# Patient Record
Sex: Female | Born: 1965 | Race: Black or African American | Hispanic: No | Smoking: Current every day smoker
Health system: Southern US, Community
[De-identification: ages and names within clinical notes are randomized; demographics above are authoritative.]

## PROBLEM LIST (undated history)

## (undated) DIAGNOSIS — C07 Malignant neoplasm of parotid gland: Secondary | ICD-10-CM

## (undated) DIAGNOSIS — Z9289 Personal history of other medical treatment: Secondary | ICD-10-CM

## (undated) DIAGNOSIS — C089 Malignant neoplasm of major salivary gland, unspecified: Secondary | ICD-10-CM

## (undated) DIAGNOSIS — Z923 Personal history of irradiation: Secondary | ICD-10-CM

## (undated) DIAGNOSIS — F32A Depression, unspecified: Secondary | ICD-10-CM

## (undated) DIAGNOSIS — F329 Major depressive disorder, single episode, unspecified: Secondary | ICD-10-CM

## (undated) DIAGNOSIS — F419 Anxiety disorder, unspecified: Secondary | ICD-10-CM

## (undated) DIAGNOSIS — F17210 Nicotine dependence, cigarettes, uncomplicated: Secondary | ICD-10-CM

## (undated) DIAGNOSIS — H9190 Unspecified hearing loss, unspecified ear: Secondary | ICD-10-CM

## (undated) DIAGNOSIS — K219 Gastro-esophageal reflux disease without esophagitis: Secondary | ICD-10-CM

## (undated) DIAGNOSIS — R42 Dizziness and giddiness: Secondary | ICD-10-CM

## (undated) DIAGNOSIS — G4733 Obstructive sleep apnea (adult) (pediatric): Secondary | ICD-10-CM

## (undated) DIAGNOSIS — D649 Anemia, unspecified: Secondary | ICD-10-CM

## (undated) DIAGNOSIS — D219 Benign neoplasm of connective and other soft tissue, unspecified: Secondary | ICD-10-CM

## (undated) HISTORY — DX: Nicotine dependence, cigarettes, uncomplicated: F17.210

## (undated) HISTORY — DX: Obstructive sleep apnea (adult) (pediatric): G47.33

## (undated) HISTORY — DX: Depression, unspecified: F32.A

## (undated) HISTORY — DX: Anemia, unspecified: D64.9

## (undated) HISTORY — DX: Anxiety disorder, unspecified: F41.9

## (undated) HISTORY — DX: Gastro-esophageal reflux disease without esophagitis: K21.9

## (undated) HISTORY — DX: Malignant neoplasm of parotid gland: C07

## (undated) HISTORY — DX: Dizziness and giddiness: R42

## (undated) HISTORY — PX: PAROTIDECTOMY: SHX2163

## (undated) HISTORY — DX: Unspecified hearing loss, unspecified ear: H91.90

## (undated) HISTORY — DX: Malignant neoplasm of major salivary gland, unspecified: C08.9

## (undated) HISTORY — PX: TUBAL LIGATION: SHX77

## (undated) HISTORY — DX: Benign neoplasm of connective and other soft tissue, unspecified: D21.9

## (undated) HISTORY — PX: COLONOSCOPY: SHX174

## (undated) HISTORY — PX: CYSTOSCOPY: SUR368

## (undated) HISTORY — PX: LAPAROSCOPIC ASSISTED VAGINAL HYSTERECTOMY: SHX5398

---
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
## 1998-09-11 ENCOUNTER — Emergency Department (HOSPITAL_COMMUNITY): Admission: EM | Admit: 1998-09-11 | Discharge: 1998-09-11 | Payer: Self-pay | Admitting: Emergency Medicine

## 1998-12-28 ENCOUNTER — Emergency Department (HOSPITAL_COMMUNITY): Admission: EM | Admit: 1998-12-28 | Discharge: 1998-12-28 | Payer: Self-pay | Admitting: Emergency Medicine

## 1998-12-28 ENCOUNTER — Encounter: Payer: Self-pay | Admitting: Emergency Medicine

## 1999-07-23 ENCOUNTER — Emergency Department (HOSPITAL_COMMUNITY): Admission: EM | Admit: 1999-07-23 | Discharge: 1999-07-23 | Payer: Self-pay | Admitting: *Deleted

## 2000-11-17 ENCOUNTER — Emergency Department (HOSPITAL_COMMUNITY): Admission: EM | Admit: 2000-11-17 | Discharge: 2000-11-17 | Payer: Self-pay | Admitting: Emergency Medicine

## 2000-11-17 ENCOUNTER — Encounter: Payer: Self-pay | Admitting: Emergency Medicine

## 2001-08-08 ENCOUNTER — Encounter: Payer: Self-pay | Admitting: Emergency Medicine

## 2001-08-08 ENCOUNTER — Emergency Department (HOSPITAL_COMMUNITY): Admission: EM | Admit: 2001-08-08 | Discharge: 2001-08-08 | Payer: Self-pay | Admitting: Emergency Medicine

## 2002-04-08 ENCOUNTER — Emergency Department (HOSPITAL_COMMUNITY): Admission: EM | Admit: 2002-04-08 | Discharge: 2002-04-08 | Payer: Self-pay | Admitting: Emergency Medicine

## 2002-04-08 ENCOUNTER — Encounter: Payer: Self-pay | Admitting: Emergency Medicine

## 2003-02-09 ENCOUNTER — Emergency Department (HOSPITAL_COMMUNITY): Admission: EM | Admit: 2003-02-09 | Discharge: 2003-02-09 | Payer: Self-pay | Admitting: Emergency Medicine

## 2003-07-25 ENCOUNTER — Emergency Department (HOSPITAL_COMMUNITY): Admission: EM | Admit: 2003-07-25 | Discharge: 2003-07-25 | Payer: Self-pay | Admitting: Emergency Medicine

## 2003-08-17 ENCOUNTER — Other Ambulatory Visit: Admission: RE | Admit: 2003-08-17 | Discharge: 2003-08-17 | Payer: Self-pay | Admitting: Family Medicine

## 2004-06-29 ENCOUNTER — Emergency Department (HOSPITAL_COMMUNITY): Admission: EM | Admit: 2004-06-29 | Discharge: 2004-06-29 | Payer: Self-pay | Admitting: Family Medicine

## 2004-09-18 ENCOUNTER — Emergency Department (HOSPITAL_COMMUNITY): Admission: EM | Admit: 2004-09-18 | Discharge: 2004-09-18 | Payer: Self-pay | Admitting: Emergency Medicine

## 2005-03-02 ENCOUNTER — Emergency Department (HOSPITAL_COMMUNITY): Admission: EM | Admit: 2005-03-02 | Discharge: 2005-03-02 | Payer: Self-pay | Admitting: Family Medicine

## 2005-07-07 ENCOUNTER — Emergency Department (HOSPITAL_COMMUNITY): Admission: EM | Admit: 2005-07-07 | Discharge: 2005-07-08 | Payer: Self-pay | Admitting: Emergency Medicine

## 2006-01-02 ENCOUNTER — Ambulatory Visit (HOSPITAL_COMMUNITY): Admission: RE | Admit: 2006-01-02 | Discharge: 2006-01-02 | Payer: Self-pay | Admitting: Family Medicine

## 2006-01-16 ENCOUNTER — Encounter: Admission: RE | Admit: 2006-01-16 | Discharge: 2006-01-16 | Payer: Self-pay | Admitting: Family Medicine

## 2006-03-31 ENCOUNTER — Ambulatory Visit: Payer: Self-pay | Admitting: Family Medicine

## 2006-04-02 ENCOUNTER — Ambulatory Visit: Payer: Self-pay | Admitting: Family Medicine

## 2006-04-14 ENCOUNTER — Ambulatory Visit: Payer: Self-pay | Admitting: *Deleted

## 2006-11-19 ENCOUNTER — Encounter (INDEPENDENT_AMBULATORY_CARE_PROVIDER_SITE_OTHER): Payer: Self-pay | Admitting: *Deleted

## 2007-01-06 ENCOUNTER — Emergency Department (HOSPITAL_COMMUNITY): Admission: EM | Admit: 2007-01-06 | Discharge: 2007-01-06 | Payer: Self-pay | Admitting: Emergency Medicine

## 2007-06-03 ENCOUNTER — Emergency Department (HOSPITAL_COMMUNITY): Admission: EM | Admit: 2007-06-03 | Discharge: 2007-06-03 | Payer: Self-pay | Admitting: Emergency Medicine

## 2007-06-29 ENCOUNTER — Ambulatory Visit: Payer: Self-pay | Admitting: Family Medicine

## 2007-06-29 ENCOUNTER — Encounter (INDEPENDENT_AMBULATORY_CARE_PROVIDER_SITE_OTHER): Payer: Self-pay | Admitting: Family Medicine

## 2007-07-07 ENCOUNTER — Encounter: Admission: RE | Admit: 2007-07-07 | Discharge: 2007-07-07 | Payer: Self-pay | Admitting: Internal Medicine

## 2007-07-28 ENCOUNTER — Encounter: Payer: Self-pay | Admitting: Internal Medicine

## 2007-07-28 ENCOUNTER — Ambulatory Visit: Payer: Self-pay | Admitting: Internal Medicine

## 2007-08-03 ENCOUNTER — Ambulatory Visit (HOSPITAL_COMMUNITY): Admission: RE | Admit: 2007-08-03 | Discharge: 2007-08-03 | Payer: Self-pay | Admitting: Internal Medicine

## 2007-10-27 ENCOUNTER — Ambulatory Visit: Payer: Self-pay | Admitting: Internal Medicine

## 2008-01-04 ENCOUNTER — Ambulatory Visit (HOSPITAL_COMMUNITY): Admission: RE | Admit: 2008-01-04 | Discharge: 2008-01-05 | Payer: Self-pay | Admitting: Obstetrics and Gynecology

## 2008-01-04 ENCOUNTER — Encounter (INDEPENDENT_AMBULATORY_CARE_PROVIDER_SITE_OTHER): Payer: Self-pay | Admitting: Obstetrics and Gynecology

## 2008-07-07 ENCOUNTER — Encounter: Admission: RE | Admit: 2008-07-07 | Discharge: 2008-07-07 | Payer: Self-pay | Admitting: Family Medicine

## 2008-07-11 ENCOUNTER — Ambulatory Visit: Payer: Self-pay | Admitting: Internal Medicine

## 2009-03-17 ENCOUNTER — Emergency Department (HOSPITAL_COMMUNITY): Admission: EM | Admit: 2009-03-17 | Discharge: 2009-03-17 | Payer: Self-pay | Admitting: Emergency Medicine

## 2010-07-17 NOTE — Op Note (Signed)
NAME:  Shelley Mason, Shelley Mason NO.:  [id number]   MEDICAL RECORD NO.:  [id number]          PATIENT TYPE:  OIB   LOCATION:  9318                          FACILITY:  [facility]   PHYSICIAN:  Juluis Mire, M.D.   DATE OF BIRTH:  [birth year]   DATE OF PROCEDURE:  01/04/2008  DATE OF DISCHARGE:                               OPERATIVE REPORT   PREOPERATIVE DIAGNOSIS:  Uterine fibroids.   POSTOPERATIVE DIAGNOSIS:  Uterine fibroids.   PROCEDURE:  Laparoscopic-assisted vaginal hysterectomy.  Cystoscopy.   SURGEON:  Juluis Mire, MD   ASSISTANT:  Duke Salvia. Marcelle Overlie, MD   ESTIMATED BLOOD LOSS:  600-700 mL.   PACKS AND DRAINS:  None.   INTRAOPERATIVE BLOOD PLACED:  None.   COMPLICATIONS:  None.   Indications are as dictated in history and physical.   PROCEDURE:  The patient was taken to the OR, placed in supine position.  After satisfactory level of general endotracheal anesthesia was  obtained, the patient was placed in dorsal supine position using the  Allen stirrups.  The abdomen, perineum, and vagina were prepped out with  Betadine.  Bladder was emptied by catheterization.  A Hulka tenaculum  was put in place and secured.  The patient was draped in sterile field.  A subumbilical incision was made with a knife, carried through the  subcutaneous tissue.  Fascia was entered sharply.  Incision fashioned  laterally.  Muscles were separated.  The peritoneum was entered with  blunt pressure.  The open laparoscopic trocar was put in place and  secured.  The abdomen was insufflated with carbon dioxide.  Visualization revealed no evidence of injury to adjacent organs.  There  were some upper abdominal omental adhesions that were left in place.  Appendix was visualized and noted to be normal.  Liver and tip of the  gallbladder were clear.  At this point of time, a 5-mm trocar was put in  place in the suprapubic area under direct visualization.  Visualization  revealed the uterus  has been enlarged with multiple fibroids.  Tubes  were unremarkable.  Cul-de-sac was clear.  Using the EndoSeal, first the  right utero-ovarian pedicle was cauterized and incised.  The right round  ligament was cauterized, incised.  Part of the right round ligament was  cauterized, incised.  We then went to the left side.  The left utero-  ovarian pedicle was cauterized, incised.  The left round ligament was  cauterized, incised.  Part of the round ligament was cauterized,  incised.  We had good freeing of the uterus at this point in time.  The  decision was to go vaginally.   The patient's legs were repositioned.  The laparoscope had been removed  and the abdomen was insufflated with carbon dioxide.  The Hulka  tenaculum was taken out.  Weighted speculum was placed in the vaginal  vault.  Cervix was grasped with Christella Hartigan tenaculum.  The cul-de-sac was  entered sharply.  Both uterosacral ligaments were clamped, cut, and  suture ligated with 0 Vicryl.  Reflection of vaginal mucosa anteriorly  was incised and bladder was  dissected superiorly.  Paracervical tissue  was then clamped, cut, and suture ligated with 0 Vicryl.  We had a hard  time dissecting the bladder up, but we used sharp dissection.  At this  point in time, we felt like we actually entered the uterus.  We had some  brisk bleeding.  Went ahead and clamped the uterine vessels, cut these,  and suture ligated those.  Incision began to morcellate the uterus.  We  started first the cervix and cervical stump was removed.  We then  brought the posterior part of the uterus around and began to incise and  removed with fibroids and large stones.  Eventually, we were able to  deliver the fundus.  We then went around posteriorly and identified the  bladder flap, incised that with a retractor in place.  All pedicles were  clamped and cut, and the fundus was removed from the operative field.  Held pedicle was secured with suture ligature of 0  Vicryl.  Foley was  placed at straight drain, clear urine was obtained.  Areas of bleeding  __________ controlled with figure-of-eights of 0 Vicryl.  Vaginal mucosa  was then reapproximated in vertical fashion with interrupted figure-of-  eights of 2-0 Monocryl.  At this point in time, cystoscopy was  performed.  There was no evidence of injury to the bladder.  The patient  began indigo carmine.  Spillage of dye from both ureteral orifices were  identified.  Foley was placed at straight drain.   The patient's legs were repositioned.  Laparoscope was reintroduced.  We  irrigated the pelvis, had some minimal bleeding.  This  controlled using  the EndoSeal.  Then, we irrigated, we had good hemostasis.  Both ovaries  were hemostatically intact.  Urine output was blue-tinged, but clear.  The abdomen was inflated with carbon dioxide.  All trocars were removed.  Subumbilical fascia was closed with figure-of-eight of 0 Vicryl.  Suprapubic incision was closed with Dermabond.  Sponge, instrument, and  needle count were reported correct by circulating nurse.  The patient  tolerated the procedure well, was turned to recovery room in good  condition.      Juluis Mire, M.D.  Electronically Signed     JSM/MEDQ  D:  01/04/2008  T:  01/04/2008  Job:  [id number]

## 2010-07-17 NOTE — Discharge Summary (Signed)
NAME:  Shelley Mason, STJAMES NO.:  [id number]   MEDICAL RECORD NO.:  [id number]          PATIENT TYPE:  OIB   LOCATION:  9318                          FACILITY:  [facility]   PHYSICIAN:  Juluis Mire, M.D.   DATE OF BIRTH:  08-19-1965   DATE OF ADMISSION:  01/04/2008  DATE OF DISCHARGE:  01/05/2008                               DISCHARGE SUMMARY   ADMITTING DIAGNOSIS:  Uterine fibroids.   DISCHARGE DIAGNOSIS:  Uterine fibroids.   OPERATIVE PROCEDURE:  Laparoscopic-assisted vaginal hysterectomy.  Cystoscopy.  For complete history and physical, please see dictated  note.   HOSPITAL COURSE:  The patient underwent the above-noted surgery.  We did  do cystoscopy to evaluate the bladder and ureters both of which were  normal.  Postop did well.  First postop day, was ambulating without  difficulty.  Foley had been discontinued and she was voiding without  difficulty.  She was tolerating regular diet.  Bowel sounds were active.  She was passing flatus.  All incisions were clear.  She was having no  vaginal bleeding.  Postop hemoglobin was 9.6, white count 15,500.  She  was afebrile, stable vital signs, was discharged home at that time.   In terms of complications, none were encountered during her stay in the  hospital.  The patient was discharged home in stable condition.   DISPOSITION:  The patient discharged home on Tylox as needed for pain.  Limitation include vaginal entrance, heavy lifting or driving.  She is  to watch for signs of infection, nausea, or vomiting.  Any active  vaginal bleeding should be reported.  Any increase in pain should also  be reported.  Discussed signs and symptoms of deep venous thrombosis and  pulmonary embolus.  The patient will be called from the office and  arrange followup in the office in 1 week.      Juluis Mire, M.D.  Electronically Signed     JSM/MEDQ  D:  01/05/2008  T:  01/05/2008  Job:  [id number]

## 2010-07-17 NOTE — H&P (Signed)
NAME:  Shelley Mason, ATHA NO.:  [id number]   MEDICAL RECORD NO.:  [id number]          PATIENT TYPE:  OIB   LOCATION:                                [facility]   PHYSICIAN:  Juluis Mire, M.D.   DATE OF BIRTH:  1965-04-14   DATE OF ADMISSION:  01/04/2008  DATE OF DISCHARGE:                              HISTORY & PHYSICAL   HISTORY OF PRESENT ILLNESS:  The patient is a 45 year old gravida 3,  para 3 married white female who presents for laparoscopic-assisted  vaginal hysterectomy.   In relation to present admission, the patient has been having increasing  menstrual flow.  Her cycles do remain regular.  They are prolonged  lasting up to 9 days.  They are extremely heavy, changing pads and  tampons every 1-2 hours with clots.  She is experiencing some increasing  pain discomfort.  Her hemoglobin has remained stable on iron sulfate  supplementation, although she has been anemic in the past.  Ultrasound  evaluation revealed 2 definitive fibroids one measuring approximately 5  cm, the second one approximately 3.5 cm.  Ovaries appeared to be normal.  The patient does have a history of tobacco use, therefore birth control  pills were contraindicated.  We discussed the possibility of Depo-  Provera or Mirena IUD.  We also discussed radiological embolization.  The patient was in favor of hysterectomy for which she presents at the  present time.  She did have a Pap smear done at [facility] that was  within normal limits and no history of abnormal cervical cytology.   ALLERGIES:  She is allergic to PENICILLIN.   MEDICATIONS:  None.   PAST MEDICAL HISTORY:  She does have a history of anemia, otherwise  usual childhood diseases and no significant sequelae.   SURGICAL HISTORY:  She has had 3 cesarean sections.  Had a tubal done  with the last one.   SOCIAL HISTORY:  She does have a half-pack per day tobacco use and one  drink per day.   FAMILY HISTORY:  There is a  family history of heart disease and asthma.  Also, a family history of diabetes and hypertension.   REVIEW OF SYSTEMS:  Noncontributory.   PHYSICAL EXAMINATION:  VITAL SIGNS:  The patient is afebrile, stable  vital signs.  HEENT:  The patient is normocephalic.  Pupils are equal, round, and  reactive to light and accommodation.  Extraocular movements were intact.  Sclerae and conjunctivae are clear.  Oropharynx is clear.  NECK:  Without thyromegaly.  BREASTS:  Not examined.  LUNGS:  Clear.  CARDIOVASCULAR SYSTEM:  Regular rate without murmurs or gallops.  ABDOMEN:  Benign.  Well-healed low transverse incisions.  PELVIC:  Normal external genitalia.  Vaginal mucosa is clear.  Cervix is  unremarkable.  Uterus approximately 9-10 weeks in size, irregular  consistent with known fibroids.  Adnexa unremarkable.  EXTREMITIES:  Trace edema.  NEUROLOGIC:  Grossly within normal limits.   IMPRESSION:  Symptomatic fibroids.   PLAN:  Again, alternatives have been discussed.  The patient is in favor  of laparoscopic-assisted vaginal  hysterectomy.  The risk and benefits  explained including the risk of infection.  Risk of hemorrhage that  could require transfusion with the risk of AIDS or hepatitis.  The risk  of injury to adjacent organs including bladder, bowel, ureters that  could require further exploratory surgery.  Risk of deep venous  thrombosis and pulmonary emboli.  The patient expressed understanding  and potential risk and complications and again does understand  alternatives.      Juluis Mire, M.D.  Electronically Signed     JSM/MEDQ  D:  01/04/2008  T:  01/04/2008  Job:  [id number]

## 2010-08-29 ENCOUNTER — Inpatient Hospital Stay (INDEPENDENT_AMBULATORY_CARE_PROVIDER_SITE_OTHER)
Admission: RE | Admit: 2010-08-29 | Discharge: 2010-08-29 | Disposition: A | Discharge status: Home / Self Care | Payer: BC Managed Care – PPO | Source: Ambulatory Visit | Attending: Family Medicine | Admitting: Family Medicine

## 2010-08-29 DIAGNOSIS — J029 Acute pharyngitis, unspecified: Secondary | ICD-10-CM

## 2010-11-27 LAB — POCT URINALYSIS DIP (DEVICE)
Bilirubin Urine: NEGATIVE
Ketones, ur: NEGATIVE
Operator id: 247071
Specific Gravity, Urine: 1.02

## 2010-12-04 LAB — CBC
HCT: 38.3
Hemoglobin: 13
MCHC: 34
MCHC: 33.6
MCV: 93.4
MCV: 94.3
Platelets: 238
RBC: 4.1
RBC: 3.03 — ABNORMAL LOW
RDW: 13.9
WBC: 7.5

## 2010-12-11 LAB — POCT RAPID STREP A: Streptococcus, Group A Screen (Direct): POSITIVE — AB

## 2011-05-15 ENCOUNTER — Emergency Department (INDEPENDENT_AMBULATORY_CARE_PROVIDER_SITE_OTHER)
Admission: EM | Admit: 2011-05-15 | Discharge: 2011-05-15 | Disposition: A | Discharge status: Home Health | Payer: Managed Care, Other (non HMO) | Source: Home / Self Care | Attending: Emergency Medicine | Admitting: Emergency Medicine

## 2011-05-15 ENCOUNTER — Encounter (HOSPITAL_COMMUNITY): Payer: Self-pay | Admitting: Emergency Medicine

## 2011-05-15 ENCOUNTER — Emergency Department (INDEPENDENT_AMBULATORY_CARE_PROVIDER_SITE_OTHER): Payer: Managed Care, Other (non HMO)

## 2011-05-15 DIAGNOSIS — R599 Enlarged lymph nodes, unspecified: Secondary | ICD-10-CM

## 2011-05-15 DIAGNOSIS — R591 Generalized enlarged lymph nodes: Secondary | ICD-10-CM

## 2011-05-15 LAB — CBC
HCT: 39.3 % (ref 36.0–46.0)
MCHC: 33.8 g/dL (ref 30.0–36.0)
Platelets: 235 10*3/uL (ref 150–400)
RDW: 13.9 % (ref 11.5–15.5)
WBC: 9.7 10*3/uL (ref 4.0–10.5)

## 2011-05-15 LAB — DIFFERENTIAL
Basophils Relative: 0 % (ref 0–1)
Lymphs Abs: 3.3 10*3/uL (ref 0.7–4.0)
Monocytes Absolute: 0.4 10*3/uL (ref 0.1–1.0)
Neutro Abs: 5.9 10*3/uL (ref 1.7–7.7)
Neutrophils Relative %: 61 % (ref 43–77)

## 2011-05-15 MED ORDER — AZITHROMYCIN 250 MG PO TABS: ORAL_TABLET | ORAL | Status: AC

## 2011-05-15 NOTE — ED Provider Notes (Signed)
Chief Complaint  Patient presents with  . Cyst    History of Present Illness:   Shelley Mason is a 46 year old female who has had a two-week history of a painful nodule in the left anterior cervical triangle. This is tender to touch. She denies any skin infection on the face, neck, or scalp. She has had no ear pain or drainage, no nasal congestion or drainage, and no sore throat or intraoral lesions. She denies any coughing or hemoptysis. She is a cigarette smoker. She's had no fever, chills, sweats, or weight loss. She denies any lymphadenopathy anywhere else. She has no history of a cat scratch or exposure to cats or kittens.  Review of Systems:  Other than noted above, the patient denies any of the following symptoms. Systemic:  No fever, chills, sweats, fatigue, myalgias, headache, or anorexia. Eye:  No redness, pain or drainage. ENT:  No earache, nasal congestion, rhinorrhea, sinus pressure, or sore throat. Lungs:  No cough, sputum production, wheezing, shortness of breath. Or chest pain. GI:  No nausea, vomiting, abdominal pain or diarrhea. Skin:  No rash or itching.  PMFSH:  Past medical history, family history, social history, meds, and allergies were reviewed.  Physical Exam:   Vital signs:  BP 109/76  Pulse 91  Temp(Src) 98.6 F (37 C) (Oral)  Resp 17  SpO2 100% General:  Alert, in no distress. Eye:  No conjunctival injection or drainage. ENT:  TMs and canals were normal, without erythema or inflammation.  Nasal mucosa was clear and uncongested, without drainage.  Mucous membranes were moist.  Pharynx was clear, without exudate or drainage.  There were no oral ulcerations or lesions. No intraoral lesions. Neck:  Supple, there is a 1.5 cm, tender lymph node in the left anterior cervical triangle. There were no other masses in the neck including the occipital area at the hairline. The thyroid was normal. Lungs:  No respiratory distress.  Lungs were clear to auscultation, without  wheezes, rales or rhonchi.  Breath sounds were clear and equal bilaterally. Heart:  Regular rhythm, without gallops, murmers or rubs. Abdomen: Soft, nontender, no organomegaly or mass. Lymph nodes: No other lymph nodes in any of the other lymph node areas. Skin:  Clear, warm, and dry, without rash or lesions.  Labs:   Results for orders placed during the hospital encounter of 05/15/11  CBC      Component Value Range   WBC 9.7  4.0 - 10.5 (K/uL)   RBC 4.21  3.87 - 5.11 (MIL/uL)   Hemoglobin 13.3  12.0 - 15.0 (g/dL)   HCT 16.1  09.6 - 04.5 (%)   MCV 93.3  78.0 - 100.0 (fL)   MCH 31.6  26.0 - 34.0 (pg)   MCHC 33.8  30.0 - 36.0 (g/dL)   RDW 40.9  81.1 - 91.4 (%)   Platelets 235  150 - 400 (K/uL)  DIFFERENTIAL      Component Value Range   Neutrophils Relative 61  43 - 77 (%)   Neutro Abs 5.9  1.7 - 7.7 (K/uL)   Lymphocytes Relative 34  12 - 46 (%)   Lymphs Abs 3.3  0.7 - 4.0 (K/uL)   Monocytes Relative 4  3 - 12 (%)   Monocytes Absolute 0.4  0.1 - 1.0 (K/uL)   Eosinophils Relative 1  0 - 5 (%)   Eosinophils Absolute 0.1  0.0 - 0.7 (K/uL)   Basophils Relative 0  0 - 1 (%)   Basophils Absolute 0.0  0.0 - 0.1 (K/uL)     Radiology:  Dg Chest 2 View  05/15/2011  *RADIOLOGY REPORT*  Clinical Data: Unexplained lymphadenopathy in the neck.  Cigarette smoker.  CHEST - 2 VIEW  Comparison: None.  Findings: The heart, mediastinal, and hilar contours are normal. The lungs are well-expanded and clear. Normal pulmonary vascularity.  No airspace disease, mass, pleural effusion, or evidence of lymphadenopathy.  The bones are unremarkable.  IMPRESSION: No acute cardiopulmonary disease.  Original Report Authenticated By: Britta Mccreedy, M.D.    Assessment:   Diagnoses that have been ruled out:  None  Diagnoses that are still under consideration:  None  Final diagnoses:  Lymphadenopathy - this could be a reactive lymph node due to infection or a lymphoma.       Plan:   1.  The following meds were  prescribed:   New Prescriptions   AZITHROMYCIN (ZITHROMAX Z-PAK) 250 MG TABLET    Take as directed.   2.  The patient was instructed in symptomatic care and handouts were given. 3.  The patient was told to return if becoming worse in any way, if no better in 3 or 4 days, and given some red flag symptoms that would indicate earlier return. 4.  The patient was told that this would potentially be a malignancy and that was followup was extremely important. She was given the name of Dr. Emmit Pomfret to followup with early next week.   Shelley Likes, MD 05/15/11 [phone]

## 2011-05-15 NOTE — Discharge Instructions (Signed)
Cervical Adenitis You have a swollen lymph gland in your neck. This commonly happens with Strep and virus infections, dental problems, insect bites, and injuries about the face, scalp, or neck. The lymph glands swell as the body fights the infection or heals the injury. Swelling and firmness typically lasts for several weeks after the infection or injury is healed. Rarely lymph glands can become swollen because of cancer or TB. Antibiotics are prescribed if there is evidence of an infection. Sometimes an infected lymph gland becomes filled with pus. This condition may require opening up the abscessed gland by draining it surgically. Most of the time infected glands return to normal within two weeks. Do not poke or squeeze the swollen lymph nodes. That may keep them from shrinking back to their normal size. If the lymph gland is still swollen after 2 weeks, further medical evaluation is needed.  SEEK IMMEDIATE MEDICAL CARE IF:  You have difficulty swallowing or breathing, increased swelling, severe pain, or a high fever.  Document Released: 02/18/2005 Document Revised: 02/07/2011 Document Reviewed: 08/10/2006 [facility] Patient Information 2012 [place], [place].Lymphadenopathy Lymphadenopathy means "disease of the lymph glands." But the term is usually used to describe swollen or enlarged lymph glands, also called lymph nodes. These are the bean-shaped organs found in many locations including the neck, underarm, and groin. Lymph glands are part of the immune system, which fights infections in your body. Lymphadenopathy can occur in just one area of the body, such as the neck, or it can be generalized, with lymph node enlargement in several areas. The nodes found in the neck are the most common sites of lymphadenopathy. CAUSES  When your immune system responds to germs (such as viruses or bacteria ), infection-fighting cells and fluid build up. This causes the glands to grow in size. This is usually not  something to worry about. Sometimes, the glands themselves can become infected and inflamed. This is called lymphadenitis. Enlarged lymph nodes can be caused by many diseases:  Bacterial disease, such as strep throat or a skin infection.   Viral disease, such as a common cold.   Other germs, such as lyme disease, tuberculosis, or sexually transmitted diseases.   Cancers, such as lymphoma (cancer of the lymphatic system) or leukemia (cancer of the white blood cells).   Inflammatory diseases such as lupus or rheumatoid arthritis.   Reactions to medications.  Many of the diseases above are rare, but important. This is why you should see your caregiver if you have lymphadenopathy. SYMPTOMS   Swollen, enlarged lumps in the neck, back of the head or other locations.   Tenderness.   Warmth or redness of the skin over the lymph nodes.   Fever.  DIAGNOSIS  Enlarged lymph nodes are often near the source of infection. They can help healthcare providers diagnose your illness. For instance:   Swollen lymph nodes around the jaw might be caused by an infection in the mouth.   Enlarged glands in the neck often signal a throat infection.   Lymph nodes that are swollen in more than one area often indicate an illness caused by a virus.  Your caregiver most likely will know what is causing your lymphadenopathy after listening to your history and examining you. Blood tests, x-rays or other tests may be needed. If the cause of the enlarged lymph node cannot be found, and it does not go away by itself, then a biopsy may be needed. Your caregiver will discuss this with you. TREATMENT  Treatment for your  enlarged lymph nodes will depend on the cause. Many times the nodes will shrink to normal size by themselves, with no treatment. Antibiotics or other medicines may be needed for infection. Only take over-the-counter or prescription medicines for pain, discomfort or fever as directed by your caregiver. HOME  CARE INSTRUCTIONS  Swollen lymph glands usually return to normal when the underlying medical condition goes away. If they persist, contact your health-care provider. He/she might prescribe antibiotics or other treatments, depending on the diagnosis. Take any medications exactly as prescribed. Keep any follow-up appointments made to check on the condition of your enlarged nodes.  SEEK MEDICAL CARE IF:   Swelling lasts for more than two weeks.   You have symptoms such as weight loss, night sweats, fatigue or fever that does not go away.   The lymph nodes are hard, seem fixed to the skin or are growing rapidly.   Skin over the lymph nodes is red and inflamed. This could mean there is an infection.  SEEK IMMEDIATE MEDICAL CARE IF:   Fluid starts leaking from the area of the enlarged lymph node.   You develop a fever of 102 F (38.9 C) or greater.   Severe pain develops (not necessarily at the site of a large lymph node).   You develop chest pain or shortness of breath.   You develop worsening abdominal pain.  MAKE SURE YOU:   Understand these instructions.   Will watch your condition.   Will get help right away if you are not doing well or get worse.  Document Released: 11/28/2007 Document Revised: 02/07/2011 Document Reviewed: 11/28/2007 [facility] Patient Information 2012 [place], [place].Swollen Lymph Nodes The lymphatic system filters fluid from around cells. It is like a system of blood vessels. These channels carry lymph instead of blood. The lymphatic system is an important part of the immune (disease fighting) system. When people talk about "swollen glands in the neck," they are usually talking about swollen lymph nodes. The lymph nodes are like the little traps for infection. You and your caregiver may be able to feel lymph nodes, especially swollen nodes, in these common areas: the groin (inguinal area), armpits (axilla), and above the clavicle (supraclavicular). You may also feel  them in the neck (cervical) and the back of the head just above the hairline (occipital). Swollen glands occur when there is any condition in which the body responds with an allergic type of reaction. For instance, the glands in the neck can become swollen from insect bites or any type of minor infection on the head. These are very noticeable in children with only minor problems. Lymph nodes may also become swollen when there is a tumor or problem with the lymphatic system, such as Hodgkin's disease. TREATMENT   Most swollen glands do not require treatment. They can be observed (watched) for a short period of time, if your caregiver feels it is necessary. Most of the time, observation is not necessary.   Antibiotics (medicines that kill germs) may be prescribed by your caregiver. Your caregiver may prescribe these if he or she feels the swollen glands are due to a bacterial (germ) infection. Antibiotics are not used if the swollen glands are caused by a virus.  HOME CARE INSTRUCTIONS   Take medications as directed by your caregiver. Only take over-the-counter or prescription medicines for pain, discomfort, or fever as directed by your caregiver.  SEEK MEDICAL CARE IF:   If you begin to run a temperature greater than 102 F (38.9 C),  or as your caregiver suggests.  MAKE SURE YOU:   Understand these instructions.   Will watch your condition.   Will get help right away if you are not doing well or get worse.  Document Released: 02/08/2002 Document Revised: 02/07/2011 Document Reviewed: 02/18/2005 [facility] Patient Information 2012 [place], [place].

## 2011-05-15 NOTE — ED Notes (Signed)
Pt here with tender knot behind left ear  With radiating pain to left ear and numbness to jaw that worsens when lying down or turning head.sx started x 2weeks ago but has gotten bigger according to pt.denies blurred vision or dizziness.pt tried warm compress and aleve for pain

## 2011-05-23 ENCOUNTER — Other Ambulatory Visit: Payer: Self-pay | Admitting: Otolaryngology

## 2011-05-23 ENCOUNTER — Other Ambulatory Visit (HOSPITAL_COMMUNITY)
Admission: RE | Admit: 2011-05-23 | Discharge: 2011-05-23 | Disposition: A | Discharge status: Home / Self Care | Payer: Managed Care, Other (non HMO) | Source: Ambulatory Visit | Attending: Otolaryngology | Admitting: Otolaryngology

## 2011-05-23 DIAGNOSIS — R599 Enlarged lymph nodes, unspecified: Secondary | ICD-10-CM | POA: Insufficient documentation

## 2011-05-24 ENCOUNTER — Other Ambulatory Visit: Payer: Self-pay | Admitting: Otolaryngology

## 2011-05-24 DIAGNOSIS — R599 Enlarged lymph nodes, unspecified: Secondary | ICD-10-CM

## 2011-05-28 ENCOUNTER — Other Ambulatory Visit: Payer: Managed Care, Other (non HMO)

## 2011-12-17 ENCOUNTER — Ambulatory Visit: Payer: Managed Care, Other (non HMO)

## 2011-12-18 ENCOUNTER — Ambulatory Visit (INDEPENDENT_AMBULATORY_CARE_PROVIDER_SITE_OTHER): Payer: Managed Care, Other (non HMO) | Admitting: Family Medicine

## 2011-12-18 VITALS — BP 130/84 | HR 108 | Temp 98.5°F | Resp 16 | Ht 64.5 in | Wt 205.0 lb

## 2011-12-18 DIAGNOSIS — Z0000 Encounter for general adult medical examination without abnormal findings: Secondary | ICD-10-CM

## 2011-12-18 NOTE — Progress Notes (Signed)
@[email or web address] @  Patient ID: Shelley Mason MRN: [id number], DOB: April 14, 1965, 46 y.o. Date of Encounter: 12/18/2011, 3:40 PM  Primary Physician: No primary provider on file.  Chief Complaint: Physical (CPE)  HPI: 46 y.o. y/o female with history of noted below here for CPE.  Doing well. Father died recently of prostate ca Refuses TdaP and flu shots Gets pelvic and breast exams at gyn Review of Systems: Consitutional: No fever, chills, fatigue, night sweats, lymphadenopathy, or weight changes. Eyes: No visual changes, eye redness, or discharge. ENT/Mouth: Ears: No otalgia, tinnitus, hearing loss, discharge. Nose: No congestion, rhinorrhea, sinus pain, or epistaxis. Throat: No sore throat, post nasal drip, or teeth pain. Cardiovascular: No CP, palpitations, diaphoresis, DOE, edema, orthopnea, PND. Respiratory: No cough, hemoptysis, SOB, or wheezing. Gastrointestinal: No anorexia, dysphagia, reflux, pain, nausea, vomiting, hematemesis, diarrhea, constipation, BRBPR, or melena. Breast: No discharge, pain, swelling, or mass. Genitourinary: No dysuria, frequency, urgency, hematuria, incontinence, nocturia, amenorrhea, vaginal discharge, pruritis, burning, abnormal bleeding, or pain. Musculoskeletal: No decreased ROM, myalgias, stiffness, joint swelling, or weakness. Skin: No rash, erythema, lesion changes, pain, warmth, jaundice, or pruritis. Neurological: No headache, dizziness, syncope, seizures, tremors, memory loss, coordination problems, or paresthesias. Psychological: No anxiety, depression, hallucinations, SI/HI. Endocrine: No fatigue, polydipsia, polyphagia, polyuria, or known diabetes. All other systems were reviewed and are otherwise negative.  No past medical history on file.   No past surgical history on file.  Home Meds:  Prior to Admission medications   Not on File    Allergies:  Allergies  Allergen Reactions  . Codeine   . Penicillins     History   Social History    . Marital Status: Married    Spouse Name: N/A    Number of Children: N/A  . Years of Education: N/A   Occupational History  . Not on file.   Social History Main Topics  . Smoking status: Current Every Day Smoker  . Smokeless tobacco: Never Used  . Alcohol Use: Yes  . Drug Use: No  . Sexually Active: Yes    Birth Control/ Protection: None   Other Topics Concern  . Not on file   Social History Narrative  . No narrative on file    Family History  Problem Relation Age of Onset  . Hyperlipidemia Mother   . Hypertension Mother   . Heart failure Mother   . Prostate cancer Father   . Hyperlipidemia Sister     Physical Exam: Blood pressure 130/84, pulse 108, temperature 98.5 F (36.9 C), temperature source Oral, resp. rate 16, height 5' 4.5" (1.638 m), weight 205 lb (92.987 kg), SpO2 99.00%., Body mass index is 34.64 kg/(m^2). General: Well developed, well nourished, in no acute distress. HEENT: Normocephalic, atraumatic. Conjunctiva pink, sclera non-icteric. Pupils 2 mm constricting to 1 mm, round, regular, and equally reactive to light and accomodation. EOMI. Internal auditory canal clear. TMs with good cone of light and without pathology. Nasal mucosa pink. Nares are without discharge. No sinus tenderness. Oral mucosa pink. Dentition good. Pharynx without exudate.   Neck: Supple. Trachea midline. No thyromegaly. Full ROM. No lymphadenopathy. Lungs: Clear to auscultation bilaterally without wheezes, rales, or rhonchi. Breathing is of normal effort and unlabored. Cardiovascular: RRR with S1 S2. No murmurs, rubs, or gallops appreciated. Distal pulses 2+ symmetrically. No carotid or abdominal bruits Abdomen: Soft, non-tender, non-distended with normoactive bowel sounds. No hepatosplenomegaly or masses. No rebound/guarding. No CVA tenderness. Without hernias.  Musculoskeletal: Full range of motion and 5/5 strength throughout. Without swelling, atrophy,  tenderness, crepitus, or warmth.  Extremities without clubbing, cyanosis, or edema. Calves supple. Skin: Warm and moist without erythema, ecchymosis, wounds, or rash. Neuro: A+Ox3. CN II-XII grossly intact. Moves all extremities spontaneously. Full sensation throughout. Normal gait. DTR 2+ throughout upper and lower extremities. Finger to nose intact. Psych:  Responds to questions appropriately with a normal affect.    Assessment/Plan:  46 y.o. y/o female here for CPE for work clearance -  Signed, Elvina Sidle, MD 12/18/2011 3:40 PM

## 2012-02-05 ENCOUNTER — Emergency Department (INDEPENDENT_AMBULATORY_CARE_PROVIDER_SITE_OTHER)
Admission: EM | Admit: 2012-02-05 | Discharge: 2012-02-05 | Disposition: A | Discharge status: Home / Self Care | Payer: Managed Care, Other (non HMO) | Source: Home / Self Care | Attending: Emergency Medicine | Admitting: Emergency Medicine

## 2012-02-05 ENCOUNTER — Encounter (HOSPITAL_COMMUNITY): Payer: Self-pay | Admitting: Emergency Medicine

## 2012-02-05 DIAGNOSIS — J069 Acute upper respiratory infection, unspecified: Secondary | ICD-10-CM

## 2012-02-05 DIAGNOSIS — J039 Acute tonsillitis, unspecified: Secondary | ICD-10-CM

## 2012-02-05 LAB — POCT RAPID STREP A: Streptococcus, Group A Screen (Direct): NEGATIVE

## 2012-02-05 MED ORDER — CETIRIZINE HCL 10 MG PO TABS: 10.0000 mg | ORAL_TABLET | Freq: Every day | ORAL | Status: DC

## 2012-02-05 MED ORDER — SALINE NASAL SPRAY 0.65 % NA SOLN: 1.0000 | NASAL | Status: DC | PRN

## 2012-02-05 MED ORDER — CLARITHROMYCIN 500 MG PO TABS: 500.0000 mg | ORAL_TABLET | Freq: Two times a day (BID) | ORAL | Status: DC

## 2012-02-05 NOTE — ED Provider Notes (Signed)
History     CSN: [id number]  Arrival date & time 02/05/12  1121   First MD Initiated Contact with Patient 02/05/12 1228      Chief Complaint  Patient presents with  . Sore Throat  . Nasal Congestion    (Consider location/radiation/quality/duration/timing/severity/associated sxs/prior treatment) Patient is a 46 y.o. female presenting with pharyngitis. The history is provided by the patient.  Sore Throat This is a new problem. The current episode started more than 2 days ago. The problem has been gradually worsening. The symptoms are aggravated by swallowing. The symptoms are relieved by acetaminophen. She has tried acetaminophen (nyquil) for the symptoms. The treatment provided mild relief.  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  History reviewed. No pertinent past medical history.  History reviewed. No pertinent past surgical history.  Family History  Problem Relation Age of Onset  . Hyperlipidemia Mother   . Hypertension Mother   . Heart failure Mother   . Prostate cancer Father   . Hyperlipidemia Sister     History  Substance Use Topics  . Smoking status: Current Every Day Smoker  . Smokeless tobacco: Never Used  . Alcohol Use: Yes    OB History    Grav Para Term Preterm Abortions TAB SAB Ect Mult Living                  Review of Systems  Constitutional: Positive for fever and chills.  HENT: Positive for congestion, sore throat, rhinorrhea, mouth sores and postnasal drip.   Respiratory: Positive for cough.   Neurological: Positive for dizziness.  All other systems reviewed and are negative.    Allergies  Codeine and Penicillins  Home Medications   Current Outpatient Rx  Name  Route  Sig  Dispense  Refill  . CETIRIZINE HCL 10 MG PO TABS   Oral   Take 1 tablet (10 mg total) by mouth daily.   30 tablet   0   . CLARITHROMYCIN 500 MG PO TABS   Oral   Take 1 tablet (500 mg total) by mouth 2 (two) times daily.   20  tablet   0   . SALINE NASAL SPRAY 0.65 % NA SOLN   Nasal   Place 1 spray into the nose as needed for congestion.   30 mL   12     BP 136/91  Pulse 94  Temp 99.5 F (37.5 C) (Oral)  Resp 19  SpO2 100%  Physical Exam  Nursing note and vitals reviewed. Constitutional: She is oriented to person, place, and time. Vital signs are normal. She appears well-developed and well-nourished. She is active and cooperative.  HENT:  Head: Normocephalic.  Right Ear: External ear normal.  Left Ear: External ear normal.  Mouth/Throat: Uvula is midline and mucous membranes are normal. Oropharyngeal exudate, posterior oropharyngeal edema and posterior oropharyngeal erythema present.  Eyes: Conjunctivae normal and EOM are normal. Pupils are equal, round, and reactive to light. No scleral icterus.  Neck: Trachea normal and normal range of motion. Neck supple.       Tender lymph nodes  Cardiovascular: Normal rate, regular rhythm, normal heart sounds and intact distal pulses.   No murmur heard. Pulmonary/Chest: Effort normal and breath sounds normal.  Abdominal: Soft. Bowel sounds are normal. There is no tenderness.  Musculoskeletal: Normal range of motion.  Lymphadenopathy:    She has no cervical adenopathy.  Neurological: She is alert and oriented to person, place, and time. No cranial  nerve deficit or sensory deficit.  Skin: Skin is warm and dry. No rash noted.  Psychiatric: She has a normal mood and affect. Her speech is normal and behavior is normal. Judgment and thought content normal. Cognition and memory are normal.    ED Course  Procedures (including critical care time)   Labs Reviewed  POCT RAPID STREP A ([facility] ONLY)   No results found.   1. URI (upper respiratory infection)   2. Tonsillitis       MDM  Increase fluid intake, rest.  Begin Amoxicillin.  Begin saline nasal spray and cough suppressant at bedtime. Antihistamines of your choice (Claritin or Zyrtec).  Tylenol  or Motrin for fever/discomfort.  Followup with PCP if not improving 7 to 10 days.         Johnsie Kindred, NP 02/05/12 [phone]

## 2012-02-05 NOTE — ED Notes (Signed)
Reports sore throat and nasal and chest congestion for three days.  Patient has tried OTC medication.

## 2012-02-06 NOTE — ED Provider Notes (Signed)
Medical screening examination/treatment/procedure(s) were performed by non-physician practitioner and as supervising physician I was immediately available for consultation/collaboration.  Leslee Home, M.D.   Reuben Likes, MD 02/06/12 [phone]

## 2012-02-24 ENCOUNTER — Encounter: Payer: Self-pay | Admitting: *Deleted

## 2012-03-05 ENCOUNTER — Ambulatory Visit: Payer: Managed Care, Other (non HMO) | Admitting: Cardiology

## 2012-05-06 ENCOUNTER — Encounter (HOSPITAL_COMMUNITY): Payer: Self-pay | Admitting: *Deleted

## 2012-05-06 ENCOUNTER — Emergency Department (INDEPENDENT_AMBULATORY_CARE_PROVIDER_SITE_OTHER)
Admission: EM | Admit: 2012-05-06 | Discharge: 2012-05-06 | Disposition: A | Discharge status: Home / Self Care | Payer: Managed Care, Other (non HMO) | Source: Home / Self Care

## 2012-05-06 DIAGNOSIS — A088 Other specified intestinal infections: Secondary | ICD-10-CM

## 2012-05-06 DIAGNOSIS — A084 Viral intestinal infection, unspecified: Secondary | ICD-10-CM

## 2012-05-06 MED ORDER — ONDANSETRON 4 MG PO TBDP: 4.0000 mg | ORAL_TABLET | Freq: Three times a day (TID) | ORAL | Status: DC | PRN

## 2012-05-06 MED ORDER — DIPHENOXYLATE-ATROPINE 2.5-0.025 MG PO TABS: 1.0000 | ORAL_TABLET | Freq: Four times a day (QID) | ORAL | Status: DC | PRN

## 2012-05-06 NOTE — ED Notes (Signed)
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

## 2012-05-06 NOTE — ED Provider Notes (Signed)
History     CSN: [id number]  Arrival date & time 05/06/12  1222   First MD Initiated Contact with Patient 05/06/12 1316      Chief Complaint  Patient presents with  . Emesis    (Consider location/radiation/quality/duration/timing/severity/associated sxs/prior treatment) Patient is a 47 y.o. female presenting with vomiting.  Emesis Associated symptoms: abdominal pain, chills and diarrhea    This is a 47 year old female who presents with a complaint of nausea vomiting diarrhea and diffuse abdominal pain starting 2 days ago. 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 She's had some low-grade fevers and chills. No blood noticed in the vomitus or in stools Past Medical History  Diagnosis Date  . Anemia     history of  . Fibroids     history of uterine fibroids    Past Surgical History  Procedure Laterality Date  . Cystoscopy    . Laparoscopic assisted vaginal hysterectomy    . Tubal ligation    . Cesarean section      x 3    Family History  Problem Relation Age of Onset  . Hyperlipidemia Mother   . Hypertension Mother   . Heart failure Mother   . Prostate cancer Father   . Hyperlipidemia Sister   . Heart disease      family history  . Diabetes      family history  . Hypertension      family history    History  Substance Use Topics  . Smoking status: Current Every Day Smoker  . Smokeless tobacco: Never Used  . Alcohol Use: Yes    OB History   Grav Para Term Preterm Abortions TAB SAB Ect Mult Living                  Review of Systems  Constitutional: Positive for fever, chills, diaphoresis, activity change, appetite change and fatigue.  Eyes: Negative.   Respiratory: Negative.   Cardiovascular: Negative.   Gastrointestinal: Positive for vomiting, abdominal pain and diarrhea.  Endocrine: Negative.   Genitourinary:       Decreased urine output  Musculoskeletal: Negative.   Skin: Negative.   Neurological:  Negative.   Hematological: Negative.   Psychiatric/Behavioral: Negative.     Allergies  Codeine and Penicillins  Home Medications   Current Outpatient Rx  Name  Route  Sig  Dispense  Refill  . cetirizine (ZYRTEC ALLERGY) 10 MG tablet   Oral   Take 1 tablet (10 mg total) by mouth daily.   30 tablet   0   . clarithromycin (BIAXIN) 500 MG tablet   Oral   Take 1 tablet (500 mg total) by mouth 2 (two) times daily.   20 tablet   0   . diphenoxylate-atropine (LOMOTIL) 2.5-0.025 MG per tablet   Oral   Take 1 tablet by mouth 4 (four) times daily as needed for diarrhea or loose stools.   30 tablet   0   . ondansetron (ZOFRAN ODT) 4 MG disintegrating tablet   Oral   Take 1 tablet (4 mg total) by mouth every 8 (eight) hours as needed for nausea.   20 tablet   0   . sodium chloride (OCEAN NASAL SPRAY) 0.65 % nasal spray   Nasal   Place 1 spray into the nose as needed for congestion.   30 mL   12     BP 126/88  Pulse 99  Temp(Src) 98.9  F (37.2 C) (Oral)  SpO2 99%  Physical Exam  ED Course  Procedures (including critical care time)  Labs Reviewed - No data to display No results found.   1. Viral gastroenteritis       MDM  The patient has been prescribed when necessary Zofran and Lomotil. Once the vomiting is under control she is advised to drink plenty of clear liquids and get plenty of rest. She is further advised to advance her diet slowly back to a regular diet over the next 3-4 days.        Calvert Cantor, MD 05/06/12 1445

## 2013-05-17 ENCOUNTER — Emergency Department (HOSPITAL_COMMUNITY)
Admission: EM | Admit: 2013-05-17 | Discharge: 2013-05-17 | Disposition: A | Discharge status: Home / Self Care | Payer: Managed Care, Other (non HMO) | Attending: Emergency Medicine | Admitting: Emergency Medicine

## 2013-05-17 ENCOUNTER — Encounter (HOSPITAL_COMMUNITY): Payer: Self-pay | Admitting: Emergency Medicine

## 2013-05-17 ENCOUNTER — Emergency Department (HOSPITAL_COMMUNITY): Payer: Managed Care, Other (non HMO)

## 2013-05-17 DIAGNOSIS — Z79899 Other long term (current) drug therapy: Secondary | ICD-10-CM | POA: Insufficient documentation

## 2013-05-17 DIAGNOSIS — Z8742 Personal history of other diseases of the female genital tract: Secondary | ICD-10-CM | POA: Insufficient documentation

## 2013-05-17 DIAGNOSIS — R197 Diarrhea, unspecified: Secondary | ICD-10-CM | POA: Insufficient documentation

## 2013-05-17 DIAGNOSIS — F17200 Nicotine dependence, unspecified, uncomplicated: Secondary | ICD-10-CM | POA: Insufficient documentation

## 2013-05-17 DIAGNOSIS — J069 Acute upper respiratory infection, unspecified: Secondary | ICD-10-CM

## 2013-05-17 DIAGNOSIS — R112 Nausea with vomiting, unspecified: Secondary | ICD-10-CM

## 2013-05-17 DIAGNOSIS — Z862 Personal history of diseases of the blood and blood-forming organs and certain disorders involving the immune mechanism: Secondary | ICD-10-CM | POA: Insufficient documentation

## 2013-05-17 DIAGNOSIS — R509 Fever, unspecified: Secondary | ICD-10-CM

## 2013-05-17 DIAGNOSIS — Z880 Allergy status to penicillin: Secondary | ICD-10-CM | POA: Insufficient documentation

## 2013-05-17 LAB — CBC WITH DIFFERENTIAL/PLATELET
Basophils Absolute: 0 10*3/uL (ref 0.0–0.1)
Basophils Relative: 0 % (ref 0–1)
Eosinophils Absolute: 0 10*3/uL (ref 0.0–0.7)
Eosinophils Relative: 0 % (ref 0–5)
HCT: 40.2 % (ref 36.0–46.0)
Hemoglobin: 14 g/dL (ref 12.0–15.0)
Lymphocytes Relative: 20 % (ref 12–46)
Lymphs Abs: 1.4 10*3/uL (ref 0.7–4.0)
MCH: 32.6 pg (ref 26.0–34.0)
MCHC: 34.8 g/dL (ref 30.0–36.0)
MCV: 93.7 fL (ref 78.0–100.0)
Monocytes Absolute: 0.7 10*3/uL (ref 0.1–1.0)
Monocytes Relative: 11 % (ref 3–12)
Neutro Abs: 4.8 10*3/uL (ref 1.7–7.7)
Neutrophils Relative %: 69 % (ref 43–77)
Platelets: 201 10*3/uL (ref 150–400)
RBC: 4.29 MIL/uL (ref 3.87–5.11)
RDW: 13.4 % (ref 11.5–15.5)
WBC: 6.9 10*3/uL (ref 4.0–10.5)

## 2013-05-17 LAB — BASIC METABOLIC PANEL
BUN: 7 mg/dL (ref 6–23)
CO2: 21 mEq/L (ref 19–32)
Calcium: 8.8 mg/dL (ref 8.4–10.5)
Chloride: 100 mEq/L (ref 96–112)
Creatinine, Ser: 0.72 mg/dL (ref 0.50–1.10)
GFR calc Af Amer: >90 mL/min (ref >90)
GFR calc non Af Amer: >90 mL/min (ref >90)
Glucose, Bld: 94 mg/dL (ref 70–99)
Potassium: 3.8 mEq/L (ref 3.7–5.3)
Sodium: 138 mEq/L (ref 137–147)

## 2013-05-17 LAB — RAPID STREP SCREEN (MED CTR MEBANE ONLY): Streptococcus, Group A Screen (Direct): NEGATIVE

## 2013-05-17 MED ORDER — ONDANSETRON HCL 4 MG PO TABS: 4.0000 mg | ORAL_TABLET | Freq: Four times a day (QID) | ORAL | Status: DC

## 2013-05-17 MED ORDER — SODIUM CHLORIDE 0.9 % IV BOLUS (SEPSIS)
1000.0000 mL | INTRAVENOUS | Status: AC
  Administered 2013-05-17: 1000 mL via INTRAVENOUS

## 2013-05-17 MED ORDER — TRAMADOL HCL 50 MG PO TABS: 50.0000 mg | ORAL_TABLET | Freq: Four times a day (QID) | ORAL | Status: DC | PRN

## 2013-05-17 MED ORDER — ONDANSETRON HCL 4 MG/2ML IJ SOLN
4.0000 mg | Freq: Once | INTRAMUSCULAR | Status: AC
  Administered 2013-05-17: 4 mg via INTRAVENOUS
  Filled 2013-05-17: qty 2

## 2013-05-17 NOTE — ED Notes (Signed)
Generalized body aches, chills, n/v/d, non-productive cough, clear drainage since Thursday, reports fever at 102.4 this morning at 0200, patient took advil before arriving.  Also reports chest wall/rib pain on the right lower side.

## 2013-05-17 NOTE — ED Provider Notes (Signed)
CSN: [id number]     Arrival date & time 05/17/13  7972 History   First MD Initiated Contact with Patient 05/17/13 [phone]     Chief Complaint  Patient presents with  . Fever  . URI     (Consider location/radiation/quality/duration/timing/severity/associated sxs/prior Treatment) HPI Pt is a 48yo female c/o fever, associated with cough, sore throat, right lower chest pain, vomiting, and diarrhea that started on Thursday, 3/12.  Pt states Tmax 102.4 this morning, took Advil PTA.  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 Pt states she has taken imodium which helped with diarrhea, denies blood in stool. Reports 2 episodes of loose stool today. Reports 3 episodes of emesis since 5am this morning w/o blood or bile but states she had about 9 episodes of emesis yesterday and is concerned about becoming dehydrated.   Pt also c/o right lower chest pain that is sharp in nature "like i broke a rib," aching but only when she coughs. Denies trauma to area. Pt is an active 1/2ppd smoker.  Denies recent travel.  Denies significant PMH including asthma or abdominal issues.  Denies urinary or vaginal symptoms.   Past Medical History  Diagnosis Date  . Anemia     history of  . Fibroids     history of uterine fibroids   Past Surgical History  Procedure Laterality Date  . Cystoscopy    . Laparoscopic assisted vaginal hysterectomy    . Tubal ligation    . Cesarean section      x 3   Family History  Problem Relation Age of Onset  . Hyperlipidemia Mother   . Hypertension Mother   . Heart failure Mother   . Prostate cancer Father   . Hyperlipidemia Sister   . Heart disease      family history  . Diabetes      family history  . Hypertension      family history   History  Substance Use Topics  . Smoking status: Current Every Day Smoker  . Smokeless tobacco: Never Used  . Alcohol Use: Yes     Comment: occasionally    OB History   Grav Para Term Preterm Abortions TAB SAB Ect Mult Living                 Review of Systems  Constitutional: Positive for fever and appetite change. Negative for chills and unexpected weight change.  HENT: Positive for congestion and sore throat. Negative for trouble swallowing and voice change.   Respiratory: Positive for cough. Negative for shortness of breath.   Cardiovascular: Positive for chest pain ( right lower "only when i cough").  Gastrointestinal: Positive for nausea, vomiting and diarrhea. Negative for abdominal pain, constipation and blood in stool.  Genitourinary: Negative for dysuria, flank pain, vaginal discharge and pelvic pain.  Musculoskeletal: Negative for back pain.  All other systems reviewed and are negative.      Allergies  Penicillins and Codeine  Home Medications   Current Outpatient Rx  Name  Route  Sig  Dispense  Refill  . ibuprofen (ADVIL,MOTRIN) 200 MG tablet   Oral   Take 400 mg by mouth every 6 (six) hours as needed for fever.         . ondansetron (ZOFRAN) 4 MG tablet   Oral   Take 1 tablet (4 mg total) by mouth every 6 (six) hours.   12 tablet  0   . traMADol (ULTRAM) 50 MG tablet   Oral   Take 1 tablet (50 mg total) by mouth every 6 (six) hours as needed.   15 tablet   0    BP 121/64  Pulse 90  Temp(Src) 98.7 F (37.1 C) (Oral)  Resp 20  Ht 5\' 5"  (1.651 m)  Wt 195 lb (88.451 kg)  BMI 32.45 kg/m2  SpO2 100% Physical Exam  Nursing note and vitals reviewed. Constitutional: She appears well-developed and well-nourished.  Pt sitting in exam bed, surgical mask over face.  HENT:  Head: Normocephalic and atraumatic.  Right Ear: Hearing, tympanic membrane, external ear and ear canal normal.  Left Ear: Hearing, tympanic membrane, external ear and ear canal normal.  Nose: Nose normal.  Mouth/Throat: Uvula is midline and mucous membranes are normal. Oropharyngeal exudate, posterior oropharyngeal edema and posterior  oropharyngeal erythema present. No tonsillar abscesses.  Eyes: Conjunctivae are normal. No scleral icterus.  Neck: Normal range of motion. Neck supple. No JVD present. No tracheal deviation present. No thyromegaly present.  No nuchal rigidity or meningeal signs.  Cardiovascular: Normal rate, regular rhythm and normal heart sounds.   Pulmonary/Chest: Effort normal and breath sounds normal. No stridor. No respiratory distress. She has no wheezes. She has no rales. She exhibits no tenderness.  No respiratory distress, able to speak in full sentences w/o difficulty. Lungs: CTAB  Abdominal: Soft. Bowel sounds are normal. She exhibits no distension and no mass. There is no tenderness. There is no rebound and no guarding.  Soft, non-distended, non-tender. No CVAT  Musculoskeletal: Normal range of motion.  Lymphadenopathy:    She has no cervical adenopathy.  Neurological: She is alert.  Skin: Skin is warm.    ED Course  Procedures (including critical care time) Labs Review Labs Reviewed  RAPID STREP SCREEN  CULTURE, GROUP A STREP  CBC WITH DIFFERENTIAL  BASIC METABOLIC PANEL   Imaging Review Dg Chest 2 View  05/17/2013   CLINICAL DATA:  Shortness of breath, cough  EXAM: CHEST  2 VIEW  COMPARISON:  05/15/2011  FINDINGS: Cardiomediastinal silhouette is stable. No acute infiltrate or pleural effusion. No pulmonary edema. Bony thorax is unremarkable.  IMPRESSION: No active cardiopulmonary disease.   Electronically Signed   By: Lahoma Crocker M.D.   On: 05/17/2013 09:36     EKG Interpretation None      MDM   Final diagnoses:  Fever  URI, acute  Nausea vomiting and diarrhea    Pt is a 48yo female c/o fever, cough, n/v/d, sore throat since Thursday, 3/12.   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  Tmax 102.4 this morning, improved with Advil as Vitals in ED-afebrile: 98.7.  Mild tachycardia-113. O2-96% RA.  Pt appears well, NAD.  C/o right lower chest pain when she coughs  but low concern for PE or ACS.  Pain likely musculoskeletal, possible pneumonia.  Will get CXR, rapid strep, CBC, and BMP.  Started pt on IV NS and zofran.    CXR: no active cardiopulmonary disease CBC and BMP: unremarkable. Rapid strep: negative  Pt able to keep down several ounces of Ginger-ale.  Pt asking for something to eat.  Not concerned for emergent process taking place at this time. Will tx symptomatically.  Rx: tramadol for chest pain and zofran. Advised to keep taking acetaminophen and ibuprofen as needed for fever and pain.  Advised to f/u with PCP, resource guide provided. Return precautions provided. Pt verbalized understanding and agreement  with tx plan.    Noland Fordyce, PA-C 05/17/13 1303

## 2013-05-17 NOTE — ED Notes (Signed)
Patient to xray.

## 2013-05-17 NOTE — Discharge Instructions (Signed)
Emergency Department Resource Guide 1) Find a Doctor and Pay Out of Pocket Although you won't have to find out who is covered by your insurance plan, it is a good idea to ask around and get recommendations. You will then need to call the office and see if the doctor you have chosen will accept you as a new patient and what types of options they offer for patients who are self-pay. Some doctors offer discounts or will set up payment plans for their patients who do not have insurance, but you will need to ask so you aren't surprised when you get to your appointment.  2) Contact Your Local Health Department Not all health departments have doctors that can see patients for sick visits, but many do, so it is worth a call to see if yours does. If you don't know where your local health department is, you can check in your phone book. The CDC also has a tool to help you locate your state's health department, and many state websites also have listings of all of their local health departments.  3) Find a [facility] If your illness is not likely to be very severe or complicated, you may want to try a walk in clinic. These are popping up all over the country in pharmacies, drugstores, and shopping centers. They're usually staffed by nurse practitioners or physician assistants that have been trained to treat common illnesses and complaints. They're usually fairly quick and inexpensive. However, if you have serious medical issues or chronic medical problems, these are probably not your best option.  No Primary Care Doctor: - Call Health Connect at  [phone] - they can help you locate a primary care doctor that  accepts your insurance, provides certain services, etc. - Physician Referral Service- [phone]  Chronic Pain Problems: Organization         Address  Phone   Notes  [facility]  [phone] Patients need to be referred by their primary care doctor.   Medication  Assistance: Organization         Address  Phone   Notes  [facility] Medication [facility] [address] [place], [place] [address] [phone] --Must be a resident of [facility] -- Must have NO insurance coverage whatsoever (no Medicaid/ Medicare, etc.) -- The pt. MUST have a primary care doctor that directs their care regularly and follows them in the community   [facility]  [phone]   [employer]  [phone]    Agencies that provide inexpensive medical care: Organization         Address                                                       Phone                                                                            Notes  [place]  [phone]   [facility]    [phone])  [facility] [place], [place] [address] [phone]   [place]. [address], [place] [phone]   [facility]    [phone]   [facility]    [phone]   [place] and [place] [address], [place] Phone:  [phone], Fax:  [phone] Hours of Operation:  9 am - 6 pm, M-F.  Also accepts Medicaid/Medicare and self-pay.  [facility] for [address] [place], [address], [place] Phone: [phone], Fax: [phone]. Hours of Operation:  8:30 am - 5:30 pm, M-F.  Also accepts Medicaid and self-pay.  [facility] [address], [place] Phone: [phone]   [place], [place], [place] [phone], Ext. [phone] Mondays & Thursdays: 7-9 AM.  First 15 patients are seen on a first come, first serve basis.    [place] Providers:  Organization         Address                                                                       Phone                               Notes  [place] [facility] [address], [place] [phone] Also accepts self-pay patients.  [facility] [address], [place]  [phone]   [place], [address], [place] [phone]   [facility] [address] [place] [phone]   Lucianne Lei [address], [place]   [phone] Only accepts [place] Access [place] patients after they have their name applied to their card.   Self-Pay (no insurance) in [facility]:   Organization         Address                                                     Phone               Notes  Sickle Cell Patients, [facility] [place] [phone]   [facility] [place] [phone]   [facility] [place]  [place], [place], [place] [phone]   [facility]/Dr. Osei-Bonsu  [address] [place] or [address], [place] [phone] Phone number for both [place] and [place] locations is the same.  [facility] and [facility] [address] [place] [phone]   [facility] [address] [place] or [address] [phone]   [facility] [place], [place] [phone], phone; [phone], fax Sees patients 1st and 3rd Saturday of  every month.  Must not qualify for public or private insurance (i.e. Medicaid, Medicare, [place] Health Choice, Veterans' Benefits)  Household income should be no more than 200% of the poverty level The clinic cannot treat you if you are pregnant or think you are pregnant  Sexually transmitted diseases are not treated at the clinic.    Dental Care: Organization         Address                                  Phone                       Notes  [facility] Department of [facility] [phone] Accepts children up to age 9 who are enrolled in [place] or [place]; pregnant women with a Medicaid card; and children who have applied for Medicaid or [place] Health Choice, but were declined, whose parents can pay a reduced fee at time of service.  [facility] Department of [facility]  [address], [place] [phone] Accepts children up to age 24 who are enrolled in [place] or [place]; pregnant women with a Medicaid card; and children who have applied for Medicaid or [place] Health Choice, but were declined, whose parents can pay a reduced fee at time of service.  [place]  [place] [phone] Patients are seen by appointment only. Walk-ins are not accepted. [place] will see patients 45 years of age and older. Monday - Tuesday (8am-5pm) Most Wednesdays (8:30-5pm) $30 per visit, cash only  [facility] Adult Dental Access PROGRAM  [address], [facility] [phone] Patients are seen by appointment only. Walk-ins are not accepted. [place] will see patients 27 years of age and older. One Wednesday Evening (Monthly: Volunteer Based).  $30 per visit, cash only  [place]  [phone] for adults; Children under age 15, call Graduate Pediatric Dentistry at [phone]. Children aged 75-14, please call [phone] to request a pediatric application.  Dental services are provided in all areas of dental care including fillings, crowns and bridges, complete and partial dentures, implants, gum treatment, root canals, and extractions. Preventive care is also provided. Treatment is provided to both adults and children. Patients are selected via a lottery and there is often a waiting list.   [facility] [address], [place]  [phone] [email or web address]   [facility] [address] [place], [place] [phone], Ext. [phone] Second and Fourth Thursday of each month, opens at 6:30 AM; Clinic ends at 9 AM.  Patients are seen on a first-come first-served basis, and a limited number are seen during each clinic.   [facility]  [address] [place]er [place], [place] [phone]   Eligibility Requirements You must have lived in [place], [place], or [place] counties for at least the last three months.   You cannot be eligible for state or federal sponsored [employer], including [employer], [place], or [employer].   You generally cannot be eligible for healthcare insurance through your employer.    How to apply: Eligibility screenings are held every Tuesday and Wednesday afternoon from 1:00 pm until 4:00 pm. You do not need an appointment for the interview!  [facility] [address], [place], [place]   [place]  [place] Department  [place] Department  [phone]    Take 400-600 mg Ibuprofen (Motrin) every 6-8 hours for fever and pain  Alternate with Tylenol  Take [phone] mg Tylenol every 4-6 hours as needed for fever and pain  Follow-up with your primary care provider next week for recheck of symptoms if not improving.  Be sure to drink plenty of fluids and rest, at least 8hrs of sleep a night, preferably more while you are sick. Return to the ED if you cannot keep down fluids/signs of dehydration, fever not reducing with Tylenol, difficulty breathing/wheezing, stiff neck, worsening condition, or other concerns (see below)

## 2013-05-18 NOTE — ED Provider Notes (Signed)
Medical screening examination/treatment/procedure(s) were conducted as a shared visit with non-physician practitioner(s) and myself.  I personally evaluated the patient during the encounter.   EKG Interpretation None      Pt c/o cough, sore throat, episode of nv. Fevers. Chest cta. abd soft nt. Cxr.   Mirna Mires, MD 05/18/13 1130

## 2013-05-19 LAB — CULTURE, GROUP A STREP

## 2014-01-18 ENCOUNTER — Encounter (HOSPITAL_COMMUNITY): Payer: Self-pay | Admitting: Emergency Medicine

## 2014-01-18 ENCOUNTER — Emergency Department (HOSPITAL_COMMUNITY)
Admission: EM | Admit: 2014-01-18 | Discharge: 2014-01-18 | Disposition: A | Discharge status: Home / Self Care | Payer: 59 | Attending: Emergency Medicine | Admitting: Emergency Medicine

## 2014-01-18 DIAGNOSIS — J029 Acute pharyngitis, unspecified: Secondary | ICD-10-CM | POA: Insufficient documentation

## 2014-01-18 DIAGNOSIS — Z862 Personal history of diseases of the blood and blood-forming organs and certain disorders involving the immune mechanism: Secondary | ICD-10-CM | POA: Diagnosis not present

## 2014-01-18 DIAGNOSIS — Z720 Tobacco use: Secondary | ICD-10-CM | POA: Insufficient documentation

## 2014-01-18 DIAGNOSIS — Z8742 Personal history of other diseases of the female genital tract: Secondary | ICD-10-CM | POA: Diagnosis not present

## 2014-01-18 DIAGNOSIS — R112 Nausea with vomiting, unspecified: Secondary | ICD-10-CM | POA: Diagnosis not present

## 2014-01-18 LAB — RAPID STREP SCREEN (MED CTR MEBANE ONLY): Streptococcus, Group A Screen (Direct): NEGATIVE

## 2014-01-18 MED ORDER — AZITHROMYCIN 250 MG PO TABS: 500.0000 mg | ORAL_TABLET | Freq: Every day | ORAL | Status: DC

## 2014-01-18 NOTE — ED Provider Notes (Signed)
CSN: [id number]     Arrival date & time 01/18/14  1738 History   First MD Initiated Contact with Patient 01/18/14 1940     Chief Complaint  Patient presents with  . Sore Throat   (Consider location/radiation/quality/duration/timing/severity/associated sxs/prior Treatment) HPI Shelley Mason is a 48 yo female presenting with sore throat x 2 days.  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  She endorses some abd pain and an episode of vomiting 3 days ago and sore throat and tenderness around her throat since yesterday.  She has had some chills but denies any fever, cough, chest pain, shortness of breath or nasal congestion.    Past Medical History  Diagnosis Date  . Anemia     history of  . Fibroids     history of uterine fibroids   Past Surgical History  Procedure Laterality Date  . Cystoscopy    . Laparoscopic assisted vaginal hysterectomy    . Tubal ligation    . Cesarean section      x 3   Family History  Problem Relation Age of Onset  . Hyperlipidemia Mother   . Hypertension Mother   . Heart failure Mother   . Prostate cancer Father   . Hyperlipidemia Sister   . Heart disease      family history  . Diabetes      family history  . Hypertension      family history   History  Substance Use Topics  . Smoking status: Current Every Day Smoker  . Smokeless tobacco: Never Used  . Alcohol Use: Yes     Comment: occasionally   OB History    No data available     Review of Systems  Constitutional: Positive for chills. Negative for fever.  HENT: Positive for sore throat.   Respiratory: Negative for cough and shortness of breath.   Cardiovascular: Negative for chest pain and leg swelling.  Gastrointestinal: Positive for nausea and vomiting. Negative for diarrhea.  Musculoskeletal: Negative for myalgias.  Skin: Negative for rash.  Neurological: Negative for weakness, numbness and headaches.      Allergies  Penicillins and  Codeine  Home Medications   Prior to Admission medications   Medication Sig Start Date End Date Taking? Authorizing Provider  ibuprofen (ADVIL,MOTRIN) 200 MG tablet Take 400 mg by mouth every 6 (six) hours as needed for fever.   Yes Historical Provider, MD  ondansetron (ZOFRAN) 4 MG tablet Take 1 tablet (4 mg total) by mouth every 6 (six) hours. Patient not taking: Reported on 01/18/2014 05/17/13   Noland Fordyce, PA-C  traMADol (ULTRAM) 50 MG tablet Take 1 tablet (50 mg total) by mouth every 6 (six) hours as needed. Patient not taking: Reported on 01/18/2014 05/17/13   Noland Fordyce, PA-C   BP 152/82 mmHg  Pulse 92  Temp(Src) 98.3 F (36.8 C) (Oral)  Resp 18  SpO2 100% Physical Exam  Constitutional: She appears well-developed and well-nourished. No distress.  HENT:  Head: Normocephalic and atraumatic.  Mouth/Throat: No trismus in the jaw. Normal dentition. No uvula swelling. Oropharyngeal exudate and posterior oropharyngeal erythema present. No posterior oropharyngeal edema.  Eyes: Conjunctivae are normal.  Neck: Neck supple. No thyromegaly present.  Cardiovascular: Normal rate, regular rhythm and intact distal pulses.   Pulmonary/Chest: Effort normal and breath sounds normal. No respiratory distress.  Abdominal: Soft. There is no tenderness.  Musculoskeletal: She exhibits no tenderness.  Lymphadenopathy:       Head (  right side): Tonsillar adenopathy present.       Head (left side): No tonsillar adenopathy present.    She has cervical adenopathy.       Right cervical: Superficial cervical adenopathy present.  Neurological: She is alert.  Skin: Skin is warm and dry. No rash noted. She is not diaphoretic.  Nursing note and vitals reviewed.   ED Course  Procedures (including critical care time) Labs Review Labs Reviewed  RAPID STREP SCREEN  CULTURE, GROUP A STREP   Imaging Review No results found.   EKG Interpretation None      MDM   Final diagnoses:  Pharyngitis    48 yo female afebrile on exam but report of chills with tonsillar exudate, cervical lymphadenopathy, & dysphagia; Her rapid strep screen is negative, however clinically diagnosed with strep. Pt able to drink without difficulty.  Discharge instructions include prescription for azithromycin due to pt's pcn allergy.  Her presentation is not concerning for PTA or infxn spread to soft tissue. No trismus or uvula deviation. Specific return precautions discussed. Resources provided to establish care with PCP for follow-up.  She is aware of plan and in agreement.    Filed Vitals:   01/18/14 1749  BP: 152/82  Pulse: 92  Temp: 98.3 F (36.8 C)  TempSrc: Oral  Resp: 18  SpO2: 100%   Meds given in ED:  Medications - No data to display  Discharge Medication List as of 01/18/2014  8:32 PM    START taking these medications   Details  azithromycin (ZITHROMAX) 250 MG tablet Take 2 tablets (500 mg total) by mouth daily. Take first 2 tablets together daily for 5 days, Starting 01/18/2014, Until Discontinued, Print           Britt Bottom, NP 01/20/14 [place], MD 01/24/14 1039

## 2014-01-18 NOTE — ED Notes (Signed)
Pt. 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  Sore throat since Saturday and I have a knot on the left side of my jaw.

## 2014-01-18 NOTE — Discharge Instructions (Signed)
Please follow the directions provided.  Be sure to establish care with a primary care provider to ensure you are getting better.  Do not go to work until you have been on antibiotics for 24 hours.  Don't hesitate to return for any new, worsening or concerning symptoms.    SEEK IMMEDIATE MEDICAL CARE IF:  You develop any new symptoms such as vomiting, severe headache, stiff or painful neck, chest pain, shortness of breath, or trouble swallowing.  You develop severe throat pain, drooling, or changes in your voice.  You develop swelling of the neck, or the skin on the neck becomes red and tender.  You develop signs of dehydration, such as fatigue, dry mouth, and decreased urination.  You become increasingly sleepy, or you cannot wake up completely.    Emergency Department Resource Guide 1) Find a Doctor and Pay Out of Pocket Although you won't have to find out who is covered by your insurance plan, it is a good idea to ask around and get recommendations. You will then need to call the office and see if the doctor you have chosen will accept you as a new patient and what types of options they offer for patients who are self-pay. Some doctors offer discounts or will set up payment plans for their patients who do not have insurance, but you will need to ask so you aren't surprised when you get to your appointment.  2) Contact Your Local Health Department Not all health departments have doctors that can see patients for sick visits, but many do, so it is worth a call to see if yours does. If you don't know where your local health department is, you can check in your phone book. The CDC also has a tool to help you locate your state's health department, and many state websites also have listings of all of their local health departments.  3) Find a [facility] If your illness is not likely to be very severe or complicated, you may want to try a walk in clinic. These are popping up all over the country in  pharmacies, drugstores, and shopping centers. They're usually staffed by nurse practitioners or physician assistants that have been trained to treat common illnesses and complaints. They're usually fairly quick and inexpensive. However, if you have serious medical issues or chronic medical problems, these are probably not your best option.  No Primary Care Doctor: - Call Health Connect at  [phone] - they can help you locate a primary care doctor that  accepts your insurance, provides certain services, etc. - Physician Referral Service- [phone]  Chronic Pain Problems: Organization         Address  Phone   Notes  [facility]  [phone] Patients need to be referred by their primary care doctor.   Medication Assistance: Organization         Address  Phone   Notes  [facility] Medication [facility] [address] [place], [place] [address] [phone] --Must be a resident of [facility] -- Must have NO insurance coverage whatsoever (no Medicaid/ Medicare, etc.) -- The pt. MUST have a primary care doctor that directs their care regularly and follows them in the community   MedAssist  [phone]   [employer]  [phone]    Agencies that provide inexpensive medical care: Organization         Address  Phone   Notes  [place]  [phone])  [facility]    [phone]   [facility] [place], [place] [address] [phone]   [place] [address], [place] [phone]   Planned Parenthood    [phone]   [facility]    [phone]   [place] and [place] [address], [place] Phone:  [phone], Fax:  [phone] Hours of Operation:  9 am - 6 pm, M-F.  Also accepts Medicaid/Medicare and self-pay.  [facility] for [place], [address],  [place] Phone: [phone], Fax: [phone]. Hours of Operation:  8:30 am - 5:30 pm, M-F.  Also accepts Medicaid and self-pay.  [facility] [place] [address], [place] Phone: [phone]   [place], [place], [place] [phone], Ext. [phone] Mondays & Thursdays: 7-9 AM.  First 15 patients are seen on a first come, first serve basis.    [place] Providers:  Organization         Address  Phone   Notes  [facility] [address], [place] [phone] Also accepts self-pay patients.  [facility] [address], [place]  [phone]   [place], [address], [place] [phone]   [facility] [address], [place] [phone]   Lucianne Lei [address], [place]   [phone] Only accepts [place] Access [place] patients after they have their name applied to their card.   Self-Pay (no insurance) in [facility]:  Organization         Address  Phone   Notes  Sickle Cell Patients, [facility] [place] [phone]   [facility] [place] [phone]   [facility]  [place], [place], [place] [phone]   [facility]/Dr. Osei-Bonsu  [address], [place] or [address], [place] [phone] Phone number for both [place] and [place] locations is the same.  [facility] [address], [place] [phone]   [facility] [address] [place] or [address] [phone]   [facility] [address] [place] [phone], phone; [phone], fax Sees patients 1st and 3rd Saturday of every month.  Must not  qualify for public or private insurance (i.e. Medicaid, Medicare, [place] Health Choice, Veterans' Benefits)  Household income should be no more than 200% of the poverty level The clinic cannot treat you if you are pregnant or think you are pregnant  Sexually transmitted diseases are not treated at the clinic.    Dental Care: Organization         Address  Phone  Notes  [facility] Department of [facility] [place] [phone] Accepts children up to age 40 who are enrolled in [place] or [place]; pregnant women with a Medicaid card; and children who have applied for Medicaid or  Health Choice, but were declined, whose parents can pay a reduced fee at time of service.  [place]  [address], [facility] [phone] Accepts children up to age 43 who are enrolled in Medicaid or [place]; pregnant women with a Medicaid card; and children who have applied for Medicaid or [place] Health Choice, but were declined, whose parents can pay a reduced fee at time of service.  [place] Adult [facility] Access PROGRAM  [place] [phone] Patients are seen by appointment only. Walk-ins are not accepted. [place] will see patients 29 years of age and older. Monday - Tuesday (8am-5pm) Most Wednesdays (8:30-5pm) $30 per visit, cash only  [facility] Adult Dental Access PROGRAM  [address], [facility] [phone] Patients are seen by appointment only. Walk-ins are not accepted. [place] will see patients 45 years of age and older. One Wednesday Evening (Monthly: Volunteer Based).  $30 per visit, cash only  [place]  [phone] for adults; Children under age 67, call Graduate Pediatric Dentistry at [phone]. Children aged 74-14, please call [phone] to request a pediatric application.  Dental services are provided  in all areas of dental care including fillings, crowns and bridges, complete and partial dentures, implants, gum treatment, root canals, and extractions. Preventive care is also provided. Treatment is provided to both adults and children. Patients are selected via a lottery and there is often a waiting list.   [facility] [address] [place]  [phone] [email or web address]   [facility] [address] [place], [place] [phone], Ext. [phone] Second and Fourth Thursday of each month, opens at 6:30 AM; Clinic ends at 9 AM.  Patients are seen on a first-come first-served basis, and a limited number are seen during each clinic.   [facility]  [address] [place], [place] [phone]   Eligibility Requirements You must have lived in [place], [place], or [place] counties for at least the last three months.   You cannot be eligible for state or federal sponsored [employer], including [employer], [place], or [employer].   You generally cannot be eligible for healthcare insurance through your employer.    How to apply: Eligibility screenings are held every Tuesday and Wednesday afternoon from 1:00 pm until 4:00 pm. You do not need an appointment for the interview!  [facility] [address], [place], [place]   [place]  [place] Department  [place]  [phone]    Behavioral Health Resources in the Community: Intensive Outpatient Programs Organization         Address  Phone  Notes  [place]. [address], [place], [place] [phone]   [facility] [address] [place], [place]   ADS: Alcohol & Drug [facility] [address] [place], [place]   [place] [address],  [place], [place] or [phone]   Substance Abuse Resources Organization         Address  Phone  Notes  Alcohol and Drug Services  [phone]   [place]  [phone]   [facility] [place]   Chinita Pester  [phone]   Residential & Outpatient Substance Abuse Program  [phone]   Psychological Services Organization         Address  Phone  Notes  [place]  [place]  Services  [place] [address], [place] or [phone]    Mobile Crisis Teams Organization         Address  Phone  Notes  [facility], Mobile Crisis Care Unit  [phone]   [facility]  [address] [place], [place]   [place] [address], [place] [phone]    Self-Help/Support Groups Organization         Address  Phone             Notes  [place]. of [place] - variety of support groups  [place] Call for more information  Narcotics Anonymous (NA), [facility] [address], [employer] [place]  2 meetings at this location   Special educational needs teacher         Address  Phone  Notes  [facility] Residential Treatment [place],    [place]  [phone]   [facility]  [address], [place] [phone], [place], [place]   [place], [place] [phone] Admissions: 8am-3pm M-F  Incentives Substance [place] [address]    [place], [place] [phone]   [facility] [address] [place], [place], [place]   [facility] [address],  [place], [place]   [facility] Programs - Intensive Outpatient [address] Kristeen [address], [place], [place]   [facility] ([place].,  [place], [place] [phone] or  [phone]   [facility]idential Treatment Services (RTS) [address] [place], [place] Accepts Medicaid  [facility] [place] [address]  [place] [phone] Substance Abuse/Addiction Treatment   [facility] Organization         Address  Phone  Notes  [facility]  [phone]   Domenic Schwab, PhD [address] [place], [place]   [phone] or [phone]   [place], [place] [phone]   [facility] [address] [place], [place] [phone] Insurance/Medicaid/sponsorship through [facility] and Families [address], [address] [place]                                    [place], [place] [phone] [place] [address], [place] [phone]    Dr. Adele Schilder  [phone]   [facility] [place] [facility] Dept. 1) [address], [place] 2) [place] 3)  [address], [place] [phone]  [phone]   [place] [phone] or [phone] (After Hours)

## 2014-01-20 LAB — CULTURE, GROUP A STREP

## 2015-04-11 ENCOUNTER — Encounter (HOSPITAL_COMMUNITY): Payer: Self-pay | Admitting: Emergency Medicine

## 2015-04-11 ENCOUNTER — Emergency Department (HOSPITAL_COMMUNITY)
Admission: EM | Admit: 2015-04-11 | Discharge: 2015-04-11 | Disposition: A | Discharge status: Home / Self Care | Payer: 59 | Attending: Emergency Medicine | Admitting: Emergency Medicine

## 2015-04-11 DIAGNOSIS — Z86018 Personal history of other benign neoplasm: Secondary | ICD-10-CM | POA: Insufficient documentation

## 2015-04-11 DIAGNOSIS — J069 Acute upper respiratory infection, unspecified: Secondary | ICD-10-CM | POA: Diagnosis not present

## 2015-04-11 DIAGNOSIS — J029 Acute pharyngitis, unspecified: Secondary | ICD-10-CM | POA: Diagnosis present

## 2015-04-11 DIAGNOSIS — Z792 Long term (current) use of antibiotics: Secondary | ICD-10-CM | POA: Insufficient documentation

## 2015-04-11 DIAGNOSIS — R590 Localized enlarged lymph nodes: Secondary | ICD-10-CM

## 2015-04-11 DIAGNOSIS — Z880 Allergy status to penicillin: Secondary | ICD-10-CM | POA: Diagnosis not present

## 2015-04-11 DIAGNOSIS — Z862 Personal history of diseases of the blood and blood-forming organs and certain disorders involving the immune mechanism: Secondary | ICD-10-CM | POA: Diagnosis not present

## 2015-04-11 DIAGNOSIS — F17200 Nicotine dependence, unspecified, uncomplicated: Secondary | ICD-10-CM | POA: Insufficient documentation

## 2015-04-11 DIAGNOSIS — R05 Cough: Secondary | ICD-10-CM

## 2015-04-11 DIAGNOSIS — R059 Cough, unspecified: Secondary | ICD-10-CM

## 2015-04-11 LAB — RAPID STREP SCREEN (MED CTR MEBANE ONLY): Streptococcus, Group A Screen (Direct): NEGATIVE

## 2015-04-11 NOTE — Discharge Instructions (Signed)
Continue to stay well-hydrated. Gargle warm salt water and spit it out. Use hot tea or throat lozenges to help with sore throat. Continue to alternate between Tylenol and Ibuprofen for pain or fever. Use Mucinex for cough suppression/expectoration of mucus. Use netipot and flonase to help with nasal congestion. May consider over-the-counter Benadryl or other antihistamine to decrease secretions and for watery itchy eyes. Followup with [place] and wellness center in 5-7 days for recheck of ongoing symptoms and to establish care. You will need to follow up with them about your swollen lymph node. Return to emergency department for emergent changing or worsening of symptoms.   Cough, Adult A cough helps to clear your throat and lungs. A cough may last only 2-3 weeks (acute), or it may last longer than 8 weeks (chronic). Many different things can cause a cough. A cough may be a sign of an illness or another medical condition. HOME CARE  Pay attention to any changes in your cough.  Take medicines only as told by your doctor.  If you were prescribed an antibiotic medicine, take it as told by your doctor. Do not stop taking it even if you start to feel better.  Talk with your doctor before you try using a cough medicine.  Drink enough fluid to keep your pee (urine) clear or pale yellow.  If the air is dry, use a cold steam vaporizer or humidifier in your home.  Stay away from things that make you cough at work or at home.  If your cough is worse at night, try using extra pillows to raise your head up higher while you sleep.  Do not smoke, and try not to be around smoke. If you need help quitting, ask your doctor.  Do not have caffeine.  Do not drink alcohol.  Rest as needed. GET HELP IF:  You have new problems (symptoms).  You cough up yellow fluid (pus).  Your cough does not get better after 2-3 weeks, or your cough gets worse.  Medicine does not help your cough and you are not  sleeping well.  You have pain that gets worse or pain that is not helped with medicine.  You have a fever.  You are losing weight and you do not know why.  You have night sweats. GET HELP RIGHT AWAY IF:  You cough up blood.  You have trouble breathing.  Your heartbeat is very fast.   This information is not intended to replace advice given to you by your health care provider. Make sure you discuss any questions you have with your health care provider.   Document Released: 11/01/2010 Document Revised: 11/09/2014 Document Reviewed: 04/27/2014 Elsevier Interactive Patient Education 2016 Elsevier Inc.  Pharyngitis Pharyngitis is a sore throat (pharynx). There is redness, pain, and swelling of your throat. HOME CARE   Drink enough fluids to keep your pee (urine) clear or pale yellow.  Only take medicine as told by your doctor.  You may get sick again if you do not take medicine as told. Finish your medicines, even if you start to feel better.  Do not take aspirin.  Rest.  Rinse your mouth (gargle) with salt water ( tsp of salt per 1 qt of water) every 1-2 hours. This will help the pain.  If you are not at risk for choking, you can suck on hard candy or sore throat lozenges. GET HELP IF:  You have large, tender lumps on your neck.  You have a rash.  You cough up green, yellow-brown, or bloody spit. GET HELP RIGHT AWAY IF:   You have a stiff neck.  You drool or cannot swallow liquids.  You throw up (vomit) or are not able to keep medicine or liquids down.  You have very bad pain that does not go away with medicine.  You have problems breathing (not from a stuffy nose). MAKE SURE YOU:   Understand these instructions.  Will watch your condition.  Will get help right away if you are not doing well or get worse.   This information is not intended to replace advice given to you by your health care provider. Make sure you discuss any questions you have with your  health care provider.   Document Released: 08/07/2007 Document Revised: 12/09/2012 Document Reviewed: 10/26/2012 Elsevier Interactive Patient Education 2016 Elsevier Inc.  Viral Infections A viral infection can be caused by different types of viruses.Most viral infections are not serious and resolve on their own. However, some infections may cause severe symptoms and may lead to further complications. SYMPTOMS Viruses can frequently cause:  Minor sore throat.  Aches and pains.  Headaches.  Runny nose.  Different types of rashes.  Watery eyes.  Tiredness.  Cough.  Loss of appetite.  Gastrointestinal infections, resulting in nausea, vomiting, and diarrhea. These symptoms do not respond to antibiotics because the infection is not caused by bacteria. However, you might catch a bacterial infection following the viral infection. This is sometimes called a "superinfection." Symptoms of such a bacterial infection may include:  Worsening sore throat with pus and difficulty swallowing.  Swollen neck glands.  Chills and a high or persistent fever.  Severe headache.  Tenderness over the sinuses.  Persistent overall ill feeling (malaise), muscle aches, and tiredness (fatigue).  Persistent cough.  Yellow, green, or brown mucus production with coughing. HOME CARE INSTRUCTIONS   Only take over-the-counter or prescription medicines for pain, discomfort, diarrhea, or fever as directed by your caregiver.  Drink enough water and fluids to keep your urine clear or pale yellow. Sports drinks can provide valuable electrolytes, sugars, and hydration.  Get plenty of rest and maintain proper nutrition. Soups and broths with crackers or rice are fine. SEEK IMMEDIATE MEDICAL CARE IF:   You have severe headaches, shortness of breath, chest pain, neck pain, or an unusual rash.  You have uncontrolled vomiting, diarrhea, or you are unable to keep down fluids.  You or your child has an  oral temperature above 102 F (38.9 C), not controlled by medicine.  Your baby is older than 3 months with a rectal temperature of 102 F (38.9 C) or higher.  Your baby is 79 months old or younger with a rectal temperature of 100.4 F (38 C) or higher. MAKE SURE YOU:   Understand these instructions.  Will watch your condition.  Will get help right away if you are not doing well or get worse.   This information is not intended to replace advice given to you by your health care provider. Make sure you discuss any questions you have with your health care provider.   Document Released: 11/28/2004 Document Revised: 05/13/2011 Document Reviewed: 07/27/2014 Elsevier Interactive Patient Education [employer].

## 2015-04-11 NOTE — ED Notes (Signed)
Started 3 days ago with sore throat. Swollen, tender left cervical lymph node.

## 2015-04-11 NOTE — ED Provider Notes (Signed)
History  By signing my name below, I, Shelley Mason, attest that this documentation has been prepared under the direction and in the presence of [address] [employer]. Electronically Signed: Marlowe Mason, ED Scribe. 04/11/2015. 10:13 AM.  Chief Complaint  Patient presents with  . Sore Throat   Patient is a 50 y.o. female presenting with pharyngitis. The history is provided by the patient and medical records. No language interpreter was used.  Sore Throat This is a new problem. The current episode started in the past 7 days. The problem occurs intermittently. The problem has been unchanged. Associated symptoms include coughing, a fever and a sore throat. Pertinent negatives include no abdominal pain, arthralgias, chest pain, myalgias, nausea, numbness, rash, urinary symptoms, vomiting or weakness. Exacerbated by: having her mouth get dry. Treatments tried: hot tea and lemon. The treatment provided moderate relief.    HPI Comments:  Shelley Mason is a 50 y.o. female who presents to the Emergency Department complaining of sore throat that began about three days ago. She describes the pain as 8/10 intermittent, burning posterior throat pain which is nonradiating, worse when her mouth gets dry, and improved with tylenol and hot tea/lemon. She reports associated fever Tmax 100.2 degrees, productive cough of green mucus.  She also reports a swollen lymph node in her neck as well which has been present for 1 month but gradually getting bigger. 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 She denies otalgia, ear drainage, eye itching or irritations, drooling, trismus, CP, SOB, wheezing, abdominal pain, diarrhea, nausea, vomiting, constipation, rhinorrhea, urinary symptoms, numbness, tingling, or weakness.   Past Medical History  Diagnosis Date  . Anemia     history of  . Fibroids     history of uterine fibroids   Past Surgical History  Procedure Laterality  Date  . Cystoscopy    . Laparoscopic assisted vaginal hysterectomy    . Tubal ligation    . Cesarean section      x 3   Family History  Problem Relation Age of Onset  . Hyperlipidemia Mother   . Hypertension Mother   . Heart failure Mother   . Prostate cancer Father   . Hyperlipidemia Sister   . Heart disease      family history  . Diabetes      family history  . Hypertension      family history   Social History  Substance Use Topics  . Smoking status: Current Every Day Smoker  . Smokeless tobacco: Never Used  . Alcohol Use: Yes     Comment: occasionally   OB History    No data available     Review of Systems  Constitutional: Positive for fever.  HENT: Positive for sore throat and voice change. Negative for drooling, ear discharge, ear pain, rhinorrhea and trouble swallowing.   Eyes: Negative for redness and itching.  Respiratory: Positive for cough. Negative for shortness of breath and wheezing.   Cardiovascular: Negative for chest pain.  Gastrointestinal: Negative for nausea, vomiting, abdominal pain, diarrhea and constipation.  Genitourinary: Negative for dysuria and hematuria.  Musculoskeletal: Negative for myalgias and arthralgias.  Skin: Negative for rash.  Allergic/Immunologic: Negative for immunocompromised state.  Neurological: Negative for weakness and numbness.  Hematological: Positive for adenopathy.    A complete 10 system review of systems was obtained and all systems are negative except as noted in the HPI and PMH.   Allergies  Penicillins and Codeine  Home Medications   Prior to Admission medications   Medication Sig Start Date End Date Taking? Authorizing Provider  azithromycin (ZITHROMAX) 250 MG tablet Take 2 tablets (500 mg total) by mouth daily. Take first 2 tablets together daily for 5 days 01/18/14   Britt Bottom, NP  ibuprofen (ADVIL,MOTRIN) 200 MG tablet Take 400 mg by mouth every 6 (six) hours as needed for fever.    Historical  Provider, MD  ondansetron (ZOFRAN) 4 MG tablet Take 1 tablet (4 mg total) by mouth every 6 (six) hours. Patient not taking: Reported on 01/18/2014 05/17/13   Noland Fordyce, PA-C  traMADol (ULTRAM) 50 MG tablet Take 1 tablet (50 mg total) by mouth every 6 (six) hours as needed. Patient not taking: Reported on 01/18/2014 05/17/13   Noland Fordyce, PA-C   Triage Vitals: BP 133/92 mmHg  Pulse 96  Temp(Src) 98.6 F (37 C) (Oral)  Resp 18  Ht 5' 5.5" (1.664 m)  Wt 182 lb (82.555 kg)  BMI 29.82 kg/m2  SpO2 97% Physical Exam  Constitutional: She is oriented to person, place, and time. Vital signs are normal. She appears well-developed and well-nourished.  Non-toxic appearance. No distress.  Afebrile, nontoxic, NAD  HENT:  Head: Normocephalic and atraumatic.  Right Ear: Hearing, tympanic membrane, external ear and ear canal normal.  Left Ear: Hearing, tympanic membrane, external ear and ear canal normal.  Nose: Nose normal.  Mouth/Throat: Uvula is midline and mucous membranes are normal. No trismus in the jaw. No uvula swelling. Posterior oropharyngeal edema and posterior oropharyngeal erythema present. No oropharyngeal exudate or tonsillar abscesses.  Ears are clear bilaterally. Nose clear. Oropharynx clear and moist, without uvular swelling or deviation, no trismus or drooling, 1+ bilateral tonsillar swelling and erythema, no exudates. No PTA.  Eyes: Conjunctivae and EOM are normal. Right eye exhibits no discharge. Left eye exhibits no discharge.  Neck: Normal range of motion. Neck supple.  Cardiovascular: Normal rate, regular rhythm, normal heart sounds and intact distal pulses.  Exam reveals no gallop and no friction rub.   No murmur heard. Pulmonary/Chest: Effort normal and breath sounds normal. No respiratory distress. She has no decreased breath sounds. She has no wheezes. She has no rhonchi. She has no rales.  CTAB in all lung fields, no w/r/r, no hypoxia or increased WOB, speaking in full  sentences, SpO2 97% on RA  Abdominal: Soft. Normal appearance and bowel sounds are normal. She exhibits no distension. There is no tenderness. There is no rigidity, no rebound, no guarding, no CVA tenderness, no tenderness at McBurney's point and negative Murphy's sign.  Musculoskeletal: Normal range of motion.  Lymphadenopathy:    She has cervical adenopathy.       Left cervical: Superficial cervical adenopathy present.  One mildly tenderness left-sided superficial cervical lymph node which is mobile, measuring approximately 1 cm in diameter. Shotty lymphadenopathy.  Neurological: She is alert and oriented to person, place, and time. She has normal strength. No sensory deficit.  Skin: Skin is warm, dry and intact. No rash noted.  Psychiatric: She has a normal mood and affect.  Nursing note and vitals reviewed.   ED Course  Procedures (including critical care time) DIAGNOSTIC STUDIES: Oxygen Saturation is 97% on RA, normal by my interpretation.   COORDINATION OF CARE: 9:31 AM- Will order rapid strep test. Pt verbalizes understanding and agrees to plan.  Medications - No data to display  Labs Review Labs Reviewed  RAPID STREP SCREEN (NOT AT [facility])  CULTURE, GROUP A STREP (  [facility])    MDM   Final diagnoses:  Sore throat  Pharyngitis  URI (upper respiratory infection)  Lymphadenopathy of left cervical region  Cough    50 y.o. female here with sore throat and cough. Pt is afebrile with a clear lung exam, doubt need for xray. Mild tonsillar injection and edema, no PTA, no evidence of ludwigs, no exudates. Low-moderate CENTOR score, will send off RST. Pt also has one solitary node on L side of neck, mobile and tender, but rather large. States it's been there for 56month. Discussed that she'll likely need to be followed up with this as an outpt. Pt declines wanting anything right now for pain. Will reassess after RST.  10:15 AM RST neg. Given low-mod CENTOR score, doubt need for empiric  treatment.  Likely viral URI. Pt is agreeable to symptomatic treatment with close follow up with PCP as needed but spoke at length about emergent changing or worsening of symptoms that should prompt return to ER. Pt voices understanding and is agreeable to plan. Stable at time of discharge.   I personally performed the services described in this documentation, which was scribed in my presence. The recorded information has been reviewed and is accurate.  BP 133/92 mmHg  Pulse 96  Temp(Src) 98.6 F (37 C) (Oral)  Resp 18  Ht 5' 5.5" (1.664 m)  Wt 82.555 kg  BMI 29.82 kg/m2  SpO2 97%  No orders of the defined types were placed in this encounter.       Dahmir Epperly Camprubi-Soms, PA-C 04/11/15 [place], MD 04/11/15 [id number]

## 2015-04-13 LAB — CULTURE, GROUP A STREP (THRC)

## 2015-06-21 ENCOUNTER — Ambulatory Visit (INDEPENDENT_AMBULATORY_CARE_PROVIDER_SITE_OTHER): Payer: 59 | Admitting: Family Medicine

## 2015-06-21 ENCOUNTER — Encounter: Payer: Self-pay | Admitting: Family Medicine

## 2015-06-21 ENCOUNTER — Ambulatory Visit: Payer: 59 | Admitting: Family Medicine

## 2015-06-21 VITALS — BP 124/84 | HR 91 | Temp 98.3°F | Wt 199.1 lb

## 2015-06-21 DIAGNOSIS — R221 Localized swelling, mass and lump, neck: Secondary | ICD-10-CM | POA: Diagnosis not present

## 2015-06-21 DIAGNOSIS — Z720 Tobacco use: Secondary | ICD-10-CM | POA: Diagnosis not present

## 2015-06-21 DIAGNOSIS — R591 Generalized enlarged lymph nodes: Secondary | ICD-10-CM | POA: Diagnosis not present

## 2015-06-21 DIAGNOSIS — Z23 Encounter for immunization: Secondary | ICD-10-CM | POA: Diagnosis not present

## 2015-06-21 DIAGNOSIS — R599 Enlarged lymph nodes, unspecified: Secondary | ICD-10-CM

## 2015-06-21 DIAGNOSIS — F17210 Nicotine dependence, cigarettes, uncomplicated: Secondary | ICD-10-CM

## 2015-06-21 MED ORDER — NICOTINE 21 MG/24HR TD PT24: 21.0000 mg | MEDICATED_PATCH | Freq: Every day | TRANSDERMAL | Status: DC

## 2015-06-21 NOTE — Patient Instructions (Signed)
Thank you for coming in today, it was so nice to see you!  Today we talked about the knot on your neck and smoking.  For the knot, I am going to refer you to an ear, nose and throat doctor. You will receive a phone call to schedule the appointment.   For your smoking, I am going to send over a prescription for nicotine patches.   I'd like to see you back in a couple months or sooner if you do not like the Nicotine patches and we can talk about other options. Please make an appointment at the front desk before you leave.   If you have any questions or concerns, please do not hesitate to call the office at [phone].  Sincerely,  Smitty Cords, MD   Nicotine skin patches What is this medicine? NICOTINE ([place]) helps people stop smoking. The patches replace the nicotine found in cigarettes and help to decrease withdrawal effects. They are most effective when used in combination with a stop-smoking program. This medicine may be used for other purposes; ask your health care provider or pharmacist if you have questions. What should I tell my health care provider before I take this medicine? They need to know if you have any of these conditions: -diabetes -heart disease, angina, irregular heartbeat or previous heart attack -high blood pressure -lung disease, including asthma -overactive thyroid -pheochromocytoma -seizures or a history of seizures -skin problems, like eczema -stomach problems or ulcers -an unusual or allergic reaction to nicotine, adhesives, other medicines, foods, dyes, or preservatives -pregnant or trying to get pregnant -breast-feeding How should I use this medicine? This medicine is for use on the skin. Follow the directions that come with the patches. Find an area of skin on your upper arm, chest, or back that is clean, dry, greaseless, undamaged and hairless. Wash hands with plain soap and water. Do not use anything that contains aloe, lanolin or  glycerin as these may prevent the patch from sticking. Dry thoroughly. Remove the patch from the sealed pouch. Do not try to cut or trim the patch. Using your palm, press the patch firmly in place for 10 seconds to make sure that there is good contact with your skin. After applying the patch, wash your hands. Change the patch every day, keeping to a regular schedule. When you apply a new patch, use a new area of skin. Wait at least 1 week before using the same area again. Talk to your pediatrician regarding the use of this medicine in children. Special care may be needed. Overdosage: If you think you have taken too much of this medicine contact a poison control center or emergency room at once. NOTE: This medicine is only for you. Do not share this medicine with others. What if I miss a dose? If you forget to replace a patch, use it as soon as you can. Only use one patch at a time and do not leave on the skin for longer than directed. If a patch falls off, you can replace it, but keep to your schedule and remove the patch at the right time. What may interact with this medicine? -medicines for asthma -medicines for blood pressure -medicines for mental depression This list may not describe all possible interactions. Give your health care provider a list of all the medicines, herbs, non-prescription drugs, or dietary supplements you use. Also tell them if you smoke, drink alcohol, or use illegal drugs. Some items may interact with your medicine.  What should I watch for while using this medicine? You should begin using the nicotine patch the day you stop smoking. It is okay if you do not succeed at your attempt to quit and have a cigarette. You can still continue your quit attempt and keep using the product as directed. Just throw away your cigarettes and get back to your quit plan. You can keep the patch in place during swimming, bathing, and showering. If your patch falls off during these activities,  replace it. When you first apply the patch, your skin may itch or burn. This should go away soon. When you remove a patch, the skin may look red, but this should only last for a few days. Call your doctor or health care professional if skin redness does not go away after 4 days, if your skin swells, or if you get a rash. If you are a diabetic and you quit smoking, the effects of insulin may be increased and you may need to reduce your insulin dose. Check with your doctor or health care professional about how you should adjust your insulin dose. If you are going to have a magnetic resonance imaging (MRI) procedure, tell your MRI technician if you have this patch on your body. It must be removed before a MRI. What side effects may I notice from receiving this medicine? Side effects that you should report to your doctor or health care professional as soon as possible: -allergic reactions like skin rash, itching or hives, swelling of the face, lips, or tongue -breathing problems -changes in hearing -changes in vision -chest pain -cold sweats -confusion -fast, irregular heartbeat -feeling faint or lightheaded, falls -headache -increased saliva -skin redness that lasts more than 4 days -stomach pain -signs and symptoms of nicotine overdose like nausea; vomiting; dizziness; weakness; and rapid heartbeat Side effects that usually do not require medical attention (report to your doctor or health care professional if they continue or are bothersome): -diarrhea -dry mouth -hiccups -irritability -nervousness or restlessness -trouble sleeping or vivid dreams This list may not describe all possible side effects. Call your doctor for medical advice about side effects. You may report side effects to FDA at 1-800-FDA-1088. Where should I keep my medicine? Keep out of the reach of children. Store at room temperature between 20 and 25 degrees C (68 and 77 degrees F). Protect from heat and light. Store in  International aid/development worker until ready to use. Throw away unused medicine after the expiration date. When you remove a patch, fold with sticky sides together; put in an empty opened pouch and throw away. NOTE: This sheet is a summary. It may not cover all possible information. If you have questions about this medicine, talk to your doctor, pharmacist, or health care provider.    2016, Elsevier/[employer] Standard. (2014-01-17 15:46:21)

## 2015-06-21 NOTE — Progress Notes (Signed)
Subjective:    Patient ID: Shelley Mason , female   DOB: March 31, 1965 , 50 y.o..   MRN: [id number]  HPI  Shelley Mason is here to establish care.  Acute concerns include:  1) "Knot" on left neck:  First noticed a mass on her left neck 6 months ago. She went to put earrings on and noticed a knot on her left neck.  Size has not increased over last 6 months and it is not painful. Went to ED in February and was told she needed to follow up with PCP. In February had a sore throat and URI diagnosed in ED.  Had a knot on her neck 5 years ago in the same place. Apparently went to a doctor who drained it and said it was non-cancerous. Denies neck pain, trouble swallowing, fevers, or night sweats.   2) Smoking:  1 PPD for last 15 years.  Has never tried anything in the past to help her quit. Is interested in starting a nicotine patch and setting a quit date.  Review of Systems: Per HPI. All other systems reviewed and are negative. Review of Systems  Constitutional: Negative for fever, chills, weight loss and malaise/fatigue.  HENT: Negative for sore throat.   Eyes: Negative for blurred vision.       Wears glasses  Respiratory: Negative for cough and shortness of breath.   Cardiovascular: Negative for chest pain, palpitations and leg swelling.  Gastrointestinal: Positive for heartburn. Negative for nausea, vomiting, abdominal pain, diarrhea, constipation and blood in stool.  Genitourinary: Negative for dysuria and hematuria.  Musculoskeletal: Positive for joint pain (right hip).  Skin: Negative for rash.  Neurological: Negative for dizziness, tremors and headaches.  Psychiatric/Behavioral: Positive for depression (she feels she is depressed because she wants a care).   Past Medical History  Diagnosis Date  . Anemia     history of  . Fibroids     history of uterine fibroids    Medications: reviewed and updated Current Outpatient Prescriptions  Medication Sig Dispense Refill   . ibuprofen (ADVIL,MOTRIN) 200 MG tablet Take 400 mg by mouth every 6 (six) hours as needed for fever. Reported on 06/21/2015    . nicotine (NICODERM CQ - DOSED IN MG/24 HOURS) 21 mg/24hr patch Place 1 patch (21 mg total) onto the skin daily. 28 patch 3   No current facility-administered medications for this visit.    Social Hx:  reports that she has been smoking Cigarettes.  She started smoking about 15 years ago. She has been smoking about 1.00 pack per day. She has never used smokeless tobacco.    Objective:   BP 124/84 mmHg  Pulse 91  Temp(Src) 98.3 F (36.8 C) (Oral)  Wt 199 lb 1.6 oz (90.311 kg)  Physical Exam  Constitutional: She is oriented to person, place, and time. She appears well-developed and well-nourished. No distress.  HENT:  Head: Normocephalic and atraumatic.  Right Ear: External ear normal.  Left Ear: External ear normal.  Nose: Nose normal.  Mouth/Throat: Oropharynx is clear and moist. No oropharyngeal exudate.  Eyes: Conjunctivae and EOM are normal. Pupils are equal, round, and reactive to light. No scleral icterus.  Neck: Normal range of motion. No thyromegaly present.    ~2cm palpable firm nodule on left anterior cervical triangle, non tender, non erythematous. No other lymphadenopathy or palpable masses   Cardiovascular: Normal rate, regular rhythm, normal heart sounds and intact distal pulses.   Pulmonary/Chest: Effort normal and breath sounds normal.  No stridor. No respiratory distress. She has no wheezes.  Abdominal: Soft. Bowel sounds are normal. She exhibits no distension.  Musculoskeletal: Normal range of motion. She exhibits no edema or tenderness.  Neurological: She is alert and oriented to person, place, and time. She has normal reflexes.  Skin: Skin is warm and dry. No rash noted.  Psychiatric: She has a normal mood and affect. Her behavior is normal. Judgment and thought content normal.    Assessment & Plan:  Cigarette smoker Hx of 1 PPD  for the last 15 years. Is interested in quitting. No respiratory symptoms noted. - Discussed medication options for smoking cessation, patient agreeable to Nicotine patches - Prescription for Nicotine patches 21 mg daily sent to pharmacy - Will follow up in 8 weeks to see if patient is cigarette free and decrease Nicotine patch to 14 mg daily if necessary  Mass present on one side of neck Precepted patient to Dr. Nori Riis. Unclear etiology. ~2 cm mass present on left neck for ~ 6 months and has not increased in size. This has happened to patient before years ago and apparently it was drained and non-malignant. No other palpable masses. Differentials include lymph node enlargement from viral vs bacterial cause (unlikely that pharyngitis would be cause of enlargement as it happened 2 months ago, also not febrile or ill today), congential mass such as brachial cyst (unlikely due to , or possible malignancy (Strong smoking history. Reassuringly patient has not been losing weight, having night sweats, or febrile). - ENT referral for further evaluation - Future order placed for CBC with differential - Will continue to follow.

## 2015-06-21 NOTE — Assessment & Plan Note (Addendum)
Precepted patient to Dr. Nori Riis. Unclear etiology. ~2 cm mass present on left neck for ~ 6 months and has not increased in size. This has happened to patient before years ago and apparently it was drained and non-malignant. No other palpable masses. Differentials include lymph node enlargement from viral vs bacterial cause (unlikely that pharyngitis would be cause of enlargement as it happened 2 months ago, also not febrile or ill today), congential mass such as brachial cyst (unlikely due to , or possible malignancy (Strong smoking history. Reassuringly patient has not been losing weight, having night sweats, or febrile). - ENT referral for further evaluation - Future order placed for CBC with differential - Will continue to follow.

## 2015-06-21 NOTE — Assessment & Plan Note (Signed)
Hx of 1 PPD for the last 15 years. Is interested in quitting. No respiratory symptoms noted. - Discussed medication options for smoking cessation, patient agreeable to Nicotine patches - Prescription for Nicotine patches 21 mg daily sent to pharmacy - Will follow up in 8 weeks to see if patient is cigarette free and decrease Nicotine patch to 14 mg daily if necessary

## 2015-08-30 ENCOUNTER — Telehealth: Payer: Self-pay | Admitting: Family Medicine

## 2015-08-30 NOTE — Telephone Encounter (Signed)
Called patient to discuss how cigarette cessation is going. No answer. Left voicemail. Will try again at another time.

## 2017-04-18 ENCOUNTER — Encounter: Payer: Self-pay | Admitting: Family Medicine

## 2017-04-18 ENCOUNTER — Ambulatory Visit (INDEPENDENT_AMBULATORY_CARE_PROVIDER_SITE_OTHER): Payer: Managed Care, Other (non HMO) | Admitting: Family Medicine

## 2017-04-18 ENCOUNTER — Other Ambulatory Visit: Payer: Self-pay

## 2017-04-18 VITALS — BP 128/78 | HR 94 | Temp 98.7°F | Ht 65.0 in | Wt 203.6 lb

## 2017-04-18 DIAGNOSIS — Z862 Personal history of diseases of the blood and blood-forming organs and certain disorders involving the immune mechanism: Secondary | ICD-10-CM | POA: Diagnosis not present

## 2017-04-18 DIAGNOSIS — Z0001 Encounter for general adult medical examination with abnormal findings: Secondary | ICD-10-CM | POA: Diagnosis not present

## 2017-04-18 DIAGNOSIS — F17210 Nicotine dependence, cigarettes, uncomplicated: Secondary | ICD-10-CM | POA: Diagnosis not present

## 2017-04-18 DIAGNOSIS — Z1231 Encounter for screening mammogram for malignant neoplasm of breast: Secondary | ICD-10-CM | POA: Diagnosis not present

## 2017-04-18 DIAGNOSIS — R300 Dysuria: Secondary | ICD-10-CM | POA: Diagnosis not present

## 2017-04-18 DIAGNOSIS — F32A Depression, unspecified: Secondary | ICD-10-CM

## 2017-04-18 DIAGNOSIS — R221 Localized swelling, mass and lump, neck: Secondary | ICD-10-CM | POA: Diagnosis not present

## 2017-04-18 DIAGNOSIS — Z114 Encounter for screening for human immunodeficiency virus [HIV]: Secondary | ICD-10-CM | POA: Diagnosis not present

## 2017-04-18 DIAGNOSIS — Z1239 Encounter for other screening for malignant neoplasm of breast: Secondary | ICD-10-CM

## 2017-04-18 DIAGNOSIS — Z0000 Encounter for general adult medical examination without abnormal findings: Secondary | ICD-10-CM

## 2017-04-18 DIAGNOSIS — F329 Major depressive disorder, single episode, unspecified: Secondary | ICD-10-CM

## 2017-04-18 LAB — POCT URINALYSIS DIP (MANUAL ENTRY)
BILIRUBIN UA: NEGATIVE mg/dL
Bilirubin, UA: NEGATIVE
Glucose, UA: NEGATIVE mg/dL
LEUKOCYTES UA: NEGATIVE
NITRITE UA: POSITIVE — AB
Protein Ur, POC: NEGATIVE mg/dL
Spec Grav, UA: 1.025 (ref 1.010–1.025)
Urobilinogen, UA: 0.2 E.U./dL
pH, UA: 6 (ref 5.0–8.0)

## 2017-04-18 LAB — POCT UA - MICROSCOPIC ONLY

## 2017-04-18 MED ORDER — NICOTINE 21 MG/24HR TD PT24: 21.0000 mg | MEDICATED_PATCH | Freq: Every day | TRANSDERMAL | 3 refills | Status: DC

## 2017-04-18 MED ORDER — SULFAMETHOXAZOLE-TRIMETHOPRIM 800-160 MG PO TABS: 1.0000 | ORAL_TABLET | Freq: Two times a day (BID) | ORAL | 0 refills | Status: AC

## 2017-04-18 MED ORDER — CITALOPRAM HYDROBROMIDE 20 MG PO TABS: 20.0000 mg | ORAL_TABLET | Freq: Every day | ORAL | 0 refills | Status: DC

## 2017-04-18 NOTE — Patient Instructions (Signed)
Thank you for coming in today, it was so nice to see you! Today we talked about:    Depression: We have started you on a medication called Celexa.  Please take 1 pill every day.  Also please call the number on the yellow paper to schedule appointment with a behavioral health consultant to talk about your depression and anxiety.  We are ordering a urine test to make sure you do have a urine infection due to increased painful urination  I have placed a referral for you to get a mammogram, someone to talk to you about this in the next week to schedule  We order lots of blood test today  I will look forward to seeing the ear nose and throat doctors note about your neck  Good job quitting smoking, I have sent nicotine patches to your pharmacy  Please follow up in 2 weeks for depression. You can schedule this appointment at the front desk before you leave or call the clinic.  Bring in all your medications or supplements to each appointment for review.   If we ordered any tests today, you will be notified via telephone of any abnormalities. If everything is normal you will get a letter in the mail.   If you have any questions or concerns, please do not hesitate to call the office at [phone]. You can also message me directly via MyChart.   Sincerely,  Smitty Cords, MD

## 2017-04-18 NOTE — Progress Notes (Signed)
Subjective:  Shelley Mason is a 52 y.o. year old female who presents to office today for an annual physical examination.  Concerns today include:  1.  Depression: Patient endorses depression since her mother passed away in 10/25/2016.  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  She notes that she cries almost a daily.  She has trouble sleeping due to the her mind racing and thinking about her mother.  She denies having any psychiatric diagnosis is in the past.  She denies staying up for days at a time or spending excessive money.   2.  Dysuria: Patient endorses dysuria for the last 2 days.  She denies any flank pain or pelvic pain.  Denies any recent sexual activity.  Denies any fevers  Review of Systems  Constitutional: Negative for fever and weight loss.  HENT: Negative for ear pain, hearing loss and sinus pain.   Eyes: Negative for blurred vision.  Respiratory: Negative for cough, shortness of breath and wheezing.   Cardiovascular: Negative for chest pain and leg swelling.  Gastrointestinal: Negative for abdominal pain, blood in stool, constipation, diarrhea, heartburn, melena, nausea and vomiting.  Genitourinary: Positive for dysuria and frequency.  Musculoskeletal: Negative for back pain and joint pain.  Skin: Negative for rash.  Neurological: Negative for dizziness, tingling, focal weakness and headaches.  Psychiatric/Behavioral: Positive for depression and anxiety  General Healthcare: Medication Compliance: not on any long term medications Dx Hypertension: no Dx Hyperlipidemia: no Diabetes: no Dx Obesity: no Weight Loss: no Physical Activity: minimal Urinary Incontinence: no  Menstrual hx: post menopausal Last dental exam: years. Going again in 2 weeks  Social:  reports that she has been smoking cigarettes.  She started smoking about 16 years ago. She has been  smoking about 1.00 pack per day. she has never used smokeless tobacco. Driving: drivers herself, wears a seatbelt Alcohol Use: socially Tobacco 1 PPD  Other Drugs: marijuana occasionally  Support and Life at Home: yes Advanced Directives: no Work: Pharmacologist Due  Topic Date Due  . PAP SMEAR  02/18/1989  . INFLUENZA VACCINE  10/02/2016    Past Medical History Past Medical History:  Diagnosis Date  . Anemia    history of  . Fibroids    history of uterine fibroids   Patient Active Problem List   Diagnosis Date Noted  . Mass present on one side of neck 06/21/2015  . Cigarette smoker 06/21/2015    Medications- reviewed and updated  Objective: BP 128/78   Pulse 94   Temp 98.7 F (37.1 C) (Oral)   Ht 5\' 5"  (1.651 m)   Wt 203 lb 9.6 oz (92.4 kg)   SpO2 99%   BMI 33.88 kg/m  Gen: In no acute distress, alert, cooperative with exam, well groomed HEENT: NCAT, EOMI, PERRL, poor dentition.  2 inch mass on left side of neck, hard CV: Regular rate and rhythm, normal S1/S2, no murmur Resp: Clear to auscultation bilaterally, no wheezes, non-labored Abd: Soft, Non Tender, Non Distended, bowel sounds present, no guarding or organomegaly Ext: No edema, warm and well perfused Neuro: Alert and oriented, No gross deficits, normal gait Psych: Normal mood and affect  Depression screen PHQ 2/9 04/21/2017  Decreased Interest 2  Down, Depressed, Hopeless 2  PHQ - 2 Score 4  Altered sleeping 3  Tired, decreased energy 0  Change in appetite 0  Feeling bad or failure about yourself  1  Trouble concentrating 0  Moving slowly or fidgety/restless 3  Suicidal thoughts 0  PHQ-9 Score 11    Assessment/Plan:  1. Cigarette smoker - nicotine (NICODERM CQ - DOSED IN MG/24 HOURS) 21 mg/24hr patch; Place 1 patch (21 mg total) onto the skin daily.  Dispense: 28 patch; Refill: 3 -Discussed tobacco cessation and the importance of quitting as soon as possible.  She states  that she will quit tomorrow  2. Breast cancer screening - MM Digital Screening; Future  3. History of anemia: This was in the setting of bleeding prior to her hysterectomy.  Although she has had her uterus out less likely that she has this problem still - CBC - Basic Metabolic Panel - TSH  4. Depression, unspecified depression type: PHQ 9 of 11.  No suicidal ideation.  Symptoms revolving around the passing of her mother about 6 months ago she states she never had time to grieve.  This is past the window of a grief reaction.  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 -Discussed proper sleep hygiene; going to bed at the same time every night and waking up at the same time every morning if possible -Patient agreeable to starting Celexa today, started 20 mg - She will schedule appointment with behavioral health consultant -Follow-up in 2 weeks  5. Screening for HIV (human immunodeficiency virus) - HIV antibody  6. Dysuria: Has signs of UTI -Bactrim as below (allergic to penicillins)  7. Annual physical exam: Overall patient has many new issues to address, most importantly a change in her mood. sEe other assessment and plans.  Patient due for flu shot but she declines at this time.  No need for Pap smear as she is status post hysterectomy. -HIV screening today -Follow-up in 1 year  8.  Mass on left side of neck: Present for over a 2 years now.  Has enlarged in size.  Was told in the past by ENT that it was a salivary stone.  Patient notes that she has follow-up with ENT in 3 days.  Concerned as she is a tobacco abuser. -We will follow-up with ENT as recommendations  Meds ordered this encounter  Medications  . nicotine (NICODERM CQ - DOSED IN MG/24 HOURS) 21 mg/24hr patch    Sig: Place 1 patch (21 mg total) onto the skin daily.    Dispense:  28 patch    Refill:  3  . citalopram (CELEXA) 20 MG tablet    Sig: Take 1 tablet  (20 mg total) by mouth daily.    Dispense:  30 tablet    Refill:  0  . sulfamethoxazole-trimethoprim (BACTRIM DS,SEPTRA DS) 800-160 MG tablet    Sig: Take 1 tablet by mouth 2 (two) times daily for 3 days.    Dispense:  6 tablet    Refill:  0     Smitty Cords, MD [place], PGY-3

## 2017-04-19 LAB — CBC
HEMOGLOBIN: 13 g/dL (ref 11.1–15.9)
Hematocrit: 38.7 % (ref 34.0–46.6)
MCH: 31.6 pg (ref 26.6–33.0)
MCHC: 33.6 g/dL (ref 31.5–35.7)
MCV: 94 fL (ref 79–97)
PLATELETS: 335 10*3/uL (ref 150–379)
RBC: 4.11 x10E6/uL (ref 3.77–5.28)
RDW: 13.9 % (ref 12.3–15.4)
WBC: 10.7 10*3/uL (ref 3.4–10.8)

## 2017-04-19 LAB — TSH: TSH: 1.21 u[IU]/mL (ref 0.450–4.500)

## 2017-04-19 LAB — BASIC METABOLIC PANEL
BUN/Creatinine Ratio: 23 (ref 9–23)
BUN: 19 mg/dL (ref 6–24)
CHLORIDE: 104 mmol/L (ref 96–106)
CO2: 19 mmol/L — AB (ref 20–29)
Calcium: 9 mg/dL (ref 8.7–10.2)
Creatinine, Ser: 0.81 mg/dL (ref 0.57–1.00)
GFR calc Af Amer: 99 mL/min/{1.73_m2} (ref >59)
GFR calc non Af Amer: 86 mL/min/{1.73_m2} (ref >59)
Glucose: 86 mg/dL (ref 65–99)
POTASSIUM: 4.1 mmol/L (ref 3.5–5.2)
SODIUM: 139 mmol/L (ref 134–144)

## 2017-04-19 LAB — HIV ANTIBODY (ROUTINE TESTING W REFLEX): HIV SCREEN 4TH GENERATION: NONREACTIVE

## 2017-04-21 DIAGNOSIS — F329 Major depressive disorder, single episode, unspecified: Secondary | ICD-10-CM | POA: Insufficient documentation

## 2017-04-21 DIAGNOSIS — F32A Depression, unspecified: Secondary | ICD-10-CM | POA: Insufficient documentation

## 2017-04-21 NOTE — Assessment & Plan Note (Signed)
PHQ 9 of 11.  No suicidal ideation.  Symptoms revolving around the passing of her mother about 6 months ago she states she never had time to grieve.  This is past the window of a grief reaction.  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 -Discussed proper sleep hygiene; going to bed at the same time every night and waking up at the same time every morning if possible -Patient agreeable to starting Celexa today, started 20 mg - She will schedule appointment with behavioral health consultant -Follow-up in 2 weeks

## 2017-04-29 ENCOUNTER — Other Ambulatory Visit: Payer: Self-pay | Admitting: Otolaryngology

## 2017-04-29 DIAGNOSIS — K118 Other diseases of salivary glands: Secondary | ICD-10-CM

## 2017-05-07 ENCOUNTER — Other Ambulatory Visit: Payer: Self-pay

## 2017-05-07 ENCOUNTER — Ambulatory Visit: Payer: Managed Care, Other (non HMO) | Admitting: Family Medicine

## 2017-05-07 ENCOUNTER — Encounter: Payer: Self-pay | Admitting: Family Medicine

## 2017-05-07 DIAGNOSIS — F17210 Nicotine dependence, cigarettes, uncomplicated: Secondary | ICD-10-CM | POA: Diagnosis not present

## 2017-05-07 DIAGNOSIS — F32A Depression, unspecified: Secondary | ICD-10-CM

## 2017-05-07 DIAGNOSIS — F329 Major depressive disorder, single episode, unspecified: Secondary | ICD-10-CM | POA: Diagnosis not present

## 2017-05-07 MED ORDER — NICOTINE 21 MG/24HR TD PT24: 21.0000 mg | MEDICATED_PATCH | Freq: Every day | TRANSDERMAL | 3 refills | Status: DC

## 2017-05-07 MED ORDER — CITALOPRAM HYDROBROMIDE 20 MG PO TABS: 20.0000 mg | ORAL_TABLET | Freq: Every day | ORAL | 6 refills | Status: DC

## 2017-05-07 NOTE — Patient Instructions (Signed)
Thank you for coming in today, it was so nice to see you! Today we talked about:    Mood: I am so happy you are feeling better! Please take 1 pill of celexa every day  Your lab results are normal  Congrats on wanting to quit smoking. I have refilled your patches, please let me know if these are still on backorder  Please follow up in 6 months.   If you have any questions or concerns, please do not hesitate to call the office at [phone]. You can also message me directly via MyChart.   Sincerely,  Smitty Cords, MD

## 2017-05-07 NOTE — Progress Notes (Signed)
   Subjective:    Patient ID: Shelley Mason , female   DOB: 1965/09/16 , 52 y.o..   MRN: [id number]  HPI  Shelley Mason is here for  Chief Complaint  Patient presents with  . Depression  . lab results    1. Depression follow up: Patient notes that she has been feeling much better after starting Celexa.  She notes that she feels like the Celexa has helped her to regain control of her mood.  She notes that she is only taking it about 3-4 times a week though.  Her sleep is much better and she does not have any difficulties there.  2.  Tobacco abuse; patient notes that she wants to start quitting as soon as possible.  She tried to pick up her nicotine patches from the pharmacy however they told her that it was on back order.  Review of Systems: Per HPI  Past Medical History: Patient Active Problem List   Diagnosis Date Noted  . Depression 04/21/2017  . Mass present on one side of neck 06/21/2015  . Cigarette smoker 06/21/2015    Medications: reviewed and updated Current Outpatient Medications  Medication Sig Dispense Refill  . citalopram (CELEXA) 20 MG tablet Take 1 tablet (20 mg total) by mouth daily. 30 tablet 6  . ibuprofen (ADVIL,MOTRIN) 200 MG tablet Take 400 mg by mouth every 6 (six) hours as needed for fever. Reported on 06/21/2015    . nicotine (NICODERM CQ - DOSED IN MG/24 HOURS) 21 mg/24hr patch Place 1 patch (21 mg total) onto the skin daily. 28 patch 3   No current facility-administered medications for this visit.     Social Hx:  reports that she has been smoking cigarettes.  She started smoking about 16 years ago. She has been smoking about 1.00 pack per day. she has never used smokeless tobacco.   Objective:   BP 138/80   Pulse 88   Temp 98.7 F (37.1 C) (Oral)   Ht 5\' 5"  (1.651 m)   Wt 204 lb (92.5 kg)   SpO2 99%   BMI 33.95 kg/m  Physical Exam  Gen: NAD, alert, cooperative with exam, well-appearing Psych: good insight, normal mood and  affect  Assessment & Plan:  Depression Patient doing much better after starting Celexa.  PHQ 9 went from 11 last month to 2 today.  She notes that she has not able to gain more control over her mood.  She is also sleeping much better and exercising proper sleep hygiene. -Continue Celexa 20 mg daily -Encouraged her to schedule appointment the behavioral health consultant - Discussed not taking Celexa just a few times a week but instead taking daily - Follow-up in 6 months  Cigarette smoker Continues to smoke 1 pack/day.  Is ready to quit. -Prescription sent again for nicotine patches 21 mg daily - Discussed that she can taper down to the 14 mg patch in 8 weeks  Meds ordered this encounter  Medications  . nicotine (NICODERM CQ - DOSED IN MG/24 HOURS) 21 mg/24hr patch    Sig: Place 1 patch (21 mg total) onto the skin daily.    Dispense:  28 patch    Refill:  3  . citalopram (CELEXA) 20 MG tablet    Sig: Take 1 tablet (20 mg total) by mouth daily.    Dispense:  30 tablet    Refill:  6    Smitty Cords, MD [facility], PGY-3

## 2017-05-07 NOTE — Assessment & Plan Note (Signed)
Patient doing much better after starting Celexa.  PHQ 9 went from 11 last month to 2 today.  She notes that she has not able to gain more control over her mood.  She is also sleeping much better and exercising proper sleep hygiene. -Continue Celexa 20 mg daily -Encouraged her to schedule appointment the behavioral health consultant - Discussed not taking Celexa just a few times a week but instead taking daily - Follow-up in 6 months

## 2017-05-07 NOTE — Assessment & Plan Note (Signed)
Continues to smoke 1 pack/day.  Is ready to quit. -Prescription sent again for nicotine patches 21 mg daily - Discussed that she can taper down to the 14 mg patch in 8 weeks

## 2017-05-19 ENCOUNTER — Ambulatory Visit: Payer: Managed Care, Other (non HMO)

## 2017-05-19 ENCOUNTER — Ambulatory Visit
Admission: RE | Admit: 2017-05-19 | Discharge: 2017-05-19 | Disposition: A | Discharge status: Home / Self Care | Payer: Managed Care, Other (non HMO) | Source: Ambulatory Visit | Attending: Family Medicine | Admitting: Family Medicine

## 2017-05-19 DIAGNOSIS — Z1239 Encounter for other screening for malignant neoplasm of breast: Secondary | ICD-10-CM

## 2017-05-21 ENCOUNTER — Other Ambulatory Visit: Payer: Self-pay | Admitting: Family Medicine

## 2017-05-21 DIAGNOSIS — R928 Other abnormal and inconclusive findings on diagnostic imaging of breast: Secondary | ICD-10-CM

## 2017-05-23 ENCOUNTER — Ambulatory Visit
Admission: RE | Admit: 2017-05-23 | Discharge: 2017-05-23 | Disposition: A | Discharge status: Home / Self Care | Payer: Managed Care, Other (non HMO) | Source: Ambulatory Visit | Attending: Otolaryngology | Admitting: Otolaryngology

## 2017-05-23 ENCOUNTER — Other Ambulatory Visit: Payer: Self-pay | Admitting: Otolaryngology

## 2017-05-23 DIAGNOSIS — K118 Other diseases of salivary glands: Secondary | ICD-10-CM

## 2017-05-26 ENCOUNTER — Ambulatory Visit
Admission: RE | Admit: 2017-05-26 | Discharge: 2017-05-26 | Disposition: A | Discharge status: Home / Self Care | Payer: Managed Care, Other (non HMO) | Source: Ambulatory Visit | Attending: Family Medicine | Admitting: Family Medicine

## 2017-05-26 ENCOUNTER — Other Ambulatory Visit: Payer: Self-pay | Admitting: Family Medicine

## 2017-05-26 DIAGNOSIS — R921 Mammographic calcification found on diagnostic imaging of breast: Secondary | ICD-10-CM

## 2017-05-26 DIAGNOSIS — R928 Other abnormal and inconclusive findings on diagnostic imaging of breast: Secondary | ICD-10-CM

## 2017-06-17 ENCOUNTER — Ambulatory Visit (INDEPENDENT_AMBULATORY_CARE_PROVIDER_SITE_OTHER): Payer: Managed Care, Other (non HMO) | Admitting: Student

## 2017-06-17 ENCOUNTER — Encounter: Payer: Self-pay | Admitting: Student

## 2017-06-17 VITALS — BP 120/78 | HR 73 | Temp 98.9°F | Ht 65.0 in | Wt 200.0 lb

## 2017-06-17 DIAGNOSIS — F17200 Nicotine dependence, unspecified, uncomplicated: Secondary | ICD-10-CM | POA: Diagnosis not present

## 2017-06-17 DIAGNOSIS — J029 Acute pharyngitis, unspecified: Secondary | ICD-10-CM | POA: Diagnosis not present

## 2017-06-17 DIAGNOSIS — K118 Other diseases of salivary glands: Secondary | ICD-10-CM | POA: Insufficient documentation

## 2017-06-17 LAB — POCT RAPID STREP A (OFFICE): Rapid Strep A Screen: NEGATIVE

## 2017-06-17 MED ORDER — AZITHROMYCIN 250 MG PO TABS: ORAL_TABLET | ORAL | 0 refills | Status: AC

## 2017-06-17 MED ORDER — NICOTINE 21 MG/24HR TD PT24: 21.0000 mg | MEDICATED_PATCH | Freq: Every day | TRANSDERMAL | 0 refills | Status: DC

## 2017-06-17 NOTE — Progress Notes (Signed)
Subjective:    Shelley Mason is a 52 y.o. old female here for sore throat  HPI Sore throat: for two days. Difficulty swallowing due to pain. 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 Runny nose from allergy. Denies cough. Denies shortness of breath or chest pain.   Smokes about 0.52 pack a day for 20 years.   PMH/Problem List: has Mass present on one side of neck; Cigarette smoker; Depression; Sore throat; and Tobacco use disorder on their problem list.   has a past medical history of Anemia and Fibroids.  FH:  Family History  Problem Relation Age of Onset  . Hyperlipidemia Mother   . Hypertension Mother   . Heart failure Mother   . Prostate cancer Father   . Hyperlipidemia Sister   . Heart disease Unknown        family history  . Diabetes Unknown        family history  . Hypertension Unknown        family history    SH Social History   Tobacco Use  . Smoking status: Current Every Day Smoker    Packs/day: 1.00    Types: Cigarettes    Start date: 06/20/2000  . Smokeless tobacco: Never Used  Substance Use Topics  . Alcohol use: Yes    Alcohol/week: 0.0 oz    Comment: occasionally  . Drug use: No    Review of Systems Review of systems negative except for pertinent positives and negatives in history of present illness above.     Objective:     Vitals:   06/17/17 0907  BP: 120/78  Pulse: 73  Temp: 98.9 F (37.2 C)  TempSrc: Oral  SpO2: 96%  Weight: 200 lb (90.7 kg)  Height: 5\' 5"  (1.651 m)   Body mass index is 33.28 kg/m.  Physical Exam  GEN: appears well & comfortable. No apparent distress. Head: normocephalic and atraumatic  Eyes: conjunctiva without injection. Sclera anicteric. Nares: no rhinorrhea. No swollen turbinates. No erythema of nasal mucosa Oropharynx: MMM. No erythema. Positive for tonsillar exudation bilaterally.  Uvula midline.  Full range of motion in neck without pain HEM: Significant for anterior cervical lymphadenopathy.  No posterior  cervical lymphadenopathy.  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 Sore throat: patient with fever, tonsillar exudation and anterior cervical lymphadenopathy concerning for strep pharyngitis although her rapid strep is negative.  She has 4/4 Centor's criteria.  History of anaphylaxis with penicillin.  Will treat with Z-Pak. - azithromycin (ZITHROMAX) 250 MG tablet; Take 2 tabs by mouth on day 1, then 1 tab by mouth daily  Dispense: 6 each; Refill: 0  2. Tobacco use disorder: Likes to quit smoking.  She said she was not able to get her nicotine prescription when sent to her pharmacy electronically.  Gave her a paper prescription today.  Gave her a quit line number and recommended follow-up with PCP. - nicotine (NICODERM CQ - DOSED IN MG/24 HOURS) 21 mg/24hr patch; Place 1 patch (21 mg total) onto the skin daily.  Dispense: 28 patch; Refill: 0  3. Obstruction of  parotid duct: chronic issue.  No overlying skin erythema or signs of inflammation.  She says she has an upcoming appointment with ENT.  Return if symptoms worsen or fail to improve.  Mercy Riding, MD 06/17/17 Pager: [phone]

## 2017-06-17 NOTE — Patient Instructions (Signed)
It was great seeing you today! We have addressed the following issues today  Sore throat: This is likely due to strep pharyngitis.  We sent a prescription for azithromycin to your pharmacy.  Please pick up this prescription and start taking.  Tobacco use: We gave you a prescription for nicotine patch.  You can also call 1800-QUIT-NOW for help with stopping smoking.  Please follow-up with your primary care doctor on this.   If we did any lab work today, and the results require attention, either me or my nurse will get in touch with you. If everything is normal, you will get a letter in mail and a message via . If you don't hear from [place] in two weeks, please give [place] a call. Otherwise, we look forward to seeing you again at your next visit. If you have any questions or concerns before then, please call the clinic at [phone].  Please bring all your medications to every doctors visit  Sign up for My Chart to have easy access to your labs results, and communication with your Primary care physician.    Please check-out at the front desk before leaving the clinic.    Take Care,   Dr. Cyndia Skeeters

## 2017-08-04 ENCOUNTER — Telehealth: Payer: Self-pay | Admitting: Family Medicine

## 2017-08-04 NOTE — Telephone Encounter (Signed)
Called pt to find out what she is needing. She will fax a form for [place] to fill out and get back to her. Pt will let us know if we should fax, mail or call her when the form is ready. Ottis Stain, CMA

## 2017-08-04 NOTE — Telephone Encounter (Signed)
Pt needs copy of her last physical on 04/18/17 for her work. Please let pt know when this has been left up front for her.

## 2017-08-08 ENCOUNTER — Other Ambulatory Visit: Payer: Self-pay | Admitting: Otolaryngology

## 2017-09-11 ENCOUNTER — Encounter: Payer: Self-pay | Admitting: Radiation Oncology

## 2017-09-11 ENCOUNTER — Telehealth: Payer: Self-pay | Admitting: *Deleted

## 2017-09-11 NOTE — Telephone Encounter (Signed)
Oncology Nurse Navigator Documentation  Placed introductory call to new referral patient Shelley Mason.  Introduced myself as the H&N oncology nurse navigator that works with Dr. Isidore Moos to whom she has been referred by Dr. Constance Holster.  She confirmed understanding of referral.  I informed her of 7/16 appts:  8:30 NE, 9:00 Consult, 2:00 CT SIM.  I briefly explained my role as her navigator.  I provided overview of RT including purpose of pre-radiotherapy dental evaluation prior to starting RT, indicated she wd be contacted by Dental Medicine to arrange an appt within a day or so of appt with Dr. Isidore Moos.   I confirmed understanding of [place] location, explained arrival and RadOnc registration process for appt.  I provided my contact information, encouraged her to call with questions/concerns before next week.  I explained I will be on vacation beginning next week, provided RN Anderson Malta Malmfelt's phone # in the event she needed to speak with someone prior to Tuesday appt.  She verbalized understanding of information provided, expressed appreciation for my call.  Shelley Orem, RN, BSN Head & Neck Oncology Nurse [facility] [phone]

## 2017-09-15 NOTE — Progress Notes (Signed)
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 SEE COMMENT. - NO EVIDENCE OF CARCINOMA IN 17 TOTAL LYMPH NODES (0/17).  Patient presented with symptoms of: She first presented to Dr. Janace Hoard on 07/17/15 with an enlarging Left facial mass.   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   Nutrition Status Yes No Comments  Weight changes? []  [x]    Swallowing concerns? []  [x]    PEG? []  [x]     Referrals Yes No Comments  Social Work? []  [x]    Dentistry? [x]  []  Being scheduled,   Swallowing therapy? []  [x]    Nutrition? []  [x]    Med/Onc? []  [x]     Safety Issues Yes No Comments  Prior radiation? []  [x]    Pacemaker/ICD? []  [x]    Possible current pregnancy? []  [x]    Is the patient on methotrexate? []  [x]     Tobacco/Marijuana/Snuff/ETOH use: She is a current smoker, one pack daily. She asks about nicotine patches today. She drinks alcohol occasionally.   Past/Anticipated interventions by otolaryngology, if any:  Dr. Constance Holster, Parotidectomy 08/08/17  Past/Anticipated interventions by medical oncology, if any:    Current Complaints / other details:  She would like to discuss something to help her relax during her radiation treatments.  05/23/17 IMPRESSION: 3.2 x 3.0 x 2.6 cm solid mass within the inferior superficial lobe of the left parotid gland. Internally heterogeneous, but reasonably well circumscribed. Most likely diagnosis is benign mixed tumor, but the ultrasound is nonspecific and tissue diagnosis would be suggested  BP (!) 150/98   Pulse 80   Temp 98.1 F (36.7 C)   Resp 18   Ht 5\' 4"  (1.626 m)   Wt 200 lb 6.4 oz (90.9 kg)   SpO2 100% Comment: room air  BMI 34.40 kg/m   Wt Readings from Last 3 Encounters:  09/16/17 200 lb 6.4 oz (90.9 kg)   09/16/17 200 lb 6.4 oz (90.9 kg)  06/17/17 200 lb (90.7 kg)

## 2017-09-16 ENCOUNTER — Ambulatory Visit
Admission: RE | Admit: 2017-09-16 | Discharge: 2017-09-16 | Disposition: A | Discharge status: Home / Self Care | Payer: Managed Care, Other (non HMO) | Source: Ambulatory Visit | Attending: Radiation Oncology | Admitting: Radiation Oncology

## 2017-09-16 ENCOUNTER — Other Ambulatory Visit: Payer: Self-pay

## 2017-09-16 ENCOUNTER — Encounter: Payer: Self-pay | Admitting: Radiation Oncology

## 2017-09-16 VITALS — BP 152/90 | HR 80 | Temp 98.1°F | Resp 18 | Ht 64.0 in | Wt 200.4 lb

## 2017-09-16 VITALS — BP 150/98 | HR 80 | Temp 98.1°F | Resp 18 | Ht 64.0 in | Wt 200.4 lb

## 2017-09-16 DIAGNOSIS — Z510 Encounter for antineoplastic radiation therapy: Secondary | ICD-10-CM | POA: Diagnosis not present

## 2017-09-16 DIAGNOSIS — Z90710 Acquired absence of both cervix and uterus: Secondary | ICD-10-CM | POA: Insufficient documentation

## 2017-09-16 DIAGNOSIS — D490 Neoplasm of unspecified behavior of digestive system: Secondary | ICD-10-CM

## 2017-09-16 DIAGNOSIS — Z8249 Family history of ischemic heart disease and other diseases of the circulatory system: Secondary | ICD-10-CM | POA: Insufficient documentation

## 2017-09-16 DIAGNOSIS — C07 Malignant neoplasm of parotid gland: Secondary | ICD-10-CM

## 2017-09-16 DIAGNOSIS — Z79899 Other long term (current) drug therapy: Secondary | ICD-10-CM | POA: Insufficient documentation

## 2017-09-16 DIAGNOSIS — Z880 Allergy status to penicillin: Secondary | ICD-10-CM | POA: Diagnosis not present

## 2017-09-16 DIAGNOSIS — F17210 Nicotine dependence, cigarettes, uncomplicated: Secondary | ICD-10-CM | POA: Diagnosis not present

## 2017-09-16 DIAGNOSIS — R221 Localized swelling, mass and lump, neck: Secondary | ICD-10-CM

## 2017-09-16 DIAGNOSIS — Z885 Allergy status to narcotic agent status: Secondary | ICD-10-CM | POA: Insufficient documentation

## 2017-09-16 MED ORDER — LORAZEPAM 1 MG PO TABS
1.0000 mg | ORAL_TABLET | Freq: Once | ORAL | Status: DC
  Filled 2017-09-16: qty 1

## 2017-09-16 MED ORDER — LORAZEPAM 1 MG PO TABS
1.0000 mg | ORAL_TABLET | Freq: Once | ORAL | Status: AC
  Administered 2017-09-16: 1 mg via ORAL
  Filled 2017-09-16: qty 1

## 2017-09-16 NOTE — Progress Notes (Signed)
Radiation Oncology         [phone] ________________________________  Initial outpatient Consultation  Name: Shelley Mason MRN: [id number]  Date: 09/16/2017  DOB: 04-04-1965  CC:[facility] Team, MD  Shelley Gala, MD   REFERRING PHYSICIAN: Izora Gala, MD  DIAGNOSIS:    ICD-10-CM   1. 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 Cancer Staging Malignant neoplasm of parotid gland [facility]) Staging form: Major Salivary Glands, AJCC 8th Edition - Clinical: No stage assigned - Unsigned - Pathologic: Stage II (pT2, pN0, cM0) - Signed by Shelley Gibson, MD on 09/17/2017  CHIEF COMPLAINT: Here to discuss management of head and neck cancer  HISTORY OF PRESENT ILLNESS::Shelley Mason is a 52 y.o. female who presented with mass to her left neck that has been present for over 2 years. She was originally seen by Dr. Janace Hoard in 07/2015, but denied parotidectomy at that time.   More recently, in 04/2017 the patient was seen by Shelley Gala, MD. On 05/23/2017 she underwent an U/S of head/neck soft tissues showing 3.2 x 3.0 x 2.6 cm solid mass within the inferior superficial lobe of the left parotid gland. Internally heterogeneous, but reasonably well circumscribed. Most likely diagnosis is benign mixed tumor, but the u/s is nonspecific and tissue diagnosis would be suggested.   On 08/08/2017 she underwent a left parotidectomy. 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 No evidence of carcinoma in 17 total lymph nodes. pT2, pN0.  Shelley Mason is doing well today. She is accompanied  by her sister. 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 She endorses dry mouth and numbness over the surgical site. She has been using biotin, drinking water and drinking olive oil, as suggested by [employer]. She currently smokes 1/ppd since 2002, and would like to quit smoking. She occasionally drinks alcohol.  The patient had a hysterectomy.   PREVIOUS RADIATION THERAPY: No  PAST MEDICAL HISTORY:  has a past medical history of Anemia and Fibroids.    PAST SURGICAL HISTORY: Past Surgical History:  Procedure Laterality Date  . CESAREAN SECTION     x 3  . CYSTOSCOPY    . LAPAROSCOPIC ASSISTED VAGINAL HYSTERECTOMY    . TUBAL LIGATION      FAMILY HISTORY: family history includes Diabetes in her unknown relative; Heart disease in her unknown relative; Heart failure in her mother; Hyperlipidemia in her mother and sister; Hypertension in her mother and unknown relative; Prostate cancer in her father.  SOCIAL HISTORY:  reports that she has been smoking cigarettes.  She started smoking about 17 years ago. She has been smoking about 1.00 pack per day. She has never used smokeless tobacco. She reports that she drinks alcohol. She reports that she does not use drugs.  ALLERGIES: Penicillins and Codeine  MEDICATIONS:  Current Outpatient Medications  Medication Sig Dispense Refill  . buPROPion (WELLBUTRIN SR) 150 MG 12 hr tablet Start one week before quit date. Take 1 tab daily x 3 days, then 1 tab BID thereafter. 60 tablet 2  . citalopram (CELEXA) 20 MG tablet Take 1 tablet (20 mg total) by mouth daily. (Patient  not taking: Reported on 09/16/2017) 30 tablet 6  . ibuprofen (ADVIL,MOTRIN) 200 MG tablet Take 400 mg by mouth every 6 (six) hours as needed for fever. Reported on 06/21/2015    . nicotine (NICODERM CQ - DOSED IN MG/24 HOURS) 14 mg/24hr patch Apply 21 mg patch daily x 6 wk, then 14mg  patch daily x 2 wk, then 7 mg patch daily x 2 wk 14 patch 0  . nicotine (NICODERM CQ -  DOSED IN MG/24 HOURS) 21 mg/24hr patch Apply 21 mg patch daily x 6 wk, then 14mg  patch daily x 2 wk, then 7 mg patch daily x 2 wk 14 patch 2  . nicotine (NICODERM CQ - DOSED IN MG/24 HR) 7 mg/24hr patch Apply 21 mg patch daily x 6 wk, then 14mg  patch daily x 2 wk, then 7 mg patch daily x 2 wk 14 patch 0   No current facility-administered medications for this encounter.     REVIEW OF SYSTEMS:  A 10+ POINT REVIEW OF SYSTEMS WAS OBTAINED including neurology, dermatology, psychiatry, cardiac, respiratory, lymph, extremities, GI, GU, Musculoskeletal, constitutional, breasts, reproductive, HEENT.  All pertinent positives are noted in the HPI.  All others are negative.   PHYSICAL EXAM:  height is 5\' 4"  (1.626 m) and weight is 200 lb 6.4 oz (90.9 kg). Her temperature is 98.1 F (36.7 C). Her blood pressure is 150/98 (abnormal) and her pulse is 80. Her respiration is 18 and oxygen saturation is 100%.   General: Alert and oriented, in no acute distress HEENT: Head is normocephalic. Extraocular movements are intact. Missing a few teeth, but majority are intact. She has metal fillings. Fullness in her left tonsil.  Neck: Surgical scar healed well in the left periauricular region just posterior to the left ear lobe. No palpable masses in her neck. Numbness at surgical scar in the periauricular area.  Heart: Regular in rate and rhythm with no murmurs, rubs, or gallops. Chest: Clear to auscultation bilaterally, with no rhonchi, wheezes, or rales. Abdomen: Soft, nontender, nondistended, with no rigidity or guarding. Extremities: No cyanosis or edema. Lymphatics: see Neck Exam Skin: No concerning lesions. Scar healed well - left parotid bed Musculoskeletal: symmetric strength and muscle tone throughout. Neurologic: Cranial nerves II through XII are grossly intact. No obvious focalities. Speech is fluent. Coordination is intact. Psychiatric: Judgment and insight are intact. Affect is appropriate.   ECOG =  0  0 - Asymptomatic (Fully active, able to carry on all predisease activities without restriction)  1 - Symptomatic but completely ambulatory (Restricted in physically strenuous activity but ambulatory and able to carry out work of a light or sedentary nature. For example, light housework, office work)  2 - Symptomatic, <50% in bed during the day (Ambulatory and capable of all self care but unable to carry out any work activities. Up and about more than 50% of waking hours)  3 - Symptomatic, >50% in bed, but not bedbound (Capable of only limited self-care, confined to bed or chair 50% or more of waking hours)  4 - Bedbound (Completely disabled. Cannot carry on any self-care. Totally confined to bed or chair)  5 - Death   Eustace Pen MM, Creech RH, Tormey DC, et al. [phone]). "Toxicity and response criteria of the [facility]". [place] Oncol. 5 (6): 649-55   LABORATORY DATA:  Lab Results  Component Value Date   WBC 10.7 04/18/2017   HGB 13.0 04/18/2017   HCT 38.7 04/18/2017   MCV 94 04/18/2017  PLT 335 04/18/2017   CMP     Component Value Date/Time   NA 139 04/18/2017 1640   K 4.1 04/18/2017 1640   CL 104 04/18/2017 1640   CO2 19 (L) 04/18/2017 1640   GLUCOSE 86 04/18/2017 1640   GLUCOSE 94 05/17/2013 0924   BUN 19 04/18/2017 1640   CREATININE 0.81 04/18/2017 1640   CALCIUM 9.0 04/18/2017 1640   GFRNONAA 86 04/18/2017 1640   GFRAA 99 04/18/2017 1640         RADIOGRAPHY:  As above - I reviewed [place] of parotid tumor     IMPRESSION/PLAN:  This is a delightful patient with head and neck cancer. I recommend 6.5 weeks of radiotherapy for this patient due to risk of local recurrence..  We discussed the potential risks, benefits, and side effects of radiotherapy. We talked in detail about acute and late effects. We discussed that some of the most bothersome acute effects may be mucositis, dysgeusia, salivary changes, skin irritation, hair loss, dehydration,  weight loss and fatigue. We talked about late effects which include but are not necessarily limited to dysphagia, hypothyroidism, nerve injury, spinal cord injury, xerostomia, trismus, and neck edema. No guarantees of treatment were given. A consent form was signed and placed in the patient's medical record. The patient is enthusiastic about proceeding with treatment. I look forward to participating in the patient's care.    Simulation (treatment planning) will take place later today, 09/16/17 at 2 pm.  I asked the patient today about tobacco use. The patient uses tobacco.  I advised the patient to quit. Services were offered by me today including outpatient counseling and pharmacotherapy. I assessed for the willingness to attempt to quit and provided encouragement and demonstrated willingness to make referrals and/or prescriptions to help the patient attempt to quit. The patient has follow-up with the oncologic team to touch base on their tobacco use and /or cessation efforts.  Over 3 minutes were spent on this issue. She uses the pharmacy at [facility] on [address], [place], [place] [address]. I will send Rx for nicotine patches, Wellbutrin and a sedative to her pharmacy. The patient's quit date for smoking is 09/25/2017.   She has severe claustrophobia. - Ativan Rx'd  The patient had a hysterectomy. Denies pregnancy.  We also discussed that the treatment of head and neck cancer is a multidisciplinary process to maximize treatment outcomes and quality of life. For this reasons the following referrals have been or will be made:  Dentistry for dental evaluation, possible extractions in the radiation fields, and /or advice on reducing risk of cavities, osteoradionecrosis, or other oral issues.  Liliane Channel will be set her up for clinic to see a Nutritionist for nutrition support during and after treatment. Social work for social support. Physical therapy due to risk of lymphedema in neck and  deconditioning.  Audiology referral for baseline hearing prior to RT.  The patient's sister is planning a beach trip leaving in the evening of Thursday 10/09/2017. The patient is unsure if she will go as well. However, I explained it would be okay if she missed one day as long as she told me in advance.   __________________________________________   Shelley Gibson, MD   This document serves as a record of services personally performed by Shelley Gibson, MD. It was created on her behalf by Margit Banda, a trained medical scribe. The creation of this record is based on the scribe's personal observations and the provider's statements to them. This document  has been checked and approved by the attending provider.

## 2017-09-17 ENCOUNTER — Encounter: Payer: Self-pay | Admitting: Radiation Oncology

## 2017-09-17 ENCOUNTER — Other Ambulatory Visit: Payer: Self-pay | Admitting: Radiation Oncology

## 2017-09-17 DIAGNOSIS — C07 Malignant neoplasm of parotid gland: Secondary | ICD-10-CM

## 2017-09-17 DIAGNOSIS — Z85818 Personal history of malignant neoplasm of other sites of lip, oral cavity, and pharynx: Secondary | ICD-10-CM | POA: Insufficient documentation

## 2017-09-17 MED ORDER — NICOTINE 21 MG/24HR TD PT24: MEDICATED_PATCH | TRANSDERMAL | 2 refills | Status: DC

## 2017-09-17 MED ORDER — NICOTINE 14 MG/24HR TD PT24: MEDICATED_PATCH | TRANSDERMAL | 0 refills | Status: DC

## 2017-09-17 MED ORDER — NICOTINE 7 MG/24HR TD PT24: MEDICATED_PATCH | TRANSDERMAL | 0 refills | Status: DC

## 2017-09-17 MED ORDER — BUPROPION HCL ER (SR) 150 MG PO TB12: ORAL_TABLET | ORAL | 2 refills | Status: DC

## 2017-09-17 MED ORDER — LORAZEPAM 0.5 MG PO TABS: ORAL_TABLET | ORAL | 0 refills | Status: DC

## 2017-09-17 NOTE — Progress Notes (Signed)
Head and Neck Cancer Simulation, IMRT treatment planning  Outpatient  Diagnosis:    ICD-10-CM   1. Mass present on one side of neck R22.1 LORazepam (ATIVAN) tablet 1 mg  2. Neoplasm of parotid gland D49.0   3. Malignant neoplasm of parotid gland ([place]) C07     The patient was taken to the CT simulator and laid in the supine position on the table. An Aquaplast head and shoulder mask was custom fitted to the patient's anatomy. High-resolution CT axial imaging was obtained of the head and neck with contrast. I verified that the quality of the imaging is good for treatment planning. 1 Medically Necessary Treatment Device was fabricated and supervised by me: Aquaplast mask.   Treatment planning note I plan to treat the patient with IMRT. I plan to treat the patient's tumor bed in the left neck with added margin on the facial nerve. I plan to treat to a total dose of 64 Gray in 32  fractions. Dose calculation was ordered from dosimetry.  IMRT planning Note  IMRT is medically necessary and an important modality to deliver adequate dose to the patient's at risk tissues while sparing the patient's normal structures, including the: esophagus, parotid tissue, mandible, brain stem, spinal cord, oral cavity.   This justifies the use of IMRT in the patient's treatment.   -----------------------------------  Eppie Gibson, MD

## 2017-09-18 ENCOUNTER — Telehealth: Payer: Self-pay | Admitting: *Deleted

## 2017-09-18 ENCOUNTER — Ambulatory Visit (HOSPITAL_COMMUNITY): Payer: Self-pay | Admitting: Dentistry

## 2017-09-18 ENCOUNTER — Encounter: Payer: Self-pay | Admitting: General Practice

## 2017-09-18 ENCOUNTER — Encounter (HOSPITAL_COMMUNITY): Payer: Self-pay | Admitting: Dentistry

## 2017-09-18 VITALS — BP 147/83 | HR 87 | Temp 98.4°F

## 2017-09-18 DIAGNOSIS — K08409 Partial loss of teeth, unspecified cause, unspecified class: Secondary | ICD-10-CM

## 2017-09-18 DIAGNOSIS — K036 Deposits [accretions] on teeth: Secondary | ICD-10-CM

## 2017-09-18 DIAGNOSIS — K029 Dental caries, unspecified: Secondary | ICD-10-CM | POA: Diagnosis not present

## 2017-09-18 DIAGNOSIS — K0530 Chronic periodontitis, unspecified: Secondary | ICD-10-CM

## 2017-09-18 DIAGNOSIS — K045 Chronic apical periodontitis: Secondary | ICD-10-CM | POA: Diagnosis not present

## 2017-09-18 DIAGNOSIS — C07 Malignant neoplasm of parotid gland: Secondary | ICD-10-CM

## 2017-09-18 DIAGNOSIS — K06020 Generalized gingival recession, unspecified: Secondary | ICD-10-CM

## 2017-09-18 DIAGNOSIS — M264 Malocclusion, unspecified: Secondary | ICD-10-CM

## 2017-09-18 DIAGNOSIS — K0889 Other specified disorders of teeth and supporting structures: Secondary | ICD-10-CM

## 2017-09-18 DIAGNOSIS — M2632 Excessive spacing of fully erupted teeth: Secondary | ICD-10-CM

## 2017-09-18 DIAGNOSIS — Z01818 Encounter for other preprocedural examination: Secondary | ICD-10-CM

## 2017-09-18 DIAGNOSIS — K06010 Localized gingival recession, unspecified: Secondary | ICD-10-CM

## 2017-09-18 NOTE — Telephone Encounter (Signed)
CALLED PATIENT TO INFORM OF APPT. WITH DR. [name] OFFICE ON 09/22/17- ARRIVAL TIME - 1:30 PM FOR BASELINE HEARING TEST, LVM FOR A RETURN CALL

## 2017-09-18 NOTE — Patient Instructions (Signed)

## 2017-09-18 NOTE — Progress Notes (Signed)
[place] Psychosocial Distress Screening Clinical Social Work  Clinical Social Work was referred by distress screening protocol.  The patient scored a 6 on the Psychosocial Distress Thermometer which indicates moderate distress. Clinical Social Worker contacted patient by phone to assess for distress and other psychosocial needs. Unable to reach patient by phone, left VM explaining purpose of outreach call and requesting call back.    ONCBCN DISTRESS SCREENING 09/16/2017  Screening Type Initial Screening  Distress experienced in past week (1-10) 6  Information Concerns Type Lack of info about diagnosis;Lack of info about treatment    Clinical Social Worker follow up needed: Yes.    If yes, follow up plan:  Await return call.   Beverely Pace, [place], LCSW Clinical Social Worker Phone:  [phone]

## 2017-09-18 NOTE — Progress Notes (Signed)
DENTAL CONSULTATION  Date of Consultation:  09/18/2017 Patient Name:   Shelley Mason Date of Birth:   1965-12-27 Medical Record Number: [id number]  VITALS: BP (!) 147/83 (BP Location: Right Arm)   Pulse 87   Temp 98.4 F (36.9 C)   CHIEF COMPLAINT: Patient referred by Dr. Isidore Moos for a dental consultation.  HPI: Shelley Mason is a 52 year old female recently diagnosed with malignant neoplasm involving the left parotid gland. Patient is status post left parotidectomy by Dr. Constance Holster on August 08, 2017.  Patient with anticipated postoperative radiation therapy with Dr. Isidore Moos. Patient is now seen as part of a preradiation therapy dental protocol examination.  The patient currently denies acute toothaches, swellings, or abscesses. Patient is not seen a dentist for " many years".  This is at least 10-15 years by patient report. Patient had a tooth pulled at that time with no complications. Patient denies having a primary dentist. Patient denies having partial dentures. 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  PROBLEM LIST: Patient Active Problem List   Diagnosis Date Noted  . Malignant neoplasm of parotid gland ([place]) 09/17/2017    Priority: High  . Malignant tumor of parotid gland ([place]) 09/17/2017  . Sore throat 06/17/2017  . Tobacco use disorder 06/17/2017  . Obstruction of parotid duct 06/17/2017  . Depression 04/21/2017  . Mass present on one side of neck 06/21/2015  . Cigarette smoker 06/21/2015    PMH: Past Medical History:  Diagnosis Date  . Anemia    history of  . Fibroids    history of uterine fibroids    PSH: Past Surgical History:  Procedure Laterality Date  . CESAREAN SECTION     x 3  . CYSTOSCOPY    . LAPAROSCOPIC ASSISTED VAGINAL HYSTERECTOMY    . TUBAL LIGATION      ALLERGIES: Allergies  Allergen Reactions  . Penicillins Anaphylaxis and Rash  . Codeine Rash    MEDICATIONS: Current Outpatient Medications  Medication Sig Dispense Refill  . buPROPion  (WELLBUTRIN SR) 150 MG 12 hr tablet Start one week before quit date. Take 1 tab daily x 3 days, then 1 tab BID thereafter. 60 tablet 2  . citalopram (CELEXA) 20 MG tablet Take 1 tablet (20 mg total) by mouth daily. (Patient not taking: Reported on 09/16/2017) 30 tablet 6  . ibuprofen (ADVIL,MOTRIN) 200 MG tablet Take 400 mg by mouth every 6 (six) hours as needed for fever. Reported on 06/21/2015    . LORazepam (ATIVAN) 0.5 MG tablet Take 1 tab prior to radiotherapy PRN anxiety. 20 tablet 0  . nicotine (NICODERM CQ - DOSED IN MG/24 HOURS) 14 mg/24hr patch Apply 21 mg patch daily x 6 wk, then 14mg  patch daily x 2 wk, then 7 mg patch daily x 2 wk 14 patch 0  . nicotine (NICODERM CQ - DOSED IN MG/24 HOURS) 21 mg/24hr patch Apply 21 mg patch daily x 6 wk, then 14mg  patch daily x 2 wk, then 7 mg patch daily x 2 wk 14 patch 2  . nicotine (NICODERM CQ - DOSED IN MG/24 HR) 7 mg/24hr patch Apply 21 mg patch daily x 6 wk, then 14mg  patch daily x 2 wk, then 7 mg patch daily x 2 wk 14 patch 0   No current facility-administered medications for this visit.     LABS: Lab Results  Component Value Date   WBC 10.7 04/18/2017   HGB 13.0 04/18/2017   HCT 38.7 04/18/2017   MCV 94 04/18/2017   PLT  335 04/18/2017      Component Value Date/Time   NA 139 04/18/2017 1640   K 4.1 04/18/2017 1640   CL 104 04/18/2017 1640   CO2 19 (L) 04/18/2017 1640   GLUCOSE 86 04/18/2017 1640   GLUCOSE 94 05/17/2013 0924   BUN 19 04/18/2017 1640   CREATININE 0.81 04/18/2017 1640   CALCIUM 9.0 04/18/2017 1640   GFRNONAA 86 04/18/2017 1640   GFRAA 99 04/18/2017 1640   No results found for: INR, PROTIME No results found for: PTT  SOCIAL HISTORY: Social History   Socioeconomic History  . Marital status: Married    Spouse name: Not on file  . Number of children: 3  . Years of education: Not on file  . Highest education level: Not on file  Occupational History    Employer: [employer]  Social Needs  . Financial  resource strain: Not on file  . Food insecurity:    Worry: Not on file    Inability: Not on file  . Transportation needs:    Medical: Not on file    Non-medical: Not on file  Tobacco Use  . Smoking status: Current Every Day Smoker    Packs/day: 1.00    Types: Cigarettes    Start date: 06/20/2000  . Smokeless tobacco: Never Used  Substance and Sexual Activity  . Alcohol use: Yes    Alcohol/week: 0.0 oz    Comment: occasionally  . Drug use: No  . Sexual activity: Yes    Birth control/protection: None, Surgical  Lifestyle  . Physical activity:    Days per week: Not on file    Minutes per session: Not on file  . Stress: Not on file  Relationships  . Social connections:    Talks on phone: Not on file    Gets together: Not on file    Attends religious service: Not on file    Active member of club or organization: Not on file    Attends meetings of clubs or organizations: Not on file    Relationship status: Not on file  . Intimate partner violence:    Fear of current or ex partner: Not on file    Emotionally abused: Not on file    Physically abused: Not on file    Forced sexual activity: Not on file  Other Topics Concern  . Not on file  Social History Narrative  . Not on file    FAMILY HISTORY: Family History  Problem Relation Age of Onset  . Hyperlipidemia Mother   . Hypertension Mother   . Heart failure Mother   . Prostate cancer Father   . Hyperlipidemia Sister   . Heart disease Unknown        family history  . Diabetes Unknown        family history  . Hypertension Unknown        family history    REVIEW OF SYSTEMS: Reviewed with the patient as per History of present illness. Psych: Patient denies having dental phobia, but does have needle phobia.  DENTAL HISTORY: CHIEF COMPLAINT: Patient referred by Dr. Isidore Moos for a dental consultation.  HPI: Shelley Mason is a 52 year old female recently diagnosed with malignant neoplasm involving the left parotid  gland. Patient is status post left parotidectomy by Dr. Constance Holster on August 08, 2017.  Patient with anticipated postoperative radiation therapy with Dr. Isidore Moos. Patient is now seen as part of a preradiation therapy dental protocol examination.  The patient currently denies acute toothaches,  swellings, or abscesses. Patient is not seen a dentist for " many years".  This is at least 10-15 years by patient report. Patient had a tooth pulled at that time with no complications. Patient denies having a primary dentist. Patient denies having partial dentures. 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  DENTAL EXAMINATION: GENERAL:  The patient is a well-developed, well-nourished female in no acute distress HEAD AND NECK:  There is no right neck lymphadenopathy. The left neck is consistent with previous surgery by Dr. Constance Holster. Patient denies acute TMJ symptoms. INTRAORAL EXAM:  The patient has normal saliva. There is no evidence of oral abscess formation. DENTITION:  The patient is missing tooth numbers 1, 13, 16, 17, 19, 28, 31, and 32.  Multiple diastemas are noted. PERIODONTAL:  The patient has chronic periodontitis with plaque and calculus accumulations, gingival recession, and incipient mandibular anterior tooth mobility. There is incipient to moderate bone loss noted. DENTAL CARIES/SUBOPTIMAL RESTORATIONS:  Dental caries are noted as per dental charting form. ENDODONTIC:  The patient denies acute pulpitis symptoms. The patient does have periapical pathology and radiolucency associated with tooth #30.  The patient has had previous root canal therapy associated with tooth #9. CROWN AND BRIDGE:  There are no crown or bridge restorations. Patient could benefit from an aesthetic crown on tooth #9. PROSTHODONTIC:  The patient denies having partial dentures. OCCLUSION:  The patient has a poor occlusal scheme secondary to multiple missing teeth, supra-eruption and drifting of the unopposed teeth into the edentulous areas, multiple  diastemas, and lack of replacement of all missing teeth with dental prostheses.  RADIOGRAPHIC INTERPRETATION: An orthopantogram was taken and supplemented with a full series of dental radiographs. There are multiple missing teeth. There is supra-eruption and drifting of the unopposed teeth into the edentulous areas. There are multiple diastemas. Dental caries are noted. There is periapical radiolucency at the apex of tooth #30. There is incipient to moderate bone loss noted.   ASSESSMENTS: 1. Left parotid cancer-status post left partial parotidectomy 2. Pre-radiation therapy dental protocol 3. Chronic apical periodontitis 4. Dental caries 5. Chronic periodontitis with bone loss 6. Accretions 7. Gingival recession 8. Incipient mandibular anterior tooth mobility 9. Multiple missing teeth 10. Multiple diastemas 11. Supra-eruption and drifting of the unopposed teeth into the edentulous areas 12. Poor occlusal scheme and malocclusion  PLAN/RECOMMENDATIONS: 1. I discussed the risks, benefits, and complications of various treatment options with the patient in relationship to her medical and dental conditions, anticipated radiation therapy, and radiation therapy side effects to include xerostomia, radiation caries, trismus, mucositis, taste changes, gum and jawbone changes, and risk for infection and osteoradionecrosis. We discussed various treatment options to include no treatment, multiple extractions with alveoloplasty, pre-prosthetic surgery as indicated, periodontal therapy, dental restorations, root canal therapy, crown and bridge therapy, implant therapy, and replacement of missing teeth as indicated. Ideally, the patient could benefit from extraction of tooth numbers 2 and 30 with alveoloplasty and gross debridement of remaining dentition the operating room with general anesthesia. However, after discussion with Dr. Isidore Moos, it was determined that the radiation therapy needs to start as soon as  possible. Since the patient is asymptomatic and currently without evidence of intraoral swellingsymptoms it is felt that starting radiation therapy would be prudent. I discussed this with the patient and she is in agreement with starting radiation therapy as soon as possible to treat the cancer. Patient will let Dr. Isidore Moos or Dr. Enrique Sack know if acute dental symptoms arise. The plan is to provide dental treatment  once she is stable from completing the anticipated radiation therapy.     2. Discussion of findings with medical team and coordination of future medical and dental care as needed.  I spent in excess of  120 minutes during the conduct of this consultation and >50% of this time involved direct face-to-face encounter for counseling and/or coordination of the patient's care.    Lenn Cal, DDS

## 2017-09-19 ENCOUNTER — Telehealth: Payer: Self-pay | Admitting: General Practice

## 2017-09-19 ENCOUNTER — Encounter: Payer: Self-pay | Admitting: General Practice

## 2017-09-19 NOTE — Telephone Encounter (Signed)
[place] Psychosocial Distress Screening Clinical Social Work  Clinical Social Work was referred by distress screening protocol.  The patient scored a 6 on the Psychosocial Distress Thermometer which indicates moderate distress. Clinical Social Worker contacted patient by phone to assess for distress and other psychosocial needs. Is currently out of work since early June due to cancer diagnosis and treatment.  Will receive limited short term disability from her job.  Currently behind on bills/rent, struggling to stretch income which is less than when she is working.  Cannot return to work until Oct 2019.  Has support from family/friends, sister will transport to treatments.  Major concerns are financial due to inability to work during treatment.  Has applied for and been approved for Food Stamps, will be receiving shortly.  Referred to agencies who can assist via [place] as well as referral to [facility] for short term help.  Linked w [employer] for small grant, suggested that she ask to speak w Financial Advocate during treatment visits so she can be assessed for [employer].  Mailed information packet on [employer] and CSW contact information.     ONCBCN DISTRESS SCREENING 09/16/2017  Screening Type Initial Screening  Distress experienced in past week (1-10) 6  Information Concerns Type Lack of info about diagnosis;Lack of info about treatment    Clinical Social Worker follow up needed: Yes.    If yes, follow up plan:  Monitor referrals, provide food bag from pantry to assist until her Food Stamps card becomes active.    Beverely Pace, [place], LCSW Clinical Social Worker Phone:  [phone]

## 2017-09-22 ENCOUNTER — Ambulatory Visit
Admission: RE | Admit: 2017-09-22 | Discharge: 2017-09-22 | Disposition: A | Discharge status: Home / Self Care | Payer: Managed Care, Other (non HMO) | Source: Ambulatory Visit | Attending: Radiation Oncology | Admitting: Radiation Oncology

## 2017-09-22 ENCOUNTER — Encounter: Payer: Self-pay | Admitting: General Practice

## 2017-09-22 ENCOUNTER — Encounter: Payer: Self-pay | Admitting: *Deleted

## 2017-09-22 DIAGNOSIS — C07 Malignant neoplasm of parotid gland: Secondary | ICD-10-CM | POA: Diagnosis not present

## 2017-09-22 MED ORDER — SONAFINE EX EMUL
1.0000 "application " | Freq: Once | CUTANEOUS | Status: AC
  Administered 2017-09-22: 1 via TOPICAL

## 2017-09-22 NOTE — Progress Notes (Signed)
Oncology Nurse Navigator Documentation  Met with Shelley Mason during initial Tomo tmt. She tolerated tmt without difficulty, denied questions/concerns.  She noted she had taken lorazepam prior to tmt. I confirmed her 0800 arrival tomorrow morning for H&N [facility], explained registration/arrival procedures. I escorted her to Nursing for weekly PUT with Dr. Isidore Moos.  [name], RN, BSN Head & Neck Oncology Nurse [place] at [place] [phone]

## 2017-09-22 NOTE — Progress Notes (Signed)
Pt here for patient teaching.  Pt given Radiation and You booklet, Managing Acute Radiation Side Effects for Head and Neck Cancer handout, skin care instructions and Sonafine.  Reviewed areas of pertinence such as fatigue, mouth changes, skin changes and taste changes . Pt able to give teach back of to pat skin, use unscented/gentle soap and drink plenty of water,apply Sonafine bid, avoid applying anything to skin within 4 hours of treatment and to use an electric razor if they must shave. Pt verbalizes understanding of information given and will contact nursing with any questions or concerns.     Http://rtanswers.org/treatmentinformation/whattoexpect/index

## 2017-09-22 NOTE — Progress Notes (Signed)
[place] CSW Progress Notes  Call to patient Teacher, English as a foreign language has no more emergency assistance funds at this time, declined referral.  [employer] will assess patient and assist as they are able.  Gave patient contact number for [employer].  Patient also given information on food pantry, will request food bag from [facility] food pantry when at [facility] today for first treatment.  Reports being anxious about treatment today, encouragement and support offered.  Edwyna Shell, LCSW Clinical Social Worker Phone:  [phone]

## 2017-09-23 ENCOUNTER — Ambulatory Visit: Payer: Managed Care, Other (non HMO) | Attending: Radiation Oncology | Admitting: Physical Therapy

## 2017-09-23 ENCOUNTER — Ambulatory Visit
Admission: RE | Admit: 2017-09-23 | Discharge: 2017-09-23 | Disposition: A | Discharge status: Home / Self Care | Payer: Managed Care, Other (non HMO) | Source: Ambulatory Visit | Attending: Radiation Oncology | Admitting: Radiation Oncology

## 2017-09-23 ENCOUNTER — Encounter: Payer: Self-pay | Admitting: *Deleted

## 2017-09-23 ENCOUNTER — Telehealth: Payer: Self-pay | Admitting: *Deleted

## 2017-09-23 ENCOUNTER — Other Ambulatory Visit: Payer: Self-pay

## 2017-09-23 ENCOUNTER — Inpatient Hospital Stay: Payer: Managed Care, Other (non HMO) | Attending: Radiation Oncology | Admitting: Nutrition

## 2017-09-23 DIAGNOSIS — R29898 Other symptoms and signs involving the musculoskeletal system: Secondary | ICD-10-CM | POA: Insufficient documentation

## 2017-09-23 DIAGNOSIS — C07 Malignant neoplasm of parotid gland: Secondary | ICD-10-CM | POA: Diagnosis not present

## 2017-09-23 DIAGNOSIS — Z9189 Other specified personal risk factors, not elsewhere classified: Secondary | ICD-10-CM | POA: Diagnosis present

## 2017-09-23 NOTE — Telephone Encounter (Signed)
Oncology Nurse Navigator Documentation  Rec'd report for audiogram ordered by Dr. Isidore Moos 7/17, conducted 7/22 at [facility].  Dr. Isidore Moos informed.  [name], RN, BSN Head & Neck Oncology Nurse [place] at [place] [phone]

## 2017-09-23 NOTE — Progress Notes (Signed)
Oncology Nurse Navigator Documentation  Met with Shelley Mason upon her arrival for H&N [name].  She was accompanied by her sister.  Provided verbal and written overview of MDC, the clinicians who will be seeing her, encouraged her to ask questions during her time with them.  She was seen by Nutrition, PT and SW.  She will meet with RadOnc Investment banker, operational tomorrow.  Spoke with her at end of [facility], addressed questions. I encouraged her to call me with needs/concerns.  [name], RN, BSN Head & Neck Oncology [place] at [place] [phone]

## 2017-09-23 NOTE — Progress Notes (Signed)
Patient was seen during head and neck clinic.  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   Patient to receive adjuvant radiation therapy.  Past medical history includes anemia, tobacco, alcohol.  Medications include Wellbutrin, and Celexa.  Labs were reviewed.  Height: 64 inches. Weight: 199.6 pounds. Usual body weight: Around 200 pounds. BMI: 34.4.  Patient denies nutrition impact symptoms.  Nutrition diagnosis: Food and nutrition related knowledge deficit related to parotid gland cancer and associated treatments as evidenced by no prior need for nutrition related information.  Intervention: Patient was educated to strive for weight maintenance by consuming high-protein diet along with adequate calories. Educated patient on function of protein for healing. Provided fact sheets. Encourage patient to explore options for protein shakes. Questions were answered.  Teach back method used.  Contact information given.  Monitoring, evaluation, goals: Patient will tolerate adequate calories and protein to minimize weight loss throughout treatment.  Next visit: To be scheduled weekly.  .**Disclaimer: This note was dictated with voice recognition software. Similar sounding words can inadvertently be transcribed and this note may contain transcription errors which may not have been corrected upon publication of note.**

## 2017-09-23 NOTE — Therapy (Signed)
[place], [place], [address] Phone: [phone]   Fax:  [phone]  Physical Therapy Evaluation  Patient Details  Name: Shelley Mason MRN: [id number] Date of Birth: June 26, 1965 Referring Provider: Dr. Eppie Gibson   Encounter Date: 09/23/2017  PT End of Session - 09/23/17 0926    Visit Number  1    Number of Visits  1    PT Start Time  0828    PT Stop Time  0853    PT Time Calculation (min)  25 min    Activity Tolerance  Patient tolerated treatment well    Behavior During Therapy  [facility] for tasks assessed/performed       Past Medical History:  Diagnosis Date  . Anemia    history of  . Fibroids    history of uterine fibroids    Past Surgical History:  Procedure Laterality Date  . CESAREAN SECTION     x 3  . CYSTOSCOPY    . LAPAROSCOPIC ASSISTED VAGINAL HYSTERECTOMY    . TUBAL LIGATION      There were no vitals filed for this visit.   Subjective Assessment - 09/23/17 0906    Subjective  I walk every day. I can't sit still.    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 She presented with a mass in May 2017 but refused parotidectomy at that time. She was seen more recently and had parotidectomy 08/08/17 with 17 nodes removed (all negative). She started adjuvant RT on 09/22/17 and will have 32 treatments. Smoker who is trying to quit.     Patient Stated Goals  get info from all clinic providers    Currently in Pain?  No/denies         [facility] PT Assessment - 09/23/17 0001      Assessment   Medical Diagnosis  left parotid carcinoma    Referring Provider  Dr. Eppie Gibson    Onset Date/Surgical Date  08/08/17    Prior Therapy  none      Precautions   Precautions  Other (comment)    Precaution Comments  cancer precautions      Restrictions   Weight Bearing Restrictions  No      Balance  Screen   Has the patient fallen in the past 6 months  No    Has the patient had a decrease in activity level because of a fear of falling?   No    Is the patient reluctant to leave their home because of a fear of falling?   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    Leisure  walks daily, 3-4,000 steps      Cognition   Overall Cognitive Status  Within Functional Limits for tasks assessed      Observation/Other Assessments   Observations  energetic woman      Coordination   Gross Motor Movements are Fluid and Coordinated  Yes      Functional Tests  Functional tests  Sit to Stand      Sit to Stand   Comments  15 times in 30 seconds, below average for her age no dyspnea following; some fatigue in legs      Posture/Postural Control   Posture/Postural Control  No significant limitations      ROM / Strength   AROM / PROM / Strength  AROM      AROM   Overall AROM Comments  neck and shoulder AROM WFL bilat. throughout but with slight tightness bilateral neck sidebend      Ambulation/Gait   Ambulation/Gait  Yes    Ambulation/Gait Assistance  7: Independent walks quickly        LYMPHEDEMA/ONCOLOGY QUESTIONNAIRE - 09/23/17 0922      Type   Cancer Type  parotid carcinoma      Surgeries   Other Surgery Date  08/08/17      Treatment   Active Radiation Treatment  Yes    Date  09/22/17    Body Site  tumor bed and added margin on facial nerve      Lymphedema Assessments   Lymphedema Assessments  Head and Neck      Head and Neck   4 cm superior to sternal notch around neck  37.6 cm    6 cm superior to sternal notch around neck  38.2 cm    8 cm superior to sternal notch around  neck  39.7 cm    Other  41.2 at 10 cm superior to sternal notch             Objective measurements completed on examination: See above findings.              PT Education - 09/23/17 0925    Education Details  neck ROM, posture, breathing, walking for exercise, CURE article on staying active, "Why exercise?" flyer, lymphedema and PT info    Person(s) Educated  Patient;Other (comment) pt.'s sister    Methods  Explanation;Handout    Comprehension  Verbalized understanding              Head and Neck Clinic Goals - 09/23/17 1017      Patient will be able to verbalize understanding of a home exercise program for cervical range of motion, posture, and walking.    Status  Achieved      Patient will be able to verbalize understanding of proper sitting and standing posture.    Status  Achieved      Patient will be able to verbalize understanding of lymphedema risk and availability of treatment for this condition.    Status  Achieved         Plan - 09/23/17 0930    Clinical Impression Statement  Very pleasant and energetic woman with diagnosis of left parotid carcinoma. She has had parotidectomy with 17 negative lymph nodes removed and has just started adjuvant RT. She report walking 3-4,000 steps daily. She scored below average on 30 second sit to stand and has mildly tight cervical sidebend.    History and Personal Factors relevant to plan of care:  none    Clinical Presentation  Evolving    Clinical Presentation due to:  just starting adjuvant radiation therapy    Clinical Decision Making  Low    Rehab Potential  Excellent    PT Frequency  One time visit    PT Treatment/Interventions  Patient/family education    PT Next Visit Plan  no follow up planned now,  but patient is at high risk for lymphedema and so many need therapy following treatment    PT Home Exercise Plan  neck ROM, walking, posture    Consulted and Agree with Plan of Care   Patient       Patient will benefit from skilled therapeutic intervention in order to improve the following deficits and impairments:  Other (comment), Decreased range of motion, Decreased knowledge of precautions(below average score on 30 second sit to stand; at risk for lymphedema)  Visit Diagnosis: Cancer of parotid gland ([place]) - Plan: PT plan of care cert/re-cert  At risk for lymphedema - Plan: PT plan of care cert/re-cert  Other symptoms and signs involving the musculoskeletal system - Plan: PT plan of care cert/re-cert     Problem List Patient Active Problem List   Diagnosis Date Noted  . Malignant tumor of parotid gland ([place]) 09/17/2017  . Malignant neoplasm of parotid gland ([place]) 09/17/2017  . Sore throat 06/17/2017  . Tobacco use disorder 06/17/2017  . Obstruction of parotid duct 06/17/2017  . Depression 04/21/2017  . Mass present on one side of neck 06/21/2015  . Cigarette smoker 06/21/2015    Arie Powell 09/23/2017, 10:19 AM  [place], [place], [address] Phone: [phone]   Fax:  [phone]  Name: Shelley Mason MRN: [id number] Date of Birth: 11-25-1965  Serafina Royals, PT 09/23/17 10:19 AM

## 2017-09-23 NOTE — Progress Notes (Signed)
[place] Work  Holiday representative received phone call from patient requesting a copy of the "consent form" for her appointment with [employer].  CSW is not aware of the consent form, as referral was not made through [place] [employer] was not an option in the system).  CSW left a voicemail with [employer] representative to get additional information and requested return call.   Johnnye Lana, MSW, LCSW, OSW-C Clinical Social Worker [facility] [phone]

## 2017-09-23 NOTE — Progress Notes (Signed)
On 09-23-17 fax medical records  To [facility]ra it was consult note, sim and planning note

## 2017-09-24 ENCOUNTER — Encounter: Payer: Self-pay | Admitting: *Deleted

## 2017-09-24 ENCOUNTER — Ambulatory Visit
Admission: RE | Admit: 2017-09-24 | Discharge: 2017-09-24 | Disposition: A | Discharge status: Home / Self Care | Payer: Managed Care, Other (non HMO) | Source: Ambulatory Visit | Attending: Radiation Oncology | Admitting: Radiation Oncology

## 2017-09-24 DIAGNOSIS — C07 Malignant neoplasm of parotid gland: Secondary | ICD-10-CM | POA: Diagnosis not present

## 2017-09-25 ENCOUNTER — Ambulatory Visit
Admission: RE | Admit: 2017-09-25 | Discharge: 2017-09-25 | Disposition: A | Discharge status: Home / Self Care | Payer: Managed Care, Other (non HMO) | Source: Ambulatory Visit | Attending: Radiation Oncology | Admitting: Radiation Oncology

## 2017-09-25 DIAGNOSIS — C07 Malignant neoplasm of parotid gland: Secondary | ICD-10-CM | POA: Diagnosis not present

## 2017-09-25 NOTE — Progress Notes (Signed)
FMLA successfully faxed to [facility] at [phone]. Mailed copy to patient address on file.

## 2017-09-26 ENCOUNTER — Ambulatory Visit
Admission: RE | Admit: 2017-09-26 | Discharge: 2017-09-26 | Disposition: A | Discharge status: Home / Self Care | Payer: Managed Care, Other (non HMO) | Source: Ambulatory Visit | Attending: Radiation Oncology | Admitting: Radiation Oncology

## 2017-09-26 DIAGNOSIS — C07 Malignant neoplasm of parotid gland: Secondary | ICD-10-CM | POA: Diagnosis not present

## 2017-09-26 NOTE — Progress Notes (Signed)
Head & Neck Multidisciplinary Clinic Clinical Social Work  Clinical Social Work met with patient/family at head & neck multidisciplinary clinic to offer support and assess for psychosocial needs.  Ms. Shelley Mason was accompanied by her sister.  The patient shared she has a very supportive family.  She reported feeling as though she's emotionally coping well with her diagnosis, but is most concerned with finances.  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  She is having difficulty paying bills-encouraged her to meet with Ailene Ravel, financial advocate.  CSW wrote letter to present to [facility] for rent assistance.  CSW enrolled patient in [place] for food gift card.  Clinical Social Work briefly discussed Clinical Social Work role and [employer] support programs/services.  Clinical Social Work encouraged patient to call with any additional questions or concerns.   Maryjean Morn, MSW, LCSW, OSW-C Clinical Social Worker [facility] [phone]

## 2017-09-29 ENCOUNTER — Ambulatory Visit
Admission: RE | Admit: 2017-09-29 | Discharge: 2017-09-29 | Disposition: A | Discharge status: Home / Self Care | Payer: Managed Care, Other (non HMO) | Source: Ambulatory Visit | Attending: Radiation Oncology | Admitting: Radiation Oncology

## 2017-09-29 DIAGNOSIS — C07 Malignant neoplasm of parotid gland: Secondary | ICD-10-CM | POA: Diagnosis not present

## 2017-09-30 ENCOUNTER — Ambulatory Visit
Admission: RE | Admit: 2017-09-30 | Discharge: 2017-09-30 | Disposition: A | Discharge status: Home / Self Care | Payer: Managed Care, Other (non HMO) | Source: Ambulatory Visit | Attending: Radiation Oncology | Admitting: Radiation Oncology

## 2017-09-30 DIAGNOSIS — C07 Malignant neoplasm of parotid gland: Secondary | ICD-10-CM | POA: Diagnosis not present

## 2017-10-01 ENCOUNTER — Ambulatory Visit
Admission: RE | Admit: 2017-10-01 | Discharge: 2017-10-01 | Disposition: A | Discharge status: Home / Self Care | Payer: Managed Care, Other (non HMO) | Source: Ambulatory Visit | Attending: Radiation Oncology | Admitting: Radiation Oncology

## 2017-10-01 DIAGNOSIS — C07 Malignant neoplasm of parotid gland: Secondary | ICD-10-CM | POA: Diagnosis not present

## 2017-10-02 ENCOUNTER — Ambulatory Visit
Admission: RE | Admit: 2017-10-02 | Discharge: 2017-10-02 | Disposition: A | Discharge status: Home / Self Care | Payer: Managed Care, Other (non HMO) | Source: Ambulatory Visit | Attending: Radiation Oncology | Admitting: Radiation Oncology

## 2017-10-02 DIAGNOSIS — C07 Malignant neoplasm of parotid gland: Secondary | ICD-10-CM | POA: Diagnosis present

## 2017-10-02 DIAGNOSIS — Z510 Encounter for antineoplastic radiation therapy: Secondary | ICD-10-CM | POA: Diagnosis not present

## 2017-10-03 ENCOUNTER — Ambulatory Visit
Admission: RE | Admit: 2017-10-03 | Discharge: 2017-10-03 | Disposition: A | Discharge status: Home / Self Care | Payer: Managed Care, Other (non HMO) | Source: Ambulatory Visit | Attending: Radiation Oncology | Admitting: Radiation Oncology

## 2017-10-03 ENCOUNTER — Encounter: Payer: Self-pay | Admitting: *Deleted

## 2017-10-03 DIAGNOSIS — Z510 Encounter for antineoplastic radiation therapy: Secondary | ICD-10-CM | POA: Diagnosis not present

## 2017-10-03 NOTE — Progress Notes (Signed)
Oncology Nurse Navigator Documentation  Ms. [place] spoke with me s/p RT, reported L face sharp pain that interfered with sleep last HS.  She indicated pain did not resolve with Tylenol, needed alternative analgesic. I brought her to Nursing for assessment but was notified by RN Tamina she left exam room, wanted to wait until Monday to see Dr. Isidore Moos. I met her at [place], she indicated she had 9:40 appt in [facility], could not wait.  I explained Dr. Isidore Moos on vacation next week, that she can be seen by one of her partners.  She refused my offer to return later, noting nicely  "I'll just deal with it." I encouraged her to call RadOnc on-call this weekend if needed, provided [phone] phone number.   She voiced understanding.  [name], RN, BSN Head & Neck Oncology Nurse [place] at [place] [phone]

## 2017-10-06 ENCOUNTER — Ambulatory Visit
Admission: RE | Admit: 2017-10-06 | Discharge: 2017-10-06 | Disposition: A | Discharge status: Home / Self Care | Payer: Managed Care, Other (non HMO) | Source: Ambulatory Visit | Attending: Radiation Oncology | Admitting: Radiation Oncology

## 2017-10-06 DIAGNOSIS — Z510 Encounter for antineoplastic radiation therapy: Secondary | ICD-10-CM | POA: Diagnosis not present

## 2017-10-07 ENCOUNTER — Telehealth: Payer: Self-pay | Admitting: *Deleted

## 2017-10-07 ENCOUNTER — Inpatient Hospital Stay: Payer: Managed Care, Other (non HMO) | Attending: Radiation Oncology

## 2017-10-07 ENCOUNTER — Encounter: Payer: Self-pay | Admitting: *Deleted

## 2017-10-07 ENCOUNTER — Other Ambulatory Visit: Payer: Self-pay | Admitting: Radiation Oncology

## 2017-10-07 ENCOUNTER — Ambulatory Visit
Admission: RE | Admit: 2017-10-07 | Discharge: 2017-10-07 | Disposition: A | Discharge status: Home / Self Care | Payer: Managed Care, Other (non HMO) | Source: Ambulatory Visit | Attending: Radiation Oncology | Admitting: Radiation Oncology

## 2017-10-07 DIAGNOSIS — Z510 Encounter for antineoplastic radiation therapy: Secondary | ICD-10-CM | POA: Diagnosis not present

## 2017-10-07 MED ORDER — HYDROCODONE-ACETAMINOPHEN 7.5-325 MG/15ML PO SOLN: 10.0000 mL | Freq: Four times a day (QID) | ORAL | 0 refills | Status: DC | PRN

## 2017-10-07 MED ORDER — LIDOCAINE VISCOUS HCL 2 % MT SOLN: 15.0000 mL | OROMUCOSAL | 1 refills | Status: DC | PRN

## 2017-10-07 NOTE — Progress Notes (Signed)
Nutrition Follow-up:  Patient with parotid gland cancer.  Patient receiving adjuvant radiation therapy.    Met with patient following radiation today in clinic.  Patient voice hoarse and reports sore throat.  Reports that she can't tolerate boost/ensure shakes due to excess gas and diarrhea.  Reports that she has been buying simple smoothies, eating potato soups, mashed potatoes and eggs.    Medications: reviewed, lidocaine added today  Labs: no new  Anthropometrics:   Weight per chart 200 lb on 7/16.  Patient reports has lost a few pounds recently   NUTRITION DIAGNOSIS: Food and nutrition related knowledge deficit improving   INTERVENTION:  Provided samples of ensure clear to try. Discussed alternative milk options (lactose free milk, etc) and patient not interested in trying.   Encouraged patient to try chopping, pureeing, grinding foods and adding liquid to provide smooth consistency and ease of swallowing.     MONITORING, EVALUATION, GOAL: patient will tolerate adequate calories and protein to minimize weight loss throughout treatment   NEXT VISIT: Friday, August 16   Arabia Nylund B. Zenia Resides, [place], [place] Registered Dietitian [phone] (pager)

## 2017-10-07 NOTE — Telephone Encounter (Signed)
Oncology Nurse Navigator Documentation  Called pt per Dr. Sondra Come re Hycet Rxed this morning and her hx of rash with codeine. She explained rash rxt years ago, had resolution with OTC benadryl. I informed Dr. Earney Hamburg providing same guidance if she has similarly mild rxt, encourages stopping Hycet if rxt more sever.  She voiced understanding.  [name], RN, BSN Head & Neck Oncology Nurse [place] at [place] [phone]

## 2017-10-07 NOTE — Progress Notes (Addendum)
Oncology Nurse Navigator Documentation  In support of care continuity, met with Shelley Mason during weekly PUT with Dr. Daralene Milch. Squire's absence. She completed 12 of 32 RT fractions today to L parotid. She reported:  Intermittent R neck pain which is intense at times.  Some swallowing discomfort.  Tmts continue without difficulty.  Concern re today's 12:00 Nutrition appt when RT at 9:00, she is unable to make repeat trip. Interventions:  Dr. Earney Hamburg Rxed viscous lidocaine and Hycet.  I facilitated visit with RD Joli s/p PUT. I encouraged her to call me with needs/concerns as tmts continue.  She voiced understanding.  Shelley Orem, RN, BSN Head & Neck Oncology Nurse [place] at [place] [phone]

## 2017-10-08 ENCOUNTER — Ambulatory Visit
Admission: RE | Admit: 2017-10-08 | Discharge: 2017-10-08 | Disposition: A | Discharge status: Home / Self Care | Payer: Managed Care, Other (non HMO) | Source: Ambulatory Visit | Attending: Radiation Oncology | Admitting: Radiation Oncology

## 2017-10-08 DIAGNOSIS — Z510 Encounter for antineoplastic radiation therapy: Secondary | ICD-10-CM | POA: Diagnosis not present

## 2017-10-09 ENCOUNTER — Ambulatory Visit
Admission: RE | Admit: 2017-10-09 | Discharge: 2017-10-09 | Disposition: A | Discharge status: Home / Self Care | Payer: Managed Care, Other (non HMO) | Source: Ambulatory Visit | Attending: Radiation Oncology | Admitting: Radiation Oncology

## 2017-10-09 DIAGNOSIS — Z510 Encounter for antineoplastic radiation therapy: Secondary | ICD-10-CM | POA: Diagnosis not present

## 2017-10-10 ENCOUNTER — Ambulatory Visit
Admission: RE | Admit: 2017-10-10 | Discharge: 2017-10-10 | Disposition: A | Discharge status: Home / Self Care | Payer: Managed Care, Other (non HMO) | Source: Ambulatory Visit | Attending: Radiation Oncology | Admitting: Radiation Oncology

## 2017-10-10 DIAGNOSIS — Z510 Encounter for antineoplastic radiation therapy: Secondary | ICD-10-CM | POA: Diagnosis not present

## 2017-10-13 ENCOUNTER — Ambulatory Visit: Admission: RE | Admit: 2017-10-13 | Payer: Managed Care, Other (non HMO) | Source: Ambulatory Visit

## 2017-10-13 ENCOUNTER — Encounter (HOSPITAL_COMMUNITY): Payer: Self-pay | Admitting: *Deleted

## 2017-10-13 ENCOUNTER — Emergency Department (HOSPITAL_COMMUNITY)
Admission: EM | Admit: 2017-10-13 | Discharge: 2017-10-14 | Disposition: A | Discharge status: Home / Self Care | Payer: Managed Care, Other (non HMO) | Attending: Emergency Medicine | Admitting: Emergency Medicine

## 2017-10-13 ENCOUNTER — Emergency Department (HOSPITAL_COMMUNITY): Payer: Managed Care, Other (non HMO)

## 2017-10-13 ENCOUNTER — Telehealth: Payer: Self-pay | Admitting: *Deleted

## 2017-10-13 DIAGNOSIS — E860 Dehydration: Secondary | ICD-10-CM | POA: Diagnosis not present

## 2017-10-13 DIAGNOSIS — H6690 Otitis media, unspecified, unspecified ear: Secondary | ICD-10-CM | POA: Diagnosis not present

## 2017-10-13 DIAGNOSIS — N3000 Acute cystitis without hematuria: Secondary | ICD-10-CM | POA: Diagnosis not present

## 2017-10-13 DIAGNOSIS — F17210 Nicotine dependence, cigarettes, uncomplicated: Secondary | ICD-10-CM | POA: Diagnosis not present

## 2017-10-13 DIAGNOSIS — R42 Dizziness and giddiness: Secondary | ICD-10-CM

## 2017-10-13 DIAGNOSIS — R55 Syncope and collapse: Secondary | ICD-10-CM | POA: Diagnosis present

## 2017-10-13 LAB — URINALYSIS, ROUTINE W REFLEX MICROSCOPIC
BILIRUBIN URINE: NEGATIVE
Glucose, UA: NEGATIVE mg/dL
Ketones, ur: NEGATIVE mg/dL
LEUKOCYTES UA: NEGATIVE
NITRITE: POSITIVE — AB
PH: 6 (ref 5.0–8.0)
Protein, ur: NEGATIVE mg/dL
SPECIFIC GRAVITY, URINE: 1.023 (ref 1.005–1.030)

## 2017-10-13 LAB — COMPREHENSIVE METABOLIC PANEL
ALK PHOS: 52 U/L (ref 38–126)
ALT: 13 U/L (ref 0–44)
ANION GAP: 7 (ref 5–15)
AST: 23 U/L (ref 15–41)
Albumin: 3.7 g/dL (ref 3.5–5.0)
BUN: 13 mg/dL (ref 6–20)
CALCIUM: 8.9 mg/dL (ref 8.9–10.3)
CO2: 23 mmol/L (ref 22–32)
Chloride: 110 mmol/L (ref 98–111)
Creatinine, Ser: 0.71 mg/dL (ref 0.44–1.00)
GFR calc Af Amer: >60 mL/min (ref >60)
GFR calc non Af Amer: >60 mL/min (ref >60)
GLUCOSE: 96 mg/dL (ref 70–99)
Potassium: 4.5 mmol/L (ref 3.5–5.1)
Sodium: 140 mmol/L (ref 135–145)
Total Bilirubin: 1 mg/dL (ref 0.3–1.2)
Total Protein: 6.9 g/dL (ref 6.5–8.1)

## 2017-10-13 LAB — CBC
HEMATOCRIT: 38.2 % (ref 36.0–46.0)
Hemoglobin: 12.7 g/dL (ref 12.0–15.0)
MCH: 32 pg (ref 26.0–34.0)
MCHC: 33.2 g/dL (ref 30.0–36.0)
MCV: 96.2 fL (ref 78.0–100.0)
Platelets: 292 10*3/uL (ref 150–400)
RBC: 3.97 MIL/uL (ref 3.87–5.11)
RDW: 13.6 % (ref 11.5–15.5)
WBC: 7.3 10*3/uL (ref 4.0–10.5)

## 2017-10-13 LAB — CBG MONITORING, ED: GLUCOSE-CAPILLARY: 79 mg/dL (ref 70–99)

## 2017-10-13 MED ORDER — SODIUM CHLORIDE 0.9 % IV BOLUS
1000.0000 mL | Freq: Once | INTRAVENOUS | Status: AC
  Administered 2017-10-13: 1000 mL via INTRAVENOUS

## 2017-10-13 MED ORDER — MECLIZINE HCL 25 MG PO TABS
25.0000 mg | ORAL_TABLET | Freq: Once | ORAL | Status: AC
  Administered 2017-10-13: 25 mg via ORAL
  Filled 2017-10-13: qty 1

## 2017-10-13 NOTE — ED Notes (Signed)
ED Provider at bedside. 

## 2017-10-13 NOTE — ED Notes (Signed)
Patient transported to CT 

## 2017-10-13 NOTE — Telephone Encounter (Signed)
Oncology Nurse Navigator Documentation  In follow-up to call from [place] indicating pt was No Show for 9:00 RT, called Ms. Lizardi to check on her well-being.   She indicated she missed appt b/c did not return from weekend trip to beach when expected.  She noted also she went to Urgent Care for assessment of shoulder b/c she fell.   I encouraged her to call me or RTT in future if unable to keep appt.  She voiced understanding. I updated Tomo and RN [employer].  [name], RN, BSN Head & Neck Oncology Nurse [place] at [place] [phone]

## 2017-10-13 NOTE — ED Provider Notes (Signed)
[facility] DEPT Provider Note   CSN: [id number] Arrival date & time: 10/13/17  2047     History   Chief Complaint Chief Complaint  Patient presents with  . Loss of Consciousness    HPI Shelley Mason is a 52 y.o. female with a history of parotid gland cancer, uterine fibroids who presents to the emergency department with a chief complaint of syncope.  The patient reports a syncopal episode that occurred after getting out of bed this morning.  She reports that she was feeling dizzy and lightheaded when she went to stand before "blacking out".  She states that the next thing she recalls was that she was laying on the floor with a significant pain in her right shoulder.  She states that she thinks she hit her shoulder on the corner of a dresser.  She reports that a family member came in the room after the fall, but was otherwise unwitnessed.  She was able to get up, but states that she felt very off balance with walking.  She reports that she went back and laid down and noted dizziness that lasted for approximately 30 seconds when she went from laying down to sitting upright.  She reports this is happened multiple times throughout the day when going from laying to sitting.  She also reports that she has felt very off balance like she might topple over or "like she's floating" with walking.  She reports the symptoms persisted until she sits down.  She reports that a female friend told her that while in she was sitting up right that she had several seconds of her eyes starting back and forth, but no additional episodes.  She reports that later today she was up walking which she continued to feel really dizzy and had one episode of nonbloody, nonbilious emesis.  She reports that she has been undergoing radiation for parotid gland cancer for the last 3 weeks.  She has not been eating or drinking well because her throat is dry and it is painful to swallow.  She  states that her mouth feels extremely dry.  She reports that she was at the beach this weekend and spent 3 hours outdoors yesterday, but was not of the direct sun.  No history of similar symptoms.  She denies headache, numbness, weakness, diplopia, blurred vision, tinnitus, otalgia, abdominal pain, chest pain, dyspnea, neck pain or stiffness.   She continues to endorse right shoulder pain and is unable to fully lift her shoulder.  She denies right elbow or wrist pain.  No treatment prior to arrival.  The history is provided by the patient. No language interpreter was used.    Past Medical History:  Diagnosis Date  . Anemia    history of  . Cancer ([place])   . Fibroids    history of uterine fibroids    Patient Active Problem List   Diagnosis Date Noted  . 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 Sore throat 06/17/2017  . Tobacco use disorder 06/17/2017  . Obstruction of parotid duct 06/17/2017  . Depression 04/21/2017  . Mass present on one side of neck 06/21/2015  . Cigarette smoker 06/21/2015    Past Surgical History:  Procedure Laterality Date  . CESAREAN SECTION     x 3  . CYSTOSCOPY    . LAPAROSCOPIC ASSISTED VAGINAL HYSTERECTOMY    . TUBAL LIGATION  OB History   None      Home Medications    Prior to Admission medications   Medication Sig Start Date End Date Taking? Authorizing Provider  HYDROcodone-acetaminophen (HYCET) 7.5-325 mg/15 ml solution Take 10 mLs by mouth 4 (four) times daily as needed for moderate pain. 10/07/17  Yes Gery Pray, MD  LORazepam (ATIVAN) 0.5 MG tablet Take 1 tab prior to radiotherapy PRN anxiety. 09/17/17  Yes Eppie Gibson, MD  buPROPion [facility] SR) 150 MG 12 hr tablet Start one week before quit date. Take 1 tab daily x 3 days, then 1 tab BID thereafter. 09/17/17   Eppie Gibson, MD  citalopram (CELEXA) 20 MG tablet Take 1 tablet (20 mg total) by mouth  daily. Patient not taking: Reported on 10/13/2017 05/07/17   Carlyle Dolly, MD  lidocaine (XYLOCAINE) 2 % solution Use as directed 15 mLs in the mouth or throat as needed for mouth pain (prior to meals). Swish and swallow Patient not taking: Reported on 10/13/2017 10/07/17   Gery Pray, MD  nicotine (NICODERM CQ - DOSED IN MG/24 HOURS) 14 mg/24hr patch Apply 21 mg patch daily x 6 wk, then 14mg  patch daily x 2 wk, then 7 mg patch daily x 2 wk Patient not taking: Reported on 09/18/2017 09/17/17   Eppie Gibson, MD  nicotine (NICODERM CQ - DOSED IN MG/24 HOURS) 21 mg/24hr patch Apply 21 mg patch daily x 6 wk, then 14mg  patch daily x 2 wk, then 7 mg patch daily x 2 wk Patient not taking: Reported on 09/18/2017 09/17/17   Eppie Gibson, MD  nicotine (NICODERM CQ - DOSED IN MG/24 HR) 7 mg/24hr patch Apply 21 mg patch daily x 6 wk, then 14mg  patch daily x 2 wk, then 7 mg patch daily x 2 wk Patient not taking: Reported on 09/18/2017 09/17/17   Eppie Gibson, MD    Family History Family History  Problem Relation Age of Onset  . Hyperlipidemia Mother   . Hypertension Mother   . Heart failure Mother   . Prostate cancer Father   . Hyperlipidemia Sister   . Heart disease Unknown        family history  . Diabetes Unknown        family history  . Hypertension Unknown        family history    Social History Social History   Tobacco Use  . Smoking status: Current Every Day Smoker    Packs/day: 1.00    Types: Cigarettes    Start date: 06/20/2000  . Smokeless tobacco: Never Used  Substance Use Topics  . Alcohol use: Yes    Alcohol/week: 0.0 standard drinks    Comment: occasionally  . Drug use: No     Allergies   Penicillins and Codeine   Review of Systems Review of Systems  Constitutional: Negative for activity change, chills and fever.  HENT: Positive for sore throat. Negative for congestion.        Xerostomia  Eyes: Negative for photophobia and visual disturbance.  Respiratory:  Negative for shortness of breath.   Cardiovascular: Negative for chest pain.  Gastrointestinal: Positive for nausea and vomiting. Negative for abdominal pain, constipation and diarrhea.  Genitourinary: Negative for dysuria.  Musculoskeletal: Positive for arthralgias and myalgias. Negative for back pain, joint swelling, neck pain and neck stiffness.  Skin: Negative for rash and wound.  Allergic/Immunologic: Negative for immunocompromised state.  Neurological: Positive for dizziness, syncope and light-headedness. Negative for weakness, numbness and headaches.  Psychiatric/Behavioral: Negative  for confusion.   Physical Exam Updated Vital Signs BP (!) 151/85 (BP Location: Left Arm)   Pulse 79   Temp 98.2 F (36.8 C) (Oral)   Resp 18   SpO2 96%   Physical Exam  Constitutional: She is oriented to person, place, and time. No distress.  HENT:  Head: Normocephalic.  Right Ear: Hearing normal. Tympanic membrane is bulging. Tympanic membrane is not erythematous. A middle ear effusion is present.  Left Ear: Hearing normal. Tympanic membrane is erythematous and bulging.  Mucous membranes are dry.  Eyes: Conjunctivae are normal.  Neck: Normal range of motion. Neck supple.  Cardiovascular: Normal rate, regular rhythm, normal heart sounds and intact distal pulses. Exam reveals no gallop and no friction rub.  No murmur heard. Pulmonary/Chest: Effort normal and breath sounds normal. No stridor. No respiratory distress. She has no wheezes. She has no rales. She exhibits no tenderness.  Abdominal: Soft. She exhibits no distension and no mass. There is no tenderness. There is no rebound and no guarding. No hernia.  Musculoskeletal:  Diffusely tender to palpation to the right shoulder.  Decreased active and passive range of motion secondary to pain.  Normal exam of the right elbow and wrist.  Sensation to the bilateral upper extremities is symmetric and intact.  Radial pulses are 2+ and symmetric.  No  tenderness to the cervical, thoracic, lumbar spinous processes.  Neurological: She is alert and oriented to person, place, and time.  Cranial nerves II through XII are grossly intact.  5 out of 5 strength against resistance of all muscle groups of the bilateral upper and lower extremities.  Sensation is intact and equal throughout.  Finger-to-nose is intact bilaterally.  Heel-to-shin is normal.  No clonus bilaterally.  DTRs are 2+ and symmetric.  No pronator drift.  Skin: Skin is warm. Capillary refill takes 2 to 3 seconds. No rash noted. She is not diaphoretic.  Psychiatric: Her behavior is normal.  Nursing note and vitals reviewed.  ED Treatments / Results  Labs (all labs ordered are listed, but only abnormal results are displayed) Labs Reviewed  URINALYSIS, ROUTINE W REFLEX MICROSCOPIC - Abnormal; Notable for the following components:      Result Value   APPearance HAZY (*)    Hgb urine dipstick MODERATE (*)    Nitrite POSITIVE (*)    Bacteria, UA FEW (*)    All other components within normal limits  URINE CULTURE  CBC  COMPREHENSIVE METABOLIC PANEL  CBG MONITORING, ED    EKG EKG Interpretation  Date/Time:  Monday October 13 2017 20:59:36 EDT Ventricular Rate:  83 PR Interval:    QRS Duration: 94 QT Interval:  370 QTC Calculation: 435 R Axis:   70 Text Interpretation:  Sinus rhythm Probable left atrial enlargement Borderline T abnormalities, anterior leads since last tracing no significant change Confirmed by Daleen Bo [phone]) on 10/13/2017 9:23:27 PM   Radiology Dg Shoulder Right  Result Date: 10/13/2017 CLINICAL DATA:  Hypertensive episode today with right shoulder pain, initial encounter EXAM: RIGHT SHOULDER - 2+ VIEW COMPARISON:  03/17/2009 FINDINGS: Degenerative changes of the acromioclavicular joint are seen. No acute fracture or dislocation is noted. The underlying bony thorax is within normal limits. IMPRESSION: No acute abnormality noted. Electronically Signed    By: Inez Catalina M.D.   On: 10/13/2017 23:22   Ct Head Wo Contrast  Result Date: 10/13/2017 CLINICAL DATA:  Syncopal episode. Dizziness. Unsure if hit head. Currently on radiation treatment for parotid cancer. EXAM: CT HEAD  WITHOUT CONTRAST TECHNIQUE: Contiguous axial images were obtained from the base of the skull through the vertex without intravenous contrast. COMPARISON:  None. FINDINGS: Brain: No evidence of acute infarction, hemorrhage, hydrocephalus, extra-axial collection or mass lesion/mass effect. Vascular: No hyperdense vessel or unexpected calcification. Skull: Normal. Negative for fracture or focal lesion. Sinuses/Orbits: Mucosal thickening and retention cysts in the maxillary antra. Paranasal sinuses and mastoid air cells are otherwise clear. Other: None. IMPRESSION: No acute intracranial abnormalities. Electronically Signed   By: Lucienne Capers M.D.   On: 10/13/2017 23:44    Procedures Procedures (including critical care time)  Medications Ordered in ED Medications  sodium chloride 0.9 % bolus 1,000 mL (1,000 mLs Intravenous New Bag/Given 10/13/17 2332)  sodium chloride 0.9 % bolus 1,000 mL (has no administration in time range)  meclizine (ANTIVERT) tablet 25 mg (25 mg Oral Given 10/13/17 2332)     Initial Impression / Assessment and Plan / ED Course  I have reviewed the triage vital signs and the nursing notes.  Pertinent labs & imaging results that were available during my care of the patient were reviewed by me and considered in my medical decision making (see chart for details).     52 year old female history of parotid gland cancer, uterine fibroids presenting to the emergency department with a chief complaint of syncope, dizziness, lightheadedness, vomiting, and right shoulder pain.  On exam, no focal neurologic deficits.  Gait deferred at this time.  The patient appears dehydrated.  EKG unchanged from previous.  Right shoulder x-ray is negative.  Head CT is  negative.  Labs are pending at this time. Patient care transferred to [place] at the end of my shift. Patient presentation, ED course, and plan of care discussed with review of all pertinent labs and imaging. Please see his/her note for further details regarding further ED course and appropriate disposition.  Final Clinical Impressions(s) / ED Diagnoses   Final diagnoses:  None    ED Discharge Orders    None       McDonald, Mia A, PA-C 10/14/17 0019    Daleen Bo, MD 10/14/17 1110

## 2017-10-13 NOTE — ED Provider Notes (Signed)
Care assumed from [facility] shift change, please see her note for full details, but in brief Shelley Mason is a 52 y.o. female history of parotid cancer currently receiving radiation in this area, who presents after a syncopal episode.  Patient has not been eating and drinking well and was out in the sun for several hours yesterday.  Did have prodrome of dizziness before passing out today, complaining of right shoulder pain and unsure if she hit her ahead.  Patient continues to report severe dizziness with position changes and difficulty with balance upon standing she has had one episode of emesis. Per previous provider patient was not truly orthostatic, but was very symptomatic with standing here in the ED.  Patient concerning for orthostatic syncope, versus carotid sinus, vs vertigo.  Basic labs, UA, as well as CT of the head and x-ray of the right shoulder pending at signout.  2 L fluid bolus ordered as well as meclizine.  Plan: follow up labs and imaging, reassess after fluids, meclizine, ambulate  Labs Reviewed  URINALYSIS, ROUTINE W REFLEX MICROSCOPIC - Abnormal; Notable for the following components:      Result Value   APPearance HAZY (*)    Hgb urine dipstick MODERATE (*)    Nitrite POSITIVE (*)    Bacteria, UA FEW (*)    All other components within normal limits  URINE CULTURE  CBC  COMPREHENSIVE METABOLIC PANEL  CBG MONITORING, ED   Dg Shoulder Right  Result Date: 10/13/2017 CLINICAL DATA:  Hypertensive episode today with right shoulder pain, initial encounter EXAM: RIGHT SHOULDER - 2+ VIEW COMPARISON:  03/17/2009 FINDINGS: Degenerative changes of the acromioclavicular joint are seen. No acute fracture or dislocation is noted. The underlying bony thorax is within normal limits. IMPRESSION: No acute abnormality noted. Electronically Signed   By: Inez Catalina M.D.   On: 10/13/2017 23:22   Ct Head Wo Contrast  Result Date: 10/13/2017 CLINICAL DATA:  Syncopal episode.  Dizziness. Unsure if hit head. Currently on radiation treatment for parotid cancer. EXAM: CT HEAD WITHOUT CONTRAST TECHNIQUE: Contiguous axial images were obtained from the base of the skull through the vertex without intravenous contrast. COMPARISON:  None. FINDINGS: Brain: No evidence of acute infarction, hemorrhage, hydrocephalus, extra-axial collection or mass lesion/mass effect. Vascular: No hyperdense vessel or unexpected calcification. Skull: Normal. Negative for fracture or focal lesion. Sinuses/Orbits: Mucosal thickening and retention cysts in the maxillary antra. Paranasal sinuses and mastoid air cells are otherwise clear. Other: None. IMPRESSION: No acute intracranial abnormalities. Electronically Signed   By: Lucienne Capers M.D.   On: 10/13/2017 23:44    MDM   CT head unremarkable, and films of the right shoulder show no evidence of acute fracture or dislocation.  Labs show no leukocytosis, normal hemoglobin, no acute electrolyte derangements, normal renal and liver function, CBG within normal limits.  Urinalysis is positive for nitrites with some bacteria present, given patient's immunocompromise state we will plan to treat this.  On exam patient also has some erythema and bulging of her TMs, worse on the left than the right, I do think this could be a combination of infection as well as inflammation from patient's radiation.  Discussed with pharmacy and there is no one antibiotic to treat both of these conditions given patient's anaphylactic reaction to penicillins, so we will treat with Cipro and doxycycline per pharmacy.  On reevaluation after meclizine and fluid bolus, patient ambulated in the hallway and felt much more stable with only minimal dizziness.  At this time I feel she is stable for discharge home with close follow-up with her oncologist and primary doctor, provided prescriptions for Cipro, doxy and meclizine.  Encouraged to drink plenty of fluids.  Return precautions  discussed.  Patient and family expressed understanding and are in agreement with this plan.  Final diagnoses:  Syncope and collapse  Dehydration  Vertigo  Acute cystitis without hematuria  Acute otitis media, unspecified otitis media type    ED Discharge Orders         Ordered    ciprofloxacin (CIPRO) 500 MG tablet  2 times daily     10/14/17 0143    doxycycline (VIBRAMYCIN) 100 MG capsule  2 times daily     10/14/17 0143    meclizine (ANTIVERT) 25 MG tablet  3 times daily PRN     10/14/17 0143            Jacqlyn Larsen, PA-C 10/14/17 8088    Fatima Blank, MD 10/14/17 [phone]

## 2017-10-13 NOTE — ED Triage Notes (Addendum)
Pt reports syncopal episode this morning when she got up to go the the bathroom. She says she woke up face down on the floor and had hit her right shoulder on the dresser, unsure if she hit her head. She c/o shoulder pain at this time. She says just before arrival she had an episode of vomiting. Pt is currently receiving radiation treatments for parotid gland cancer (last treatment Friday).

## 2017-10-14 ENCOUNTER — Ambulatory Visit
Admission: RE | Admit: 2017-10-14 | Discharge: 2017-10-14 | Disposition: A | Discharge status: Home / Self Care | Payer: Managed Care, Other (non HMO) | Source: Ambulatory Visit | Attending: Radiation Oncology | Admitting: Radiation Oncology

## 2017-10-14 DIAGNOSIS — Z510 Encounter for antineoplastic radiation therapy: Secondary | ICD-10-CM | POA: Diagnosis not present

## 2017-10-14 MED ORDER — DOXYCYCLINE HYCLATE 100 MG PO CAPS: 100.0000 mg | ORAL_CAPSULE | Freq: Two times a day (BID) | ORAL | 0 refills | Status: DC

## 2017-10-14 MED ORDER — CIPROFLOXACIN HCL 500 MG PO TABS
500.0000 mg | ORAL_TABLET | Freq: Once | ORAL | Status: AC
  Administered 2017-10-14: 500 mg via ORAL
  Filled 2017-10-14: qty 1

## 2017-10-14 MED ORDER — SODIUM CHLORIDE 0.9 % IV BOLUS
1000.0000 mL | Freq: Once | INTRAVENOUS | Status: AC
  Administered 2017-10-14: 1000 mL via INTRAVENOUS

## 2017-10-14 MED ORDER — CIPROFLOXACIN HCL 500 MG PO TABS: 500.0000 mg | ORAL_TABLET | Freq: Two times a day (BID) | ORAL | 0 refills | Status: AC

## 2017-10-14 MED ORDER — MECLIZINE HCL 25 MG PO TABS: 25.0000 mg | ORAL_TABLET | Freq: Three times a day (TID) | ORAL | 0 refills | Status: DC | PRN

## 2017-10-14 MED ORDER — DOXYCYCLINE HYCLATE 100 MG PO TABS
100.0000 mg | ORAL_TABLET | Freq: Once | ORAL | Status: AC
  Administered 2017-10-14: 100 mg via ORAL
  Filled 2017-10-14: qty 1

## 2017-10-14 NOTE — Discharge Instructions (Signed)
Your evaluation today is very reassuring, imaging of your head and right shoulder do not show any evidence of acute injury.  I think your dizziness with a combination of vertigo from inner ear irritation and infection, and dehydration.  Your labs also show mild signs of a urinary tract infection.  Please take doxycycline twice daily for the next week for ear infection, and see if this helps improve with your vertigo symptoms, you could also have some inner ear inflammation from your radiation treatments.  Please take Cipro as directed for urinary tract infection.  Make sure you are drinking plenty of fluids.  You may use meclizine as needed for dizziness.  Please follow-up with your oncologist and primary care doctor in the next few days for recheck.  Return to the emergency department for any additional syncopal episodes, worsening dizziness, severe headache or vision changes, fevers, chest pain, shortness of breath or any other new concerning symptoms.

## 2017-10-14 NOTE — ED Notes (Signed)
Pt ambulated with one assistance down hallway. Pt denied any dizziness at this time.

## 2017-10-15 ENCOUNTER — Ambulatory Visit
Admission: RE | Admit: 2017-10-15 | Discharge: 2017-10-15 | Disposition: A | Discharge status: Home / Self Care | Payer: Managed Care, Other (non HMO) | Source: Ambulatory Visit | Attending: Radiation Oncology | Admitting: Radiation Oncology

## 2017-10-15 DIAGNOSIS — Z510 Encounter for antineoplastic radiation therapy: Secondary | ICD-10-CM | POA: Diagnosis not present

## 2017-10-16 ENCOUNTER — Ambulatory Visit
Admission: RE | Admit: 2017-10-16 | Discharge: 2017-10-16 | Disposition: A | Discharge status: Home / Self Care | Payer: Managed Care, Other (non HMO) | Source: Ambulatory Visit | Attending: Radiation Oncology | Admitting: Radiation Oncology

## 2017-10-16 DIAGNOSIS — Z510 Encounter for antineoplastic radiation therapy: Secondary | ICD-10-CM | POA: Diagnosis not present

## 2017-10-16 LAB — URINE CULTURE

## 2017-10-17 ENCOUNTER — Telehealth: Payer: Self-pay | Admitting: *Deleted

## 2017-10-17 ENCOUNTER — Inpatient Hospital Stay: Payer: Managed Care, Other (non HMO) | Admitting: Nutrition

## 2017-10-17 ENCOUNTER — Ambulatory Visit
Admission: RE | Admit: 2017-10-17 | Discharge: 2017-10-17 | Disposition: A | Discharge status: Home / Self Care | Payer: Managed Care, Other (non HMO) | Source: Ambulatory Visit | Attending: Radiation Oncology | Admitting: Radiation Oncology

## 2017-10-17 DIAGNOSIS — Z510 Encounter for antineoplastic radiation therapy: Secondary | ICD-10-CM | POA: Diagnosis not present

## 2017-10-17 NOTE — Progress Notes (Signed)
Nutrition follow-up completed with patient receiving radiation therapy to her parotid gland. Patient reports altered taste. She denies nausea, vomiting, diarrhea, and constipation. Weight is stable and documented as 199 pounds.  Nutrition diagnosis: Food and nutrition related knowledge deficit improving.  Intervention: Educated patient on strategies for improving taste alterations and provided fact sheet. Clarified that Ensure Plus and ensure Enlive as well as boost are lactose-free. Patient agrees to try them so I provided samples. Questions were answered.  Teach back method used.  Monitoring, evaluation, goals: Patient will tolerate adequate calories and protein for weight maintenance during treatment.  Next visit: Wednesday, August 21 after radiation therapy.  **Disclaimer: This note was dictated with voice recognition software. Similar sounding words can inadvertently be transcribed and this note may contain transcription errors which may not have been corrected upon publication of note.**

## 2017-10-17 NOTE — Telephone Encounter (Signed)
Post ED Visit - Positive Culture Follow-up  Culture report reviewed by antimicrobial stewardship pharmacist:  []  Elenor Quinones, Pharm.D. []  Heide Guile, Pharm.D., BCPS AQ-ID []  Parks Neptune, Pharm.D., BCPS []  Alycia Rossetti, Pharm.D., BCPS []  [place], Pharm.D., BCPS, AAHIVP []  Legrand Como, Pharm.D., BCPS, AAHIVP []  Salome Arnt, PharmD, BCPS []  Johnnette Gourd, PharmD, BCPS []  Hughes Better, PharmD, BCPS []  Leeroy Cha, PharmD  Jackson Latino, PharmD  Positive urine culture Treated with Doxycycline Hyclate, organism sensitive to the same and no further patient follow-up is required at this time.  Shelley Mason 10/17/2017, 9:31 AM

## 2017-10-20 ENCOUNTER — Ambulatory Visit
Admission: RE | Admit: 2017-10-20 | Discharge: 2017-10-20 | Disposition: A | Discharge status: Home / Self Care | Payer: Managed Care, Other (non HMO) | Source: Ambulatory Visit | Attending: Radiation Oncology | Admitting: Radiation Oncology

## 2017-10-20 ENCOUNTER — Other Ambulatory Visit: Payer: Self-pay | Admitting: Radiation Oncology

## 2017-10-20 DIAGNOSIS — C07 Malignant neoplasm of parotid gland: Secondary | ICD-10-CM

## 2017-10-20 DIAGNOSIS — Z510 Encounter for antineoplastic radiation therapy: Secondary | ICD-10-CM | POA: Diagnosis not present

## 2017-10-20 MED ORDER — NEOMYCIN-POLYMYXIN-HC 1 % OT SOLN: 3.0000 [drp] | Freq: Three times a day (TID) | OTIC | 0 refills | Status: DC

## 2017-10-21 ENCOUNTER — Ambulatory Visit
Admission: RE | Admit: 2017-10-21 | Discharge: 2017-10-21 | Disposition: A | Discharge status: Home / Self Care | Payer: Managed Care, Other (non HMO) | Source: Ambulatory Visit | Attending: Radiation Oncology | Admitting: Radiation Oncology

## 2017-10-21 DIAGNOSIS — Z510 Encounter for antineoplastic radiation therapy: Secondary | ICD-10-CM | POA: Diagnosis not present

## 2017-10-22 ENCOUNTER — Ambulatory Visit
Admission: RE | Admit: 2017-10-22 | Discharge: 2017-10-22 | Disposition: A | Discharge status: Home / Self Care | Payer: Managed Care, Other (non HMO) | Source: Ambulatory Visit | Attending: Radiation Oncology | Admitting: Radiation Oncology

## 2017-10-22 ENCOUNTER — Inpatient Hospital Stay: Payer: Managed Care, Other (non HMO) | Admitting: Nutrition

## 2017-10-22 DIAGNOSIS — Z510 Encounter for antineoplastic radiation therapy: Secondary | ICD-10-CM | POA: Diagnosis not present

## 2017-10-22 NOTE — Progress Notes (Signed)
Nutrition follow-up completed with patient after radiation therapy. Patient is receiving treatment for parotid gland cancer. Weight is stable and documented as 199 pounds. Patient continues to deny nausea, vomiting, and constipation. She did report some diarrhea after drinking a nutrition shake.  Nutrition diagnosis: Food and nutrition related knowledge deficit improved.  Intervention: Patient agrees to continue boost plus for additional calories and protein. Encouraged her to drink 4 ounces at a time to improve possibility of diarrhea. Provided additional samples.  Provided coupons. Questions answered.  Teach back method used.  Monitoring, evaluation, goals: Patient will continue to work to increase calories and protein for weight maintenance.  Next visit: Wednesday, August 28 after radiation therapy.  **Disclaimer: This note was dictated with voice recognition software. Similar sounding words can inadvertently be transcribed and this note may contain transcription errors which may not have been corrected upon publication of note.**

## 2017-10-23 ENCOUNTER — Ambulatory Visit
Admission: RE | Admit: 2017-10-23 | Discharge: 2017-10-23 | Disposition: A | Discharge status: Home / Self Care | Payer: Managed Care, Other (non HMO) | Source: Ambulatory Visit | Attending: Radiation Oncology | Admitting: Radiation Oncology

## 2017-10-23 DIAGNOSIS — Z510 Encounter for antineoplastic radiation therapy: Secondary | ICD-10-CM | POA: Diagnosis not present

## 2017-10-24 ENCOUNTER — Ambulatory Visit
Admission: RE | Admit: 2017-10-24 | Discharge: 2017-10-24 | Disposition: A | Discharge status: Home / Self Care | Payer: Managed Care, Other (non HMO) | Source: Ambulatory Visit | Attending: Radiation Oncology | Admitting: Radiation Oncology

## 2017-10-24 DIAGNOSIS — Z510 Encounter for antineoplastic radiation therapy: Secondary | ICD-10-CM | POA: Diagnosis not present

## 2017-10-27 ENCOUNTER — Ambulatory Visit
Admission: RE | Admit: 2017-10-27 | Discharge: 2017-10-27 | Disposition: A | Discharge status: Home / Self Care | Payer: Managed Care, Other (non HMO) | Source: Ambulatory Visit | Attending: Radiation Oncology | Admitting: Radiation Oncology

## 2017-10-27 ENCOUNTER — Telehealth: Payer: Self-pay | Admitting: *Deleted

## 2017-10-27 DIAGNOSIS — Z510 Encounter for antineoplastic radiation therapy: Secondary | ICD-10-CM | POA: Diagnosis not present

## 2017-10-27 DIAGNOSIS — C07 Malignant neoplasm of parotid gland: Secondary | ICD-10-CM

## 2017-10-27 MED ORDER — SONAFINE EX EMUL
1.0000 "application " | Freq: Once | CUTANEOUS | Status: AC
  Administered 2017-10-27: 1 via TOPICAL

## 2017-10-27 NOTE — Telephone Encounter (Signed)
Oncology Nurse Navigator Documentation  In follow-up to Dr. Pearlie Oyster guidance per patient's just completed Weekly Under Treatment appt, spoke with ENT Dr. Janeice Robinson MA [facility] Mackie, requested patient be seen ASAP to address L ear pain, inflamed moist L ear tissue.  She acknowledged, noted she will contact patient.  [name], RN, BSN Head & Neck Oncology Nurse [facility] [phone]

## 2017-10-28 ENCOUNTER — Ambulatory Visit
Admission: RE | Admit: 2017-10-28 | Discharge: 2017-10-28 | Disposition: A | Discharge status: Home / Self Care | Payer: Managed Care, Other (non HMO) | Source: Ambulatory Visit | Attending: Radiation Oncology | Admitting: Radiation Oncology

## 2017-10-28 DIAGNOSIS — H9042 Sensorineural hearing loss, unilateral, left ear, with unrestricted hearing on the contralateral side: Secondary | ICD-10-CM | POA: Insufficient documentation

## 2017-10-28 DIAGNOSIS — Z510 Encounter for antineoplastic radiation therapy: Secondary | ICD-10-CM | POA: Diagnosis not present

## 2017-10-29 ENCOUNTER — Inpatient Hospital Stay: Payer: Managed Care, Other (non HMO) | Admitting: Nutrition

## 2017-10-29 ENCOUNTER — Ambulatory Visit
Admission: RE | Admit: 2017-10-29 | Discharge: 2017-10-29 | Disposition: A | Discharge status: Home / Self Care | Payer: Managed Care, Other (non HMO) | Source: Ambulatory Visit | Attending: Radiation Oncology | Admitting: Radiation Oncology

## 2017-10-29 DIAGNOSIS — Z510 Encounter for antineoplastic radiation therapy: Secondary | ICD-10-CM | POA: Diagnosis not present

## 2017-10-29 NOTE — Progress Notes (Signed)
Nutrition follow-up completed with patient after radiation therapy for parotid gland cancer. Weight decreased and documented as 192.8 pounds down from 199 pounds. Patient reports she is eating when she can but continues to have no taste. She is still drinking two oral nutrition supplements daily, 4 ounces at a time.  She has had less diarrhea from this. She has tried to eat some additional high-protein foods. Reports she feels great and has been walking on a daily basis.  Nutrition diagnosis: Food and nutrition related knowledge deficit improved.  Intervention: Patient agrees to continue oral nutrition supplements and try to increase 3 times daily. Provided samples.   Reviewed soft moist high-protein foods and stressed importance of patient increasing overall calories. Fact sheets were given. Questions were answered teach back method used  Monitoring, evaluation, goals: Patient will work to increase calories and protein to minimize weight loss.  Next visit: Wednesday, September 4.  **Disclaimer: This note was dictated with voice recognition software. Similar sounding words can inadvertently be transcribed and this note may contain transcription errors which may not have been corrected upon publication of note.**

## 2017-10-30 ENCOUNTER — Ambulatory Visit
Admission: RE | Admit: 2017-10-30 | Discharge: 2017-10-30 | Disposition: A | Discharge status: Home / Self Care | Payer: Managed Care, Other (non HMO) | Source: Ambulatory Visit | Attending: Radiation Oncology | Admitting: Radiation Oncology

## 2017-10-30 DIAGNOSIS — Z510 Encounter for antineoplastic radiation therapy: Secondary | ICD-10-CM | POA: Diagnosis not present

## 2017-10-31 ENCOUNTER — Ambulatory Visit
Admission: RE | Admit: 2017-10-31 | Discharge: 2017-10-31 | Disposition: A | Discharge status: Home / Self Care | Payer: Managed Care, Other (non HMO) | Source: Ambulatory Visit | Attending: Radiation Oncology | Admitting: Radiation Oncology

## 2017-10-31 DIAGNOSIS — Z510 Encounter for antineoplastic radiation therapy: Secondary | ICD-10-CM | POA: Diagnosis not present

## 2017-11-04 ENCOUNTER — Ambulatory Visit
Admission: RE | Admit: 2017-11-04 | Discharge: 2017-11-04 | Disposition: A | Discharge status: Home / Self Care | Payer: Managed Care, Other (non HMO) | Source: Ambulatory Visit | Attending: Radiation Oncology | Admitting: Radiation Oncology

## 2017-11-04 ENCOUNTER — Other Ambulatory Visit: Payer: Self-pay | Admitting: Radiation Oncology

## 2017-11-04 DIAGNOSIS — Z510 Encounter for antineoplastic radiation therapy: Secondary | ICD-10-CM | POA: Diagnosis not present

## 2017-11-04 DIAGNOSIS — C07 Malignant neoplasm of parotid gland: Secondary | ICD-10-CM | POA: Diagnosis not present

## 2017-11-04 MED ORDER — FLUCONAZOLE 100 MG PO TABS: ORAL_TABLET | ORAL | 0 refills | Status: DC

## 2017-11-04 MED ORDER — HYDROCODONE-ACETAMINOPHEN 7.5-325 MG/15ML PO SOLN: 10.0000 mL | Freq: Four times a day (QID) | ORAL | 0 refills | Status: DC | PRN

## 2017-11-05 ENCOUNTER — Inpatient Hospital Stay: Payer: Managed Care, Other (non HMO) | Attending: Radiation Oncology | Admitting: Nutrition

## 2017-11-05 ENCOUNTER — Ambulatory Visit: Payer: Managed Care, Other (non HMO)

## 2017-11-05 ENCOUNTER — Ambulatory Visit
Admission: RE | Admit: 2017-11-05 | Discharge: 2017-11-05 | Disposition: A | Discharge status: Home / Self Care | Payer: Managed Care, Other (non HMO) | Source: Ambulatory Visit | Attending: Radiation Oncology | Admitting: Radiation Oncology

## 2017-11-05 DIAGNOSIS — C07 Malignant neoplasm of parotid gland: Secondary | ICD-10-CM | POA: Diagnosis not present

## 2017-11-05 NOTE — Progress Notes (Signed)
Nutrition follow-up completed with patient after radiation therapy for parotid gland cancer. Weight is stable from last week at 192.2 pounds. Patient continues to try to eat when she can but continues to have altered taste. She continues to try to drink to oral supplements a day.  Nutrition diagnosis: Food and nutrition related knowledge deficit improved.  Intervention: Educated patient on importance of increased nutrition for at least 2 to 4 weeks after radiation therapy. Explained the importance of nutrition with regards to healing. Recommended patient continue oral nutrition supplements. Teach back method used.  Monitoring, evaluation, goals: Patient will tolerate adequate calories and protein to promote healing after radiation therapy.  No follow-up is scheduled.  Patient's last radiation therapy treatment is tomorrow.  She has good knowledge of nutrition information.  **Disclaimer: This note was dictated with voice recognition software. Similar sounding words can inadvertently be transcribed and this note may contain transcription errors which may not have been corrected upon publication of note.**

## 2017-11-06 ENCOUNTER — Ambulatory Visit
Admission: RE | Admit: 2017-11-06 | Discharge: 2017-11-06 | Disposition: A | Discharge status: Home / Self Care | Payer: Managed Care, Other (non HMO) | Source: Ambulatory Visit | Attending: Radiation Oncology | Admitting: Radiation Oncology

## 2017-11-06 ENCOUNTER — Encounter: Payer: Self-pay | Admitting: Radiation Oncology

## 2017-11-06 ENCOUNTER — Encounter: Payer: Self-pay | Admitting: *Deleted

## 2017-11-06 DIAGNOSIS — C07 Malignant neoplasm of parotid gland: Secondary | ICD-10-CM | POA: Diagnosis not present

## 2017-11-09 NOTE — Progress Notes (Signed)
Oncology Nurse Navigator Documentation  Met with Ms. Albornoz during final RT to offer support and to celebrate end of radiation treatment.  She was accompanied by multiple family members. Provided verbal/written post-RT guidance:  Importance of keeping all follow-up appts, especially those with Nutrition and SLP.  Importance of protecting treatment area from sun.  Continuation of Sonafine application 2-3 times daily until supply exhausted after which transition to OTC lotion with vitamin E. Explained my role as navigator will continue for several more months and that I will be calling and/or joining her during follow-up visits.   I encouraged her to call me with needs/concerns.   She verbalized understanding of information provided.  [name], RN, BSN Head & Neck Oncology [place] [place] [phone]

## 2017-11-12 ENCOUNTER — Encounter: Payer: Self-pay | Admitting: Radiation Oncology

## 2017-11-12 NOTE — Progress Notes (Signed)
Shelley Mason presents for follow up of radiation completed 11/06/17 to her neck neck.    Pain issues, if any: Yes, she reports pain to her incision site. It is more severe when turning her head a certain way.  Using a feeding tube?: No Weight changes, if any:  Wt Readings from Last 3 Encounters:  11/21/17 189 lb 4 oz (85.8 kg)  11/05/17 192 lb 3.2 oz (87.2 kg)  10/29/17 192 lb 12.8 oz (87.5 kg)   Swallowing issues, if any: She reports a continued sore throat, thick saliva.  Smoking or chewing tobacco? She is smoking 10-15 cigarettes daily. This is down from 30 cigarettes daily.  Using fluoride trays daily? No Last ENT visit was on: Dr. Constance Holster 10/28/17 next scheduled on 12/29/17 Other notable issues, if any:   BP (!) 141/92 (BP Location: Right Arm, Patient Position: Sitting)   Pulse 85   Temp 98.4 F (36.9 C) (Oral)   Resp 18   Ht 5' 4.5" (1.638 m)   Wt 189 lb 4 oz (85.8 kg)   SpO2 100%   BMI 31.98 kg/m

## 2017-11-14 NOTE — Progress Notes (Signed)
  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 Cancer Staging Malignant neoplasm of parotid gland [facility]) Staging form: Major Salivary Glands, AJCC 8th Edition - Clinical: No stage assigned - Unsigned - Pathologic: Stage II (pT2, pN0, cM0) - Signed by Eppie Gibson, MD on 09/17/2017   Indication for treatment:  Post op, curative   Radiation treatment dates:   09/22/2017 to 11/06/2017  Site/dose:   The Left parotid bed and distal facial nerve pathway  treated to 64 Gy in 32 fractions of 2 Gy.  Beams/energy:   IMRT // 6X  Narrative: The patient tolerated radiation treatment with some difficulty.   On exam, oral thrush noted, as well as hyperpigmented skin in the radiation treatment fields. Denies fatigue. Reports mild vertigo. Reports moderate painful and difficulty swallowing and is using lidocaine.  Patient sees a dietician weekly but does not drink Boost or Ensure because they give her diarrhea. Reports dry mouth and is using Biotene. Reports thick saliva. Reports taste changes and has no taste. States she is not eating as much due to taste changes and "feeling like her mouth is swelling".   Patient also reports shooting pain in left ear that comes and goes. Further stating she has a middle ear infection and was given ear drops by Dr. Constance Holster, but the drops have not helped the pain. Patient states per Dr. Constance Holster some hearing was lost in the left ear. Patient states Dr. Constance Holster will see her back after she finishes radiation.   Plan: The patient has completed radiation treatment. Re-discussed potential temporary and permanent hearing loss due to radiation treatment which is directed at distal facial nerve pathway as  well as tumor bed. Refilled Hycet. Prescribed fluconazole for thrush. Patient not eating well; advised patient to try Breeze drinks and to try maintaining weight around 190 lbs. Advised patient to stop smoking as this can worsen side effects. The patient will return to radiation oncology clinic for routine followup in one half month. I advised them to call or return sooner if they have any questions or concerns related to their recovery or treatment.  -----------------------------------  Eppie Gibson, MD   This document serves as a record of services personally performed by Eppie Gibson, MD. It was created on her behalf by Arlyce Harman, a trained medical scribe. The creation of this record is based on the scribe's personal observations and the provider's statements to them. This document has been checked and approved by the attending provider.

## 2017-11-17 ENCOUNTER — Other Ambulatory Visit: Payer: Self-pay | Admitting: Radiation Oncology

## 2017-11-17 DIAGNOSIS — C07 Malignant neoplasm of parotid gland: Secondary | ICD-10-CM

## 2017-11-17 MED ORDER — HYDROCODONE-ACETAMINOPHEN 7.5-325 MG/15ML PO SOLN: 10.0000 mL | Freq: Four times a day (QID) | ORAL | 0 refills | Status: DC | PRN

## 2017-11-21 ENCOUNTER — Encounter: Payer: Self-pay | Admitting: Radiation Oncology

## 2017-11-21 ENCOUNTER — Other Ambulatory Visit: Payer: Self-pay

## 2017-11-21 ENCOUNTER — Ambulatory Visit
Admission: RE | Admit: 2017-11-21 | Discharge: 2017-11-21 | Disposition: A | Discharge status: Home / Self Care | Payer: Managed Care, Other (non HMO) | Source: Ambulatory Visit | Attending: Radiation Oncology | Admitting: Radiation Oncology

## 2017-11-21 VITALS — BP 141/92 | HR 85 | Temp 98.4°F | Resp 18 | Ht 64.5 in | Wt 189.2 lb

## 2017-11-21 DIAGNOSIS — Z79899 Other long term (current) drug therapy: Secondary | ICD-10-CM | POA: Diagnosis not present

## 2017-11-21 DIAGNOSIS — Z885 Allergy status to narcotic agent status: Secondary | ICD-10-CM | POA: Diagnosis not present

## 2017-11-21 DIAGNOSIS — C07 Malignant neoplasm of parotid gland: Secondary | ICD-10-CM | POA: Diagnosis not present

## 2017-11-21 DIAGNOSIS — F17210 Nicotine dependence, cigarettes, uncomplicated: Secondary | ICD-10-CM | POA: Insufficient documentation

## 2017-11-21 DIAGNOSIS — Z510 Encounter for antineoplastic radiation therapy: Secondary | ICD-10-CM | POA: Insufficient documentation

## 2017-11-21 DIAGNOSIS — Z880 Allergy status to penicillin: Secondary | ICD-10-CM | POA: Diagnosis not present

## 2017-11-21 HISTORY — DX: Personal history of irradiation: Z92.3

## 2017-11-21 MED ORDER — FLUCONAZOLE 100 MG PO TABS: ORAL_TABLET | ORAL | 0 refills | Status: DC

## 2017-11-21 NOTE — Progress Notes (Signed)
Radiation Oncology         [phone] ________________________________  Name: Shelley Mason MRN: [id number]  Date: 11/21/2017  DOB: 15-Mar-1965  Follow-Up Visit Note  CC: Martyn Malay, MD  Izora Gala, MD  Diagnosis and Prior Radiotherapy:       ICD-10-CM   1. Malignant neoplasm of parotid gland ([place]) C07 fluconazole (DIFLUCAN) 100 MG tablet     Cancer Staging Malignant neoplasm of parotid gland [facility]) Staging form: Major Salivary Glands, AJCC 8th Edition - Clinical: No stage assigned - Unsigned - Pathologic: Stage II (pT2, pN0, cM0) - Signed by Eppie Gibson, MD on 09/17/2017  CHIEF COMPLAINT:  Here for follow-up and surveillance of left parotid cancer  Radiation treatment dates:   09/22/2017 to 11/06/2017  Site/dose:   The Left parotid was treated to 64 Gy in 32 fractions of 2 Gy.  Narrative:  The patient returns today for routine follow-up following completion of radiation therapy to left parotid on 11/06/2017.  She is accompanied by a family member today. She was advised by her physical therapist to complete mild neck exercises. She reports pulling sensation to her left sided neck, left neck pain, left ear pain, sensitivity to the touch of her left sided neck, nausea due to skin sensitivity, and vertigo-like symptoms. Her ENT is aware of her vertigo symptoms. She is still using radiaplex at this time. She denies any other symptoms. She also voices concern for when she can go back to work, however, she wants to make sure that she is fully healed first and not having vertigo. She also walks daily as her form of exercise.   Pain issues, if any: Yes, pain to her left ear, left sided neck.  Using a feeding tube?: No  Swallowing issues, if any: She reports a continued sore throat, thick saliva. Smoking or chewing tobacco? She is still smoking 10-15 cigarettes daily, however, this is down from 30 cigarettes daily.  Using fluoride trays daily? No Last ENT visit was on: Dr. Constance Holster  10/28/17 next scheduled on 12/29/17      ALLERGIES:  is allergic to penicillins and codeine.  Meds: Current Outpatient Medications  Medication Sig Dispense Refill  . citalopram (CELEXA) 20 MG tablet Take 1 tablet (20 mg total) by mouth daily. 30 tablet 6  . HYDROcodone-acetaminophen (HYCET) 7.5-325 mg/15 ml solution Take 10 mLs by mouth 4 (four) times daily as needed for moderate pain. 473 mL 0  . lidocaine (XYLOCAINE) 2 % solution Use as directed 15 mLs in the mouth or throat as needed for mouth pain (prior to meals). Swish and swallow 150 mL 1  . meclizine (ANTIVERT) 25 MG tablet Take 1 tablet (25 mg total) by mouth 3 (three) times daily as needed for dizziness. 30 tablet 0  . NEOMYCIN-POLYMYXIN-HYDROCORTISONE (CORTISPORIN) 1 % SOLN OTIC solution Place 3 drops into the left ear 3 (three) times daily. 10 mL 0  . buPROPion (WELLBUTRIN SR) 150 MG 12 hr tablet Start one week before quit date. Take 1 tab daily x 3 days, then 1 tab BID thereafter. (Patient not taking: Reported on 11/21/2017) 60 tablet 2  . fluconazole (DIFLUCAN) 100 MG tablet Take 2 tablets today, then 1 tablet daily x 20 more days. 22 tablet 0  . LORazepam (ATIVAN) 0.5 MG tablet Take 1 tab prior to radiotherapy PRN anxiety. (Patient not taking: Reported on 11/21/2017) 20 tablet 0  . nicotine (NICODERM CQ - DOSED IN MG/24 HOURS) 14 mg/24hr patch Apply 21 mg patch daily  x 6 wk, then 14mg  patch daily x 2 wk, then 7 mg patch daily x 2 wk (Patient not taking: Reported on 09/18/2017) 14 patch 0  . nicotine (NICODERM CQ - DOSED IN MG/24 HOURS) 21 mg/24hr patch Apply 21 mg patch daily x 6 wk, then 14mg  patch daily x 2 wk, then 7 mg patch daily x 2 wk (Patient not taking: Reported on 09/18/2017) 14 patch 2  . nicotine (NICODERM CQ - DOSED IN MG/24 HR) 7 mg/24hr patch Apply 21 mg patch daily x 6 wk, then 14mg  patch daily x 2 wk, then 7 mg patch daily x 2 wk (Patient not taking: Reported on 09/18/2017) 14 patch 0   No current  facility-administered medications for this encounter.     Physical Findings: The patient is in no acute distress. Patient is alert and oriented. Wt Readings from Last 3 Encounters:  11/21/17 189 lb 4 oz (85.8 kg)  11/05/17 192 lb 3.2 oz (87.2 kg)  10/29/17 192 lb 12.8 oz (87.5 kg)    height is 5' 4.5" (1.638 m) and weight is 189 lb 4 oz (85.8 kg). Her oral temperature is 98.4 F (36.9 C). Her blood pressure is 141/92 (abnormal) and her pulse is 85. Her respiration is 18 and oxygen saturation is 100%. .  General: Alert and oriented, in no acute distress HEENT: Head is normocephalic. Extraocular movements are intact. Oropharynx is notable for white coating on her tongue with black pigmented spots. Whitish exudate in the left ear canal.  Skin: Skin in treatment fields shows satisfactory healing with hyperpigmentation over left ear, left neck, left face. Skin is intact  Lymphatics: see Neck Exam Psychiatric: Judgment and insight are intact. Affect is appropriate.   Lab Findings: Lab Results  Component Value Date   WBC 7.3 10/13/2017   HGB 12.7 10/13/2017   HCT 38.2 10/13/2017   MCV 96.2 10/13/2017   PLT 292 10/13/2017   CMP     Component Value Date/Time   NA 140 10/13/2017 2308   NA 139 04/18/2017 1640   K 4.5 10/13/2017 2308   CL 110 10/13/2017 2308   CO2 23 10/13/2017 2308   GLUCOSE 96 10/13/2017 2308   BUN 13 10/13/2017 2308   BUN 19 04/18/2017 1640   CREATININE 0.71 10/13/2017 2308   CALCIUM 8.9 10/13/2017 2308   PROT 6.9 10/13/2017 2308   ALBUMIN 3.7 10/13/2017 2308   AST 23 10/13/2017 2308   ALT 13 10/13/2017 2308   ALKPHOS 52 10/13/2017 2308   BILITOT 1.0 10/13/2017 2308   GFRNONAA >60 10/13/2017 2308   GFRAA >60 10/13/2017 2308    Lab Results  Component Value Date   TSH 1.210 04/18/2017    Radiographic Findings: No results found.  Impression/Plan:    1) Head and Neck Cancer Status: Healing well from radiation therapy  2) Nutritional Status: stable    PEG tube: No  3) Risk Factors: The patient has been educated about risk factors including alcohol and tobacco abuse; they understand that avoidance of alcohol and tobacco is important to prevent recurrences as well as other cancers. Urged to keep cutting down on smoking as this inhibit her healing and increase risk of other malignancies  4) Swallowing: functional, she reports that she had improved swallowing, however, swallowing is now less comfortable now due to white coating on her tongue that was better previously  with fluconazole. We will re-prescribe fluconazole for 3 wks.   5) Dental: Will refer the patient back to Dr. Albin Felling  for further evaluation.   6) Thyroid function: WNL Lab Results  Component Value Date   TSH 1.210 04/18/2017    7) Other: She reports that the white coating on her tongue got better with fluconazole, however, once completed, it returned. We will give her another 3 week course of fluconazole to address this.   -Advised the patient to remain active within limits and to ensure that she is not completely sedentary.   8) Follow-up in 3 months with a CT soft tissue neck prior to the visit. Maintain follow up with Dr. Constance Holster on 12/29/2017.  The patient was encouraged to call with any issues or questions before then. _____________________________________   Eppie Gibson, MD  This document serves as a record of services personally performed by Eppie Gibson, MD. It was created on her behalf by Steva Colder, a trained medical scribe. The creation of this record is based on the scribe's personal observations and the provider's statements to them. This document has been checked and approved by the attending provider.

## 2017-11-24 ENCOUNTER — Encounter: Payer: Self-pay | Admitting: Radiation Oncology

## 2017-11-24 ENCOUNTER — Other Ambulatory Visit: Payer: Self-pay | Admitting: Radiation Oncology

## 2017-11-24 DIAGNOSIS — C07 Malignant neoplasm of parotid gland: Secondary | ICD-10-CM

## 2017-11-28 ENCOUNTER — Other Ambulatory Visit: Payer: Self-pay | Admitting: Radiation Oncology

## 2017-11-28 ENCOUNTER — Ambulatory Visit
Admission: RE | Admit: 2017-11-28 | Discharge: 2017-11-28 | Disposition: A | Discharge status: Home / Self Care | Payer: Managed Care, Other (non HMO) | Source: Ambulatory Visit | Attending: Family Medicine | Admitting: Family Medicine

## 2017-11-28 ENCOUNTER — Other Ambulatory Visit: Payer: Self-pay | Admitting: Family Medicine

## 2017-11-28 DIAGNOSIS — C07 Malignant neoplasm of parotid gland: Secondary | ICD-10-CM

## 2017-11-28 DIAGNOSIS — R921 Mammographic calcification found on diagnostic imaging of breast: Secondary | ICD-10-CM

## 2017-11-28 MED ORDER — HYDROCODONE-ACETAMINOPHEN 7.5-325 MG/15ML PO SOLN: 10.0000 mL | Freq: Four times a day (QID) | ORAL | 0 refills | Status: DC | PRN

## 2017-11-28 NOTE — Progress Notes (Signed)
Returned patient's call - no answer - left VM letting her know refill for Hycet will be made to [facility].  Advised her to talk to her PCP about insomnia and hot flashes which may be related to perimenopause. Call me back if any questions.    -----------------------------------  Shelley Gibson, MD

## 2017-12-01 ENCOUNTER — Encounter: Payer: Self-pay | Admitting: Family Medicine

## 2017-12-16 ENCOUNTER — Other Ambulatory Visit: Payer: Self-pay | Admitting: Radiation Oncology

## 2017-12-16 DIAGNOSIS — C07 Malignant neoplasm of parotid gland: Secondary | ICD-10-CM

## 2017-12-16 MED ORDER — HYDROCODONE-ACETAMINOPHEN 7.5-325 MG/15ML PO SOLN: 10.0000 mL | Freq: Four times a day (QID) | ORAL | 0 refills | Status: DC | PRN

## 2017-12-21 ENCOUNTER — Other Ambulatory Visit: Payer: Self-pay | Admitting: Radiation Oncology

## 2017-12-21 DIAGNOSIS — C07 Malignant neoplasm of parotid gland: Secondary | ICD-10-CM

## 2017-12-23 ENCOUNTER — Encounter (HOSPITAL_COMMUNITY): Payer: Self-pay | Admitting: Dentistry

## 2017-12-23 ENCOUNTER — Ambulatory Visit (HOSPITAL_COMMUNITY): Payer: Medicaid - Dental | Admitting: Dentistry

## 2017-12-23 VITALS — BP 119/84 | HR 96 | Temp 98.4°F | Wt 187.0 lb

## 2017-12-23 DIAGNOSIS — Z923 Personal history of irradiation: Secondary | ICD-10-CM

## 2017-12-23 DIAGNOSIS — K036 Deposits [accretions] on teeth: Secondary | ICD-10-CM

## 2017-12-23 DIAGNOSIS — K0530 Chronic periodontitis, unspecified: Secondary | ICD-10-CM

## 2017-12-23 DIAGNOSIS — K029 Dental caries, unspecified: Secondary | ICD-10-CM

## 2017-12-23 DIAGNOSIS — R432 Parageusia: Secondary | ICD-10-CM

## 2017-12-23 DIAGNOSIS — R682 Dry mouth, unspecified: Secondary | ICD-10-CM

## 2017-12-23 DIAGNOSIS — R1310 Dysphagia, unspecified: Secondary | ICD-10-CM

## 2017-12-23 DIAGNOSIS — K045 Chronic apical periodontitis: Secondary | ICD-10-CM

## 2017-12-23 DIAGNOSIS — K08409 Partial loss of teeth, unspecified cause, unspecified class: Secondary | ICD-10-CM

## 2017-12-23 DIAGNOSIS — C07 Malignant neoplasm of parotid gland: Secondary | ICD-10-CM

## 2017-12-23 DIAGNOSIS — K117 Disturbances of salivary secretion: Secondary | ICD-10-CM

## 2017-12-23 MED ORDER — SODIUM FLUORIDE 1.1 % DT CREA: TOPICAL_CREAM | DENTAL | 99 refills | Status: DC

## 2017-12-23 NOTE — Progress Notes (Signed)
12/23/2017  Patient Name:   Shelley Mason Date of Birth:   Mar 03, 1966 Medical Record Number: [id number]  BP 119/84 (BP Location: Right Arm)   Pulse 96   Temp 98.4 F (36.9 C)   Wt 187 lb (84.8 kg)   BMI 31.60 kg/m   Shelley Mason presents for oral examination after radiation therapy. Patient has completed radiation treatments from 09/22/2017 through 11/06/2017. No chemotherapy.  REVIEW OF CHIEF COMPLAINTS: DRY MOUTH: yes. HARD TO SWALLOW: yes.  HURT TO SWALLOW: yes, at times TASTE CHANGES: taste is slowly returning SORES IN MOUTH: none TRISMUS: no problems with trismus symptoms WEIGHT: 187 pounds down from initial 215 pounds. DENTAL PAIN: Patient is complaining of tooth pain from #30. Patient also is complaining of left ear pain and is being evaluated by Dr. Constance Holster in the near future.  HOME OH REGIMEN:  BRUSHING: 3 times a day with baking soda. Patient had sensitivity with regular toothpaste. Patient was encouraged to start using [employer] toothpaste for sensitive teeth. Patient will also start using PreviDent 5000 fluoride toothpaste at bedtime as per prescription. FLOSSING: 3 times a day RINSING: Using Biotene rinses as needed FLUORIDE: Patient is not using prescription for fluoride therapy. A prescription for PreviDent 5000+ toothpaste will be prescribed for the patient today with refills for one year. TRISMUS EXERCISES:  Maximum interincisal opening: 45 mm   DENTAL EXAM:  Oral Hygiene:(PLAQUE): good oral hygiene. LOCATION OF MUCOSITIS: none noted DESCRIPTION OF SALIVA: decreased saliva with incipient xerostomia ANY EXPOSED BONE: none noted OTHER WATCHED AREAS: tooth numbers 2 and 30. DIAGNOSES: xerostomia, dysphagia, dysgeusia, odynophagia, chronic apical periodontitis, acute pulpitis, chronic periodontitis,dental caries, supraeruption of tooth #2.  RECOMMENDATIONS: 1. Brush after meals and at bedtime with [employer] toothpaste for Sensitive Teeth.  Use PreviDent 5000  prescription fluoride at bedtime. Prescription was sent to [facility] pharmacy with refills for one year. 2. Use trismus exercises as directed. 3. Use Biotene Rinse or salt water/baking soda rinses. 4. Multiple sips of water as needed. 5. Patient is to be referred to Dr. Diona Browner for evaluation for extraction of tooth numbers 2 and 30. Patient is to then follow-up with general dentist of her choice for exam, periodontal therapy, and evaluation for other dental treatment as indicated. The patient is to check the list the dentists provided by her dental insurance provider or patient is to consider referral to Dr. Lorenda Cahill. Release of information will be obtained from the patient as needed.  Lenn Cal, DDS

## 2017-12-23 NOTE — Patient Instructions (Addendum)
RECOMMENDATIONS: 1. Brush after meals and at bedtime with [employer] toothpaste for Sensitive Teeth.  Use PreviDent 5000 prescription fluoride at bedtime.prescription was sent to [facility] with refills for one year. 2. Use trismus exercises as directed. 3. Use Biotene Rinse or salt water/baking soda rinses. 4. Multiple sips of water as needed. 5. Patient is to be referred to Dr. Diona Browner for evaluation for extraction of tooth numbers 2 and 30. Patient is to then follow-up with general dentist of her choice for exam, periodontal therapy, and evaluation for other dental treatment as indicated. The patient is to check the list the dentists provided by her dental insurance provider or patient is to consider referral to Dr. Lorenda Cahill. Release of inforamtion will be obtained from the patient as needed.  Lenn Cal, DDS      RADIATION THERAPY AND DECISIONS REGARDING YOUR TEETH  Xerostomia (dry mouth) Your salivary glands may be in the filed of radiation.  Radiation may include all or part of your saliva glands.  This will cause your saliva to dry up and you will have a dry mouth.  The dry mouth will be for the rest of your life unless your radiation oncologist tells you otherwise.  Your saliva has many functions:  Saliva wets your tongue for speaking.  It coats your teeth and the inside of your mouth for easier movement.  It helps with chewing and swallowing food.  It helps clean away harmful acid and toxic products made by the germs in your mouth, therefore it helps prevent cavities.  It kills some germs in your mouth and helps to prevent gum disease.  It helps to carry flavor to your taste buds.  Once you have lost your saliva you will be at higher risk for tooth decay and gum disease.  What can be done to help improve your mouth when there's not enough saliva:  1.  Your dentist may give a prescription for Salagen.  It will not bring back all of your saliva but may bring  back some of it.  Also your saliva may be thick and ropy or white and foamy. It will not feel like it use to feel.  2.  You will need to swish with water every time your mouth feels dry.  YOU CANNOT suck on any cough drops, mints, lemon drops, candy, vitamin C or any other products.  You cannot use anything other than water to make your mouth feel less dry.  If you want to drink anything else you have to drink it all at once and brush afterwards.  Be sure to discuss the details of your diet habits with your dentist or hygienist.  Radiation caries: This is decay that happens very quickly once your mouth is very dry due to radiation therapy.  Normally cavities take six months to two years to become a problem.  When you have dry mouth cavities may take as little as eight weeks to cause you a problem.  This is why dental check ups every two months are necessary as long as you have a dry mouth. Radiation caries typically, but not always, start at your gum line where it is hard to see the cavity.  It is therefore also hard to fill these cavities adequately.  This high rate of cavities happens because your mouth no longer has saliva and therefore the acid made by the germs starts the decay process.  Whenever you eat anything the germs in your mouth change the  food into acid.  The acid then burns a small hole in your tooth.  This small hole is the beginning of a cavity.  If this is not treated then it will grow bigger and become a cavity.  The way to avoid this hole getting bigger is to use fluoride every evening as prescribed by your dentist.  You have to make sure that your teeth are very clean before you use the fluoride.  This fluoride in turn will strengthen your teeth and prepare them for another day of fighting acid.  If you develop radiation caries many times the damage is so large that you will have to have all your teeth removed.  This could be a big problem if some of these teeth are in the field of  radiation.  Further details of why this could be a big problem will follow.  (See Osteoradionecrosis).  Loss of taste (dysgeusia) This happens to varying degrees once you've had radiation therapy to your jaw region.  Many times taste is not completely lost but becomes limited.  The loss of taste is mostly due to radiation affecting your taste buds.  However if you have no saliva in your mouth to carry the flavor to your taste buds it would be difficult for your taste buds to taste anything.  That is why using water or a prescription for Salagen prior to meals and during meals may help with some of the taste.  Keep in mind that taste generally returns very slowly over the course of several months or several years after radiation therapy.  Don't give up hope.  Trismus According to your Radiation Oncologist your TMJ or jaw joints are going to be partially or fully in the field of radiation.  This means that over time the muscles that help you open and close your mouth may get stiff.  This will potentially result in your not being able to open your mouth wide enough or as wide as you can open it now.  Le me give you an example of how slowly this happens and how unaware people are of it.  A gentlemen that had radiation therapy two years ago came back to me complaining that bananas are just too large for him to be able to fit them in between his teeth.  He was not able to open wide enough to bite into a banana.  This happens slowly and over a period of time.  What do we do to try and prevent this?  Your dentist will probably give you a stack of sticks called a trismus exercise device .  This stack will help your remind your muscles and your jaw joint to open up to the same distance every day.  Use these sticks every morning when you wake up according to the instructions given by the dentist.   You must use these sticks for at least one to two years after radiation therapy.  The reason for that is because it  happens so slowly and keeps going on for about two years after radiation therapy.  Your hospital dentist will help you monitor your mouth opening and make sure that it's not getting smaller.  Osteoradionecrosis (ORN) This is a condition where your jaw bone after having had radiation therapy becomes very dry.  It has very little blood supply to keep it alive.  If you develop a cavity that turns into an abscess or an infection then the jaw bone does not have enough blood supply  to help fight the infection.  At this point it is very likely that the infection could cause the death of your jaw bone.  When you have dead bone it has to be removed.  Therefore you might end up having to have surgery to remove part of your jaw bone, the part of the jaw bone that has been affected.   Healing is also a problem if you are to have surgery in the areas where the bone has had radiation therapy.  The same reasons apply.  If you have surgery you need more blood supply which is not available.  When blood supply and oxygen are not available again, there is a chance for the bone to die.  Occasionally ORN happens on its own with no obvious reason.  This is quite rare.  We believe that patients who continue to smoke and/or drink alcohol have a higher chance of having this bone problem.  Therefore once your jaw bone has had radiation therapy if there are any teeth in that area, you should never have them pulled.  You should also never have any surgery on your teeth or gums in that area unless the oral surgeon or Periodontist is aware of your history of radiation. There is some expensive management techniques that might be used to limit your risks.  The risks for ORN either from infection or spontaneous ( or on it's own) are life long.    TRISMUS  Trismus is a condition where the jaw does not allow the mouth to open as wide as it usually does.  This can happen almost suddenly, or in other cases the process is so slow, it is hard  to notice it-until it is too far along.  When the jaw joints and/or muscles have been exposed to radiation treatments, the onset of Trismus is very slow.  This is because the muscles are losing their stretching ability over a long period of time, as long as 2 YEARS after the end of radiation.  It is therefore important to exercise these muscles and joints.  TRISMUS EXERCISES   Stack of tongue depressors measuring the same or a little less than the last documented MIO (Maximum Interincisal Opening).  Secure them with a rubber band on both ends.  Place the stack in the patient's mouth, supporting the other end.  Allow 30 seconds for muscle stretching.  Rest for a few seconds.  Repeat 3-5 times  For all radiation patients, this exercise is recommended in the mornings and evenings unless otherwise instructed.  The exercise should be done for a period of 2 YEARS after the end of radiation.  MIO should be checked routinely on recall dental visits by the general dentist or the hospital dentist.  The patient is advised to report any changes, soreness, or difficulties encountered when doing the exercises.

## 2018-01-01 NOTE — Progress Notes (Signed)
FMLA successfully faxed to [employer] at [phone]. Mailed copy to patient address on file.

## 2018-01-04 ENCOUNTER — Encounter: Payer: Self-pay | Admitting: Family Medicine

## 2018-01-05 ENCOUNTER — Other Ambulatory Visit: Payer: Self-pay

## 2018-01-05 ENCOUNTER — Ambulatory Visit (INDEPENDENT_AMBULATORY_CARE_PROVIDER_SITE_OTHER): Payer: Self-pay | Admitting: Family Medicine

## 2018-01-05 ENCOUNTER — Encounter: Payer: Self-pay | Admitting: Family Medicine

## 2018-01-05 VITALS — BP 122/72 | HR 97 | Temp 98.4°F | Ht 64.5 in | Wt 186.0 lb

## 2018-01-05 DIAGNOSIS — F411 Generalized anxiety disorder: Secondary | ICD-10-CM

## 2018-01-05 DIAGNOSIS — C07 Malignant neoplasm of parotid gland: Secondary | ICD-10-CM

## 2018-01-05 DIAGNOSIS — F329 Major depressive disorder, single episode, unspecified: Secondary | ICD-10-CM

## 2018-01-05 DIAGNOSIS — F32A Depression, unspecified: Secondary | ICD-10-CM

## 2018-01-05 DIAGNOSIS — Z23 Encounter for immunization: Secondary | ICD-10-CM

## 2018-01-05 DIAGNOSIS — G4700 Insomnia, unspecified: Secondary | ICD-10-CM

## 2018-01-05 DIAGNOSIS — R6889 Other general symptoms and signs: Secondary | ICD-10-CM

## 2018-01-05 DIAGNOSIS — F17210 Nicotine dependence, cigarettes, uncomplicated: Secondary | ICD-10-CM

## 2018-01-05 MED ORDER — CITALOPRAM HYDROBROMIDE 20 MG PO TABS: 20.0000 mg | ORAL_TABLET | Freq: Every day | ORAL | 3 refills | Status: DC

## 2018-01-05 MED ORDER — AMITRIPTYLINE HCL 25 MG PO TABS: 25.0000 mg | ORAL_TABLET | Freq: Every day | ORAL | 3 refills | Status: DC

## 2018-01-05 NOTE — Patient Instructions (Addendum)
Start Citalopram (Celexa) today   Start Elavil at NIGHT in 2 weeks (Amitriptyline)   Then around your birthday start Wellbutrin  I will call you with blood work results  Quit Date: 52th Birthday!!!!!   It was wonderful to see you today.  Thank you for choosing [facility].   Please call [phone] with any questions about today's appointment.  Please be sure to schedule follow up at the front  desk before you leave today.   Dorris Singh, MD  Family Medicine

## 2018-01-05 NOTE — Progress Notes (Signed)
Patient Name: Shelley Mason Date of Birth: 08-Dec-1965 Date of Visit: 01/05/18 PCP: Martyn Malay, MD  Chief Complaint: sleeping issues, left ear drainage at times   Subjective: Shelley Mason is a pleasant 52 y.o. 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  She is overall doing okay.  She just completed her last round of radiation therapy and is scheduled for repeat CT to evaluate results.   The patient reports overall she is feeling okay.  She reports her main problem is sleep.  She has chronic left-sided neck pain and a chronic left-sided headache.  She reports that she has anxiety about many things in life.  She used to use alcohol to help her go to sleep.  Over the last 6 months she has stopped using any alcohol at all.  She reports when she goes to sleep normally does her pain keep her up but she also worries about everything she worries about her job.  She worries about finances.  She cannot turn her mind off.  She reports she is not particular interested in seeing a therapist.  She talks a lot of people from her family.  She is a very close network of friends and family who have been helping her over the past few months.  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  She is not taking Celexa or her Wellbutrin at this time.  The patient is interested in smoking cessation.  She has a lot of questions regarding the side effects and dosing of Wellbutrin.  She is currently smoking about a pack per day.  She has set a quit date of her 52th birthday which is [birth year].    She reports that times over the last few months she has had otorrhea.  This is improved considerably over the last week.  She denies fevers,  chills, difficulty swallowing.  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   The patient reports worsening cold intolerance.  She reports this started around the time of radiation.  She reports some fatigue as well.  She reports sleep difficulties as above.  She has had weight loss with her radiation therapy.  She denies hematochezia or melena.  She knows she is due for colon cancer screening at age 25 which is coming up soon.  She is status post a hysterectomy for benign reasons.  ROS:  ROS Negative for fevers, chest pain, difficulty breathing.  I have reviewed the patient's medical, surgical, family, and social history as appropriate.   Vitals:   01/05/18 1053  BP: 122/72  Pulse: 97  Temp: 98.4 F (36.9 C)  SpO2: 99%   Filed Weights   01/05/18 1053  Weight: 186 lb (84.4 kg)   HEENT: Sclera anicteric. Left-sided neck edema noted.  No skin changes overlying her left parotid or temporal area.  Hair loss noted along bilateral eyebrows.  Bilateral external auditory canals clean with minimal cerumen no erythema or drainage. Neck: Supple Cardiac: Regular rate and rhythm. Normal S1/S2. No murmurs, rubs, or gallops appreciated. Lungs: Clear bilaterally to ascultation.  Abdomen: Normoactive bowel sounds. No tenderness to deep or  light palpation. No rebound or guarding.  Extremities: Warm, well perfused without edema.  Skin: Warm, dry Psych: Pleasant and appropriate    Jenavive was seen today for annual exam.  Diagnoses and all orders for this visit:  Cold intolerance possibly due to weight loss or underlying anemia we will check thyroid given her history of radiation to the area -     Ferritin -     TSH  Insomnia,  we will trial a TCA as this may help with her headache, pain and sleep.  I suspect her difficulty with sleep may be related to her underlying anxiety.  We discussed sleep  hygiene at length. -     amitriptyline (ELAVIL) 25 MG tablet; Take 1 tablet (25 mg total) by mouth at bedtime.  Need for immunization against influenza -     Flu Vaccine QUAD 36+ mos IM  Generalized anxiety disorder -     citalopram (CELEXA) 20 MG tablet; Take 1 tablet (20 mg total) by mouth daily. - We reviewed the signs of serotonin syndrome.  She is starting citalopram this week.  She will start Elavil in 2 weeks time.  She will not start Wellbutrin until she has firmly set a quit date and will start this 1 week before her quit date.  HCM CSY at age 67 Congratulated on smoking cessation  UTD on mammogram   Dorris Singh, MD  [facility] Medicine Teaching Service

## 2018-01-06 ENCOUNTER — Encounter: Payer: Self-pay | Admitting: Family Medicine

## 2018-01-06 LAB — TSH: TSH: 1.32 u[IU]/mL (ref 0.450–4.500)

## 2018-01-06 LAB — FERRITIN: FERRITIN: 262 ng/mL — AB (ref 15–150)

## 2018-02-05 ENCOUNTER — Encounter: Payer: Self-pay | Admitting: Radiation Oncology

## 2018-02-05 NOTE — Progress Notes (Signed)
Shelley Mason presents for follow up of radiation completed 11/06/17 to her left parotid area.   Pain issues, if any: Occasional throat pain. Not everyday.  Using a feeding tube?: N/A Weight changes, if any:  Wt Readings from Last 3 Encounters:  02/10/18 186 lb 3.2 oz (84.5 kg)  01/05/18 186 lb (84.4 kg)  12/23/17 187 lb (84.8 kg)   Swallowing issues, if any: She tells me that she is eating well.  Smoking or chewing tobacco? She is smoking about 10 cigarettes daily.  Using fluoride trays daily? She is using fluoride toothpaste.  Last ENT visit was on: Dr. Constance Holster 12/29/17, next in January.  Other notable issues, if any:  She reports a stiffness in her neck which causes difficulty swallowing at times. She is performing exercises as recommended by ST.  Her teeth are breaking off at times.  She has been referred by Dr. Enrique Sack to an oral surgeon. She is having difficulty getting a referral due to insurance problems.  She reports feeling dizzy at times when standing.  CT neck 02/09/18  BP 114/75 (BP Location: Right Arm, Patient Position: Sitting)   Pulse 100   Temp 98.2 F (36.8 C) (Oral)   Resp 20   Ht 5' 4.5" (1.638 m)   Wt 186 lb 3.2 oz (84.5 kg)   SpO2 100%   BMI 31.47 kg/m

## 2018-02-09 ENCOUNTER — Ambulatory Visit (HOSPITAL_COMMUNITY)
Admission: RE | Admit: 2018-02-09 | Discharge: 2018-02-09 | Disposition: A | Discharge status: Home / Self Care | Payer: Self-pay | Source: Ambulatory Visit | Attending: Radiation Oncology | Admitting: Radiation Oncology

## 2018-02-09 ENCOUNTER — Encounter (HOSPITAL_COMMUNITY): Payer: Self-pay

## 2018-02-09 DIAGNOSIS — C07 Malignant neoplasm of parotid gland: Secondary | ICD-10-CM | POA: Insufficient documentation

## 2018-02-09 MED ORDER — IOHEXOL 300 MG/ML  SOLN
75.0000 mL | Freq: Once | INTRAMUSCULAR | Status: AC | PRN
  Administered 2018-02-09: 75 mL via INTRAVENOUS

## 2018-02-09 MED ORDER — SODIUM CHLORIDE (PF) 0.9 % IJ SOLN
INTRAMUSCULAR | Status: AC
  Filled 2018-02-09: qty 50

## 2018-02-10 ENCOUNTER — Encounter: Payer: Self-pay | Admitting: Radiation Oncology

## 2018-02-10 ENCOUNTER — Other Ambulatory Visit: Payer: Self-pay

## 2018-02-10 ENCOUNTER — Ambulatory Visit
Admission: RE | Admit: 2018-02-10 | Discharge: 2018-02-10 | Disposition: A | Discharge status: Home / Self Care | Payer: Self-pay | Source: Ambulatory Visit | Attending: Radiation Oncology | Admitting: Radiation Oncology

## 2018-02-10 VITALS — BP 114/75 | HR 100 | Temp 98.2°F | Resp 20 | Ht 64.5 in | Wt 186.2 lb

## 2018-02-10 DIAGNOSIS — Z79899 Other long term (current) drug therapy: Secondary | ICD-10-CM | POA: Insufficient documentation

## 2018-02-10 DIAGNOSIS — C07 Malignant neoplasm of parotid gland: Secondary | ICD-10-CM | POA: Insufficient documentation

## 2018-02-10 DIAGNOSIS — F17210 Nicotine dependence, cigarettes, uncomplicated: Secondary | ICD-10-CM | POA: Insufficient documentation

## 2018-02-10 NOTE — Progress Notes (Addendum)
Radiation Oncology         [phone] ________________________________  Name: Shelley Mason MRN: [id number]  Date: 02/10/2018  DOB: 08-07-1965  Follow-Up Visit Note  CC: Martyn Malay, MD  Izora Gala, MD  Diagnosis and Prior Radiotherapy:       ICD-10-CM   1. 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 Cancer Staging Malignant neoplasm of parotid gland [facility]) Staging form: Major Salivary Glands, AJCC 8th Edition - Clinical: No stage assigned - Unsigned - Pathologic: Stage II (pT2, pN0, cM0) - Signed by Eppie Gibson, MD on 09/17/2017   Radiation treatment dates:   09/22/2017 to 11/06/2017 Site/dose:   The Left parotid bed and distal facial nerve pathway were treated to 64 Gy in 32 fractions of 2 Gy.  CHIEF COMPLAINT:  Here for follow-up and surveillance of left parotid cancer  Narrative:  The patient returns today for routine follow-up of radiation completed 11/06/17 to her left parotid bed. She had a CT scan of the neck done yesterday that showed enhancement of the remaining parotid tissue and of the left submandibular tissue to a lesser extent presumed secondary to recent radiation.  Nonspecific nodes are favored to be reactive.  Pain issues, if any: She reports persistent left ear pain and muffled hearing. She reports occasional throat pain.   Weight changes, if any:  Wt Readings from Last 3 Encounters:  02/10/18 186 lb 3.2 oz (84.5 kg)  01/05/18 186 lb (84.4 kg)  12/23/17 187 lb (84.8 kg)   Swallowing issues, if any: She states that she is eating well.  Smoking or chewing tobacco? She is smoking about 10 cigarettes daily.   Using fluoride trays daily? She is using fluoride toothpaste.   Last ENT visit was on: 12/29/17 with Dr. Constance Holster. She will see him again in January  for a hearing test.   Other notable issues, if any: She reports a pulling sensation and stiffness in her neck which causes difficulty swallowing at times. She is performing exercises as recommended by ST. She reports dry mouth, unchanged. She reports her teeth are breaking off on both sides. She has been referred by Dr. Enrique Sack to an oral surgeon. She reports balance issues and feeling dizzy at times when standing.   ALLERGIES:  is allergic to penicillins and codeine.  Meds: Current Outpatient Medications  Medication Sig Dispense Refill  . amitriptyline (ELAVIL) 25 MG tablet Take 1 tablet (25 mg total) by mouth at bedtime. 90 tablet 3  . citalopram (CELEXA) 20 MG tablet Take 1 tablet (20 mg total) by mouth daily. 90 tablet 3  . sodium fluoride (PREVIDENT 5000 PLUS) 1.1 % CREA dental cream Apply cream to tooth brush. Brush teeth for 2 minutes. Spit out excess. DO NOT rinse afterwards. Repeat nightly. 1 Tube PRN  . buPROPion (WELLBUTRIN SR) 150 MG 12 hr tablet Start one week before quit date. Take 1 tab daily x 3 days, then 1 tab BID thereafter. (Patient not taking: Reported on 11/21/2017) 60 tablet 2  . lidocaine (XYLOCAINE) 2 % solution Use as directed 15 mLs in the mouth or throat as needed for mouth pain (prior to meals). Swish and swallow (Patient not taking: Reported on 02/10/2018) 150 mL 1  . LORazepam (ATIVAN) 0.5 MG tablet Take 1 tab prior to radiotherapy PRN anxiety. (Patient not taking: Reported on 11/21/2017)  20 tablet 0  . meclizine (ANTIVERT) 25 MG tablet Take 1 tablet (25 mg total) by mouth 3 (three) times daily as needed for dizziness. (Patient not taking: Reported on 02/10/2018) 30 tablet 0  . nicotine (NICODERM CQ - DOSED IN MG/24 HOURS) 14 mg/24hr patch Apply 21 mg patch daily x 6 wk, then 14mg  patch daily x 2 wk, then 7 mg patch daily x 2 wk (Patient not taking: Reported on 09/18/2017) 14 patch 0  . nicotine (NICODERM CQ - DOSED IN MG/24 HOURS) 21 mg/24hr patch Apply 21 mg patch  daily x 6 wk, then 14mg  patch daily x 2 wk, then 7 mg patch daily x 2 wk (Patient not taking: Reported on 09/18/2017) 14 patch 2  . nicotine (NICODERM CQ - DOSED IN MG/24 HR) 7 mg/24hr patch Apply 21 mg patch daily x 6 wk, then 14mg  patch daily x 2 wk, then 7 mg patch daily x 2 wk (Patient not taking: Reported on 09/18/2017) 14 patch 0   No current facility-administered medications for this encounter.     Physical Findings: Wt Readings from Last 3 Encounters:  02/10/18 186 lb 3.2 oz (84.5 kg)  01/05/18 186 lb (84.4 kg)  12/23/17 187 lb (84.8 kg)    height is 5' 4.5" (1.638 m) and weight is 186 lb 3.2 oz (84.5 kg). Her oral temperature is 98.2 F (36.8 C). Her blood pressure is 114/75 and her pulse is 100. Her respiration is 20 and oxygen saturation is 100%.  General: Alert and oriented, in no acute distress. HEENT: Head is normocephalic. Oral cavity and oropharynx are clear. The left tympanic membrane is very slightly erythematous superiorly. She appears to have dryness within the left ear canal. Neck: Neck is supple, no palpable cervical or supraclavicular lymphadenopathy. Skin: Her skin is still a little hyperpigmented in the radiation fields but it's healed very well.  Neurologic: No obvious focalities. Speech is fluent. Coordination is intact. Psychiatric: Judgment and insight are intact. Affect is appropriate.   Lab Findings: Lab Results  Component Value Date   WBC 7.3 10/13/2017   HGB 12.7 10/13/2017   HCT 38.2 10/13/2017   MCV 96.2 10/13/2017   PLT 292 10/13/2017   CMP     Component Value Date/Time   NA 140 10/13/2017 2308   NA 139 04/18/2017 1640   K 4.5 10/13/2017 2308   CL 110 10/13/2017 2308   CO2 23 10/13/2017 2308   GLUCOSE 96 10/13/2017 2308   BUN 13 10/13/2017 2308   BUN 19 04/18/2017 1640   CREATININE 0.71 10/13/2017 2308   CALCIUM 8.9 10/13/2017 2308   PROT 6.9 10/13/2017 2308   ALBUMIN 3.7 10/13/2017 2308   AST 23 10/13/2017 2308   ALT 13 10/13/2017 2308    ALKPHOS 52 10/13/2017 2308   BILITOT 1.0 10/13/2017 2308   GFRNONAA >60 10/13/2017 2308   GFRAA >60 10/13/2017 2308    Lab Results  Component Value Date   TSH 1.320 01/05/2018    Radiographic Findings: Ct Soft Tissue Neck W Contrast  Result Date: 02/09/2018 CLINICAL DATA:  Carcinoma of the left parotid gland. Completed radiotherapy. Pulling sensation in the left neck. EXAM: CT NECK WITH CONTRAST TECHNIQUE: Multidetector CT imaging of the neck was performed using the standard protocol following the bolus administration of intravenous contrast. CONTRAST:  56mL OMNIPAQUE IOHEXOL 300 MG/ML  SOLN COMPARISON:  Ultrasound 05/23/2017 FINDINGS: Pharynx and larynx: No mucosal or submucosal lesion is seen. Salivary glands: Right parotid and submandibular glands are normal.  Left parotid and submandibular gland show mild generalized enhancement probably secondary to recent radiation. There is postsurgical scarring of the skin and subcutaneous tissues, confluent with the sternocleidomastoid muscle on the left. This is presumed represent change related to the surgery and radiation. One could not exclude the possibility of tumor within this tissue, but that is not presumed. Certainly, the findings could be associated with the pulling symptoms described by the patient, based on adhesion to the sternocleidomastoid muscle. Thyroid: Normal Lymph nodes: Slightly prominent level 5 nodes on the left with low level enhancement. This is an indeterminate finding and could be subsequent to the recent radiation. I cannot completely exclude metastatic disease to those nodes, but think that is less likely. Vascular: Normal Limited intracranial: Normal Visualized orbits: Normal Mastoids and visualized paranasal sinuses: Sinuses are clear. Small left mastoid effusion. Skeleton: Ordinary mild cervical spondylosis. Upper chest: Normal Other: None IMPRESSION: Previous surgery and radiation for treatment of a left parotid carcinoma.  Enhancement of the remaining parotid tissue and of the left submandibular tissue to a lesser extent presumed secondary to the recent radiation. Soft tissue density of the skin, subcutaneous fat and with confluent involvement of the sternocleidomastoid muscle presumed to represent post surgical and radiation scarring. This could certainly be associated with what the patient describes as a pulling sensation of the left neck. I cannot rule out the possibility of tumor within this tissue, but do not favor that. Slightly prominent and enhancing level 5 lymph nodes on the left, favored be secondary to the regional treatment. Again, I can't completely rule out metastatic involvement of these nodes but think that is less likely. Electronically Signed   By: Nelson Chimes M.D.   On: 02/09/2018 09:36    Impression/Plan:    1) Head and Neck Cancer Status: Healing well from radiation therapy. Favored NED. Will order another CT neck in 72mo.  2) Nutritional Status: Stable  PEG tube: No  3) Risk Factors: The patient has been educated about risk factors including alcohol and tobacco abuse; they understand that avoidance of alcohol and tobacco is important to prevent recurrences as well as other cancers. Urged to keep cutting down on smoking as this inhibit her healing and increase risk of other malignancies  4) Swallowing: Functional.  5) Dental: She has been referred back to Dr. Albin Felling for further evaluation.  Possible extractions w/ oral surgery in future  6) Thyroid function: WNL. Unlikely to be affected by ipsilateral RT, re-check only if symptoms warrant. Lab Results  Component Value Date   TSH 1.320 01/05/2018    7) ENT: Continue routine follow-up with Dr. Constance Holster for hearing loss.   Order for Ativan to [place] for future CT scan - claustrophobia  8) I will arrange a CT soft tissue neck in 3 months and a follow-up with Dr. Constance Holster right after. Follow up with me in 6 months. The patient  was encouraged to call with any issues or questions before then.  Patient was given an informational document that our care team curated for head and neck cancer survivors to help with symptom management,and overall post-treatment wellness. Phone number of [name], RN, our Head and Neck Oncology Navigator was provided in case any questions arise.  In a face to face visit lasting over 15 minutes, greater than 50% of the time was spent discussing logistics of treatment, and coordinating the patient's care.   _____________________________________   Eppie Gibson, MD  This document serves as a record of  services personally performed by Eppie Gibson, MD. It was created on her behalf by Rae Lips, a trained medical scribe. The creation of this record is based on the scribe's personal observations and the provider's statements to them. This document has been checked and approved by the attending provider.

## 2018-02-13 ENCOUNTER — Encounter: Payer: Self-pay | Admitting: Radiation Oncology

## 2018-02-13 ENCOUNTER — Other Ambulatory Visit: Payer: Self-pay | Admitting: Radiation Oncology

## 2018-02-13 DIAGNOSIS — C07 Malignant neoplasm of parotid gland: Secondary | ICD-10-CM

## 2018-02-16 ENCOUNTER — Telehealth (HOSPITAL_COMMUNITY): Payer: Self-pay | Admitting: Dentistry

## 2018-02-16 NOTE — Telephone Encounter (Signed)
02/16/2018  Patient:            MAHA FISCHEL Date of Birth:  [birth year] MRN:                [id number]     I received a call on answer machine from the patient requesting evaluation for lower right molar toothache. I then performed a chart review.  The patient had been referred to Dr. Hoyt Koch, oral surgeon, for evaluation for extraction of tooth numbers 2/30 on 12/23/2017. I contacted Dr. Lupita Leash office and spoke with Aldona Bar. Aldona Bar indicated that they had called her on 3 separate occassions, leaving messages at all times, in an attempt to schedule the patient for the consultation appointment for extraction procedures. Patient never returned call per [facility]. I then contacted the patient and found that tooth #30 and broken off at the gum line in a a more extensive fashion. Tooth is starting to hurt her by report. I then instructed her to contact Dr. Lupita Leash office to schedule the consultation and extraction procedure appointment. Alternatively, the patient may contact the dentist of her choice for evaluation for extraction procedures. Patient expressed understanding. She was given the number to Dr. Diona Browner to arrange for consultation and extraction appointment.  Patient also may follow-up with the emergency department if symptoms and significant swelling arise.  Dr. Enrique Sack

## 2018-03-17 ENCOUNTER — Other Ambulatory Visit: Payer: Self-pay

## 2018-03-17 ENCOUNTER — Encounter (HOSPITAL_COMMUNITY): Payer: Self-pay | Admitting: Emergency Medicine

## 2018-03-17 ENCOUNTER — Emergency Department (HOSPITAL_COMMUNITY): Payer: BLUE CROSS/BLUE SHIELD

## 2018-03-17 ENCOUNTER — Emergency Department (HOSPITAL_COMMUNITY)
Admission: EM | Admit: 2018-03-17 | Discharge: 2018-03-17 | Disposition: A | Discharge status: Home / Self Care | Payer: BLUE CROSS/BLUE SHIELD | Attending: Emergency Medicine | Admitting: Emergency Medicine

## 2018-03-17 DIAGNOSIS — M545 Low back pain: Secondary | ICD-10-CM | POA: Diagnosis present

## 2018-03-17 DIAGNOSIS — M5442 Lumbago with sciatica, left side: Secondary | ICD-10-CM | POA: Diagnosis not present

## 2018-03-17 DIAGNOSIS — F17210 Nicotine dependence, cigarettes, uncomplicated: Secondary | ICD-10-CM | POA: Diagnosis not present

## 2018-03-17 DIAGNOSIS — M5440 Lumbago with sciatica, unspecified side: Secondary | ICD-10-CM

## 2018-03-17 DIAGNOSIS — Z79899 Other long term (current) drug therapy: Secondary | ICD-10-CM | POA: Diagnosis not present

## 2018-03-17 MED ORDER — PREDNISONE 20 MG PO TABS: 40.0000 mg | ORAL_TABLET | Freq: Every day | ORAL | 0 refills | Status: AC

## 2018-03-17 MED ORDER — NAPROXEN 375 MG PO TABS
375.0000 mg | ORAL_TABLET | Freq: Once | ORAL | Status: AC
  Administered 2018-03-17: 375 mg via ORAL
  Filled 2018-03-17: qty 1

## 2018-03-17 MED ORDER — METHOCARBAMOL 500 MG PO TABS: 500.0000 mg | ORAL_TABLET | Freq: Two times a day (BID) | ORAL | 0 refills | Status: AC

## 2018-03-17 MED ORDER — METHOCARBAMOL 500 MG PO TABS
500.0000 mg | ORAL_TABLET | Freq: Once | ORAL | Status: AC
  Administered 2018-03-17: 500 mg via ORAL
  Filled 2018-03-17: qty 1

## 2018-03-17 NOTE — ED Triage Notes (Signed)
Pt reports helped a friend move last week and c/o right lower back pains since esp with movement. Denies falls or any other injuries.

## 2018-03-17 NOTE — ED Provider Notes (Signed)
[place] DEPT Provider Note   CSN: [id number] Arrival date & time: 03/17/18  1147     History   Chief Complaint Chief Complaint  Patient presents with  . Back Pain    HPI Shelley Mason is a 53 y.o. female.  53 y.o female with a PMH of Anemia, Fibroids presents to the ED with a chief plaint of lower back pain x1 week.  Does began after helping a friend move furniture over a week ago.  Describes her pain as pulling in the right lower region worse with turning and elevation of her right arm.  Reports this pain radiates to the front.  Patient reports a previous history of a herniated disc which she had recent injections for but has not had follow-up any specialist.  She has tried taking Tylenol to manage her pain but reports the pain is excruciating today.  She denies any urinary symptoms, bowel incontinence, IV drug use or previous history of cancer.     Past Medical History:  Diagnosis Date  . Anemia    history of  . Fibroids    history of uterine fibroids  . History of radiation therapy 09/22/17- 11/06/17   tumor bed left neck with added margin o facial nerve.   . Primary cancer of parotid gland [facility])     Patient Active Problem List   Diagnosis Date Noted  . Malignant tumor of parotid gland ([place]) 09/17/2017  . Depression 04/21/2017  . Cigarette smoker 06/21/2015    Past Surgical History:  Procedure Laterality Date  . CESAREAN SECTION     x 3  . CYSTOSCOPY    . LAPAROSCOPIC ASSISTED VAGINAL HYSTERECTOMY    . PAROTIDECTOMY Left   . TUBAL LIGATION       OB History   No obstetric history on file.      Home Medications    Prior to Admission medications   Medication Sig Start Date End Date Taking? Authorizing Provider  amitriptyline (ELAVIL) 25 MG tablet Take 1 tablet (25 mg total) by mouth at bedtime. 01/05/18   Martyn Malay, MD  buPROPion [facility] SR) 150 MG 12 hr tablet Start one week before quit date. Take 1 tab daily x  3 days, then 1 tab BID thereafter. Patient not taking: Reported on 11/21/2017 09/17/17   Eppie Gibson, MD  citalopram (CELEXA) 20 MG tablet Take 1 tablet (20 mg total) by mouth daily. 01/05/18   Martyn Malay, MD  lidocaine (XYLOCAINE) 2 % solution Use as directed 15 mLs in the mouth or throat as needed for mouth pain (prior to meals). Swish and swallow Patient not taking: Reported on 02/10/2018 10/07/17   Gery Pray, MD  LORazepam (ATIVAN) 0.5 MG tablet Take 1 tab prior to radiotherapy PRN anxiety. Patient not taking: Reported on 11/21/2017 09/17/17   Eppie Gibson, MD  meclizine (ANTIVERT) 25 MG tablet Take 1 tablet (25 mg total) by mouth 3 (three) times daily as needed for dizziness. Patient not taking: Reported on 02/10/2018 10/14/17   Jacqlyn Larsen, PA-C  methocarbamol (ROBAXIN) 500 MG tablet Take 1 tablet (500 mg total) by mouth 2 (two) times daily for 7 days. 03/17/18 03/24/18  Janeece Fitting, PA-C  nicotine (NICODERM CQ - DOSED IN MG/24 HOURS) 14 mg/24hr patch Apply 21 mg patch daily x 6 wk, then 14mg  patch daily x 2 wk, then 7 mg patch daily x 2 wk Patient not taking: Reported on 09/18/2017 09/17/17   Eppie Gibson, MD  nicotine (NICODERM CQ - DOSED IN MG/24 HOURS) 21 mg/24hr patch Apply 21 mg patch daily x 6 wk, then 14mg  patch daily x 2 wk, then 7 mg patch daily x 2 wk Patient not taking: Reported on 09/18/2017 09/17/17   Eppie Gibson, MD  nicotine (NICODERM CQ - DOSED IN MG/24 HR) 7 mg/24hr patch Apply 21 mg patch daily x 6 wk, then 14mg  patch daily x 2 wk, then 7 mg patch daily x 2 wk Patient not taking: Reported on 09/18/2017 09/17/17   Eppie Gibson, MD  predniSONE (DELTASONE) 20 MG tablet Take 2 tablets (40 mg total) by mouth daily with breakfast for 5 doses. 03/17/18 03/22/18  Janeece Fitting, PA-C  sodium fluoride (PREVIDENT 5000 PLUS) 1.1 % CREA dental cream Apply cream to tooth brush. Brush teeth for 2 minutes. Spit out excess. DO NOT rinse afterwards. Repeat nightly. 12/23/17   Lenn Cal, DDS    Family History Family History  Problem Relation Age of Onset  . Hyperlipidemia Mother   . Hypertension Mother   . Heart failure Mother   . Prostate cancer Father   . Hyperlipidemia Sister   . Heart disease Other        family history  . Diabetes Other        family history  . Hypertension Other        family history    Social History Social History   Tobacco Use  . Smoking status: Current Every Day Smoker    Packs/day: 1.00    Types: Cigarettes    Start date: 06/20/2000  . Smokeless tobacco: Never Used  . Tobacco comment: She is smoking 10 cigarettes daily.   Substance Use Topics  . Alcohol use: Not Currently    Alcohol/week: 0.0 standard drinks    Comment: occasionally  . Drug use: No     Allergies   Penicillins and Codeine   Review of Systems Review of Systems  Constitutional: Negative for fever.  Genitourinary: Negative for difficulty urinating.  Musculoskeletal: Positive for back pain and myalgias.     Physical Exam Updated Vital Signs BP 120/82 (BP Location: Right Arm)   Pulse 97   Temp 98.5 F (36.9 C) (Oral)   Resp 15   Ht 5' 4.5" (1.638 m)   Wt 83.5 kg   SpO2 100%   BMI 31.10 kg/m   Physical Exam Vitals signs and nursing note reviewed.  Constitutional:      General: She is not in acute distress.    Appearance: She is well-developed.  HENT:     Head: Normocephalic and atraumatic.     Mouth/Throat:     Pharynx: No oropharyngeal exudate.  Eyes:     Pupils: Pupils are equal, round, and reactive to light.  Neck:     Musculoskeletal: Normal range of motion.  Cardiovascular:     Rate and Rhythm: Regular rhythm.     Heart sounds: Normal heart sounds.  Pulmonary:     Effort: Pulmonary effort is normal. No respiratory distress.     Breath sounds: Normal breath sounds.  Abdominal:     General: Bowel sounds are normal. There is no distension.     Palpations: Abdomen is soft.     Tenderness: There is no abdominal  tenderness. There is no right CVA tenderness or left CVA tenderness.  Musculoskeletal:        General: No tenderness or deformity.     Lumbar back: She exhibits no pain.  Back:     Right lower leg: No edema.     Left lower leg: No edema.     Comments: No midline tenderness.Tenderness to palpation along the right flank worse on the lower part of her back.   Skin:    General: Skin is warm and dry.  Neurological:     Mental Status: She is alert and oriented to person, place, and time.      ED Treatments / Results  Labs (all labs ordered are listed, but only abnormal results are displayed) Labs Reviewed - No data to display  EKG None  Radiology Dg Lumbar Spine 2-3 Views  Result Date: 03/17/2018 CLINICAL DATA:  Lumbago after moving furniture EXAM: LUMBAR SPINE - 2-3 VIEW COMPARISON:  None. FINDINGS: Standing frontal, standing lateral, and spot lumbosacral lateral images were obtained. There are 5 non-rib-bearing lumbar type vertebral bodies. There is mild levoscoliosis. There is no fracture or spondylolisthesis. There is moderate disc space narrowing at L5-S1. Other disc spaces appear unremarkable. There is facet osteoarthritic change at L4-5 and L5-S1 bilaterally. IMPRESSION: Mild scoliosis. Lower lumbar osteoarthritic change with moderate disc space narrowing at L5-S1. No fracture or spondylolisthesis. Electronically Signed   By: Lowella Grip III M.D.   On: 03/17/2018 13:39    Procedures Procedures (including critical care time)  Medications Ordered in ED Medications  methocarbamol (ROBAXIN) tablet 500 mg (500 mg Oral Given 03/17/18 1218)  naproxen (NAPROSYN) tablet 375 mg (375 mg Oral Given 03/17/18 1218)     Initial Impression / Assessment and Plan / ED Course  I have reviewed the triage vital signs and the nursing notes.  Pertinent labs & imaging results that were available during my care of the patient were reviewed by me and considered in my medical decision  making (see chart for details).    With a previous history of disc herniation presents to the ED with a chief complaint of lower back pain, she has not had any follow-up in the past few weeks with a specialist.  During evaluation patient is mildly tender on the paraspinal region. Lumbar x-ray ordered to rule out any acute dysfunction. DG lumbar showed: Mild scoliosis. Lower lumbar osteoarthritic change with moderate  disc space narrowing at L5-S1. No fracture or spondylolisthesis.   At this time will treat patient with muscle relaxers and steroids to help with her symptoms.  She will also be given a referral to spine specialist.  Denies any previous history of diabetes.  Ambulatory in the ED with that he gait.  Return precautions discussed at length.   Final Clinical Impressions(s) / ED Diagnoses   Final diagnoses:  Acute left-sided low back pain with sciatica, sciatica laterality unspecified    ED Discharge Orders         Ordered    methocarbamol (ROBAXIN) 500 MG tablet  2 times daily     03/17/18 1343    predniSONE (DELTASONE) 20 MG tablet  Daily with breakfast     03/17/18 1347           Janeece Fitting, PA-C 03/17/18 [place], [place], MD 03/18/18 [phone]

## 2018-03-17 NOTE — ED Notes (Signed)
Bed: WTR5 Expected date:  Expected time:  Means of arrival:  Comments: 

## 2018-03-17 NOTE — Discharge Instructions (Addendum)
I have prescribed muscle relaxers for your pain, please do not drink or drive while taking this medications as they can make you drowsy.  I have also prescribed steroids, be advised this medication can cause insomnia, appetite changes.  These follow-up with PCP in 1 week for reevaluation of your symptoms.  You experience any bowel or bladder incontinence, fever, worsening in your symptoms please return to the ED.

## 2018-06-01 ENCOUNTER — Other Ambulatory Visit: Payer: Self-pay

## 2018-06-01 ENCOUNTER — Ambulatory Visit
Admission: RE | Admit: 2018-06-01 | Discharge: 2018-06-01 | Disposition: A | Discharge status: Home / Self Care | Payer: BLUE CROSS/BLUE SHIELD | Source: Ambulatory Visit | Attending: Family Medicine | Admitting: Family Medicine

## 2018-06-01 DIAGNOSIS — R921 Mammographic calcification found on diagnostic imaging of breast: Secondary | ICD-10-CM

## 2018-08-12 NOTE — Progress Notes (Signed)
Error, duplicate

## 2018-08-13 ENCOUNTER — Telehealth: Payer: Self-pay | Admitting: *Deleted

## 2018-08-13 NOTE — Telephone Encounter (Signed)
CALLED PATIENT TO INFORM OF STAT LABS ON 08-20-18 @ 12:15 PM @ [facility] AND HER CT ON 08-20-18 - ARRIVAL TIME- 1:15 PM @ [facility], PT. TO HAVE WATER ONLY - 4 HRS. PRIOR TO TEST, PATIENT TO FOLLOW-UP WITH DR. Isidore Moos FOR RESULTS ON 08-21-18 @ 11:40 AM, SPOKE WITH PATIENT AND SHE IS AWARE OF THESE APPTS.

## 2018-08-14 ENCOUNTER — Ambulatory Visit
Admission: RE | Admit: 2018-08-14 | Discharge: 2018-08-14 | Disposition: A | Discharge status: Home / Self Care | Payer: BLUE CROSS/BLUE SHIELD | Source: Ambulatory Visit | Attending: Radiation Oncology | Admitting: Radiation Oncology

## 2018-08-19 NOTE — Progress Notes (Signed)
error 

## 2018-08-20 ENCOUNTER — Ambulatory Visit: Payer: BLUE CROSS/BLUE SHIELD

## 2018-08-20 ENCOUNTER — Ambulatory Visit (HOSPITAL_COMMUNITY): Admission: RE | Admit: 2018-08-20 | Payer: BLUE CROSS/BLUE SHIELD | Source: Ambulatory Visit

## 2018-08-20 ENCOUNTER — Telehealth: Payer: Self-pay | Admitting: *Deleted

## 2018-08-20 NOTE — Telephone Encounter (Signed)
Called patient to give info. about appts, lvm for a return call

## 2018-08-21 ENCOUNTER — Telehealth: Payer: Self-pay | Admitting: *Deleted

## 2018-08-21 ENCOUNTER — Ambulatory Visit
Admission: RE | Admit: 2018-08-21 | Discharge: 2018-08-21 | Disposition: A | Discharge status: Home / Self Care | Payer: BLUE CROSS/BLUE SHIELD | Source: Ambulatory Visit | Attending: Radiation Oncology | Admitting: Radiation Oncology

## 2018-08-21 ENCOUNTER — Other Ambulatory Visit: Payer: Self-pay | Admitting: Radiation Oncology

## 2018-08-21 DIAGNOSIS — C07 Malignant neoplasm of parotid gland: Secondary | ICD-10-CM

## 2018-08-21 MED ORDER — LORAZEPAM 0.5 MG PO TABS: ORAL_TABLET | ORAL | 0 refills | Status: DC

## 2018-08-21 NOTE — Telephone Encounter (Signed)
CALLED PATIENT TO INFORM THAT DR. SQUIRE WILL SEE HER ON 09-01-18 @ 3:30 PM, SPOKE WITH PATIENT AND SHE IS AWARE OF THIS APPT. AND SHE IS GOOD WITH IT.

## 2018-08-21 NOTE — Telephone Encounter (Signed)
Called patient to inform that script is ready to be picked-up, spoke with patient and she is aware of this

## 2018-08-24 ENCOUNTER — Telehealth: Payer: Self-pay | Admitting: *Deleted

## 2018-08-24 ENCOUNTER — Ambulatory Visit: Payer: BLUE CROSS/BLUE SHIELD

## 2018-08-24 ENCOUNTER — Ambulatory Visit (HOSPITAL_COMMUNITY): Payer: BLUE CROSS/BLUE SHIELD

## 2018-08-24 NOTE — Telephone Encounter (Signed)
Called patient to inform that her insurance is out of network and therefore would not pay for scan, patient instructed me to cancel lab and scan and fu (lab and scan for 08-24-18 and fu 09-01-18, patient requested call Dr. Isidore Moos on 08-31-18, I informed her that I would pass that info along to the doctor, patient verified understanding this

## 2018-09-01 ENCOUNTER — Ambulatory Visit: Payer: BLUE CROSS/BLUE SHIELD | Admitting: Radiation Oncology

## 2018-09-01 ENCOUNTER — Telehealth: Payer: Self-pay | Admitting: *Deleted

## 2018-09-01 NOTE — Telephone Encounter (Signed)
Returned patient's phone call, spoke with patient 

## 2018-09-01 NOTE — Telephone Encounter (Signed)
CALLED PATIENT TO ASK ABOUT COMING IN FOR FU APPT. ON 09-08-18, LVM FOR A RETURN CALL

## 2018-09-02 ENCOUNTER — Other Ambulatory Visit: Payer: Self-pay | Admitting: Radiation Oncology

## 2018-09-02 DIAGNOSIS — C07 Malignant neoplasm of parotid gland: Secondary | ICD-10-CM

## 2018-09-02 NOTE — Progress Notes (Signed)
Shelley Mason presents for follow up of radiation completed 11/06/17 to her left parotid bed.   Pain issues, if any:  Using a feeding tube?: N/A Weight changes, if any:  Wt Readings from Last 3 Encounters:  09/08/18 197 lb 6 oz (89.5 kg)  03/17/18 184 lb (83.5 kg)  02/10/18 186 lb 3.2 oz (84.5 kg)   Swallowing issues, if any:  Smoking or chewing tobacco? She has recently restarted smoking due to stress. Using fluoride trays daily? She is brushing her teeth with fluoride toothpaste Last ENT visit was on: Dr. Constance Holster 07/06/18, to recheck in 4 months or sooner, if needed.  Other notable issues, if any:  She reports aniexty related to difficulty receiving treatment. She needs to have dental work completed, which is expensive.   BP 128/80 (BP Location: Left Arm, Patient Position: Sitting)   Pulse (!) 103   Temp 98.7 F (37.1 C) (Temporal)   Resp 18   Ht 5\' 5"  (1.651 m)   Wt 197 lb 6 oz (89.5 kg)   SpO2 100%   BMI 32.84 kg/m

## 2018-09-08 ENCOUNTER — Ambulatory Visit
Admission: RE | Admit: 2018-09-08 | Discharge: 2018-09-08 | Disposition: A | Discharge status: Home / Self Care | Payer: BLUE CROSS/BLUE SHIELD | Source: Ambulatory Visit | Attending: Radiation Oncology | Admitting: Radiation Oncology

## 2018-09-08 ENCOUNTER — Other Ambulatory Visit: Payer: Self-pay

## 2018-09-08 ENCOUNTER — Encounter: Payer: Self-pay | Admitting: Radiation Oncology

## 2018-09-08 VITALS — BP 128/80 | HR 103 | Temp 98.7°F | Resp 18 | Ht 65.0 in | Wt 197.4 lb

## 2018-09-08 DIAGNOSIS — R42 Dizziness and giddiness: Secondary | ICD-10-CM | POA: Insufficient documentation

## 2018-09-08 DIAGNOSIS — Z79899 Other long term (current) drug therapy: Secondary | ICD-10-CM | POA: Diagnosis not present

## 2018-09-08 DIAGNOSIS — Z923 Personal history of irradiation: Secondary | ICD-10-CM | POA: Insufficient documentation

## 2018-09-08 DIAGNOSIS — Z85819 Personal history of malignant neoplasm of unspecified site of lip, oral cavity, and pharynx: Secondary | ICD-10-CM | POA: Insufficient documentation

## 2018-09-08 DIAGNOSIS — H81312 Aural vertigo, left ear: Secondary | ICD-10-CM | POA: Insufficient documentation

## 2018-09-08 DIAGNOSIS — C07 Malignant neoplasm of parotid gland: Secondary | ICD-10-CM

## 2018-09-08 NOTE — Progress Notes (Signed)
Radiation Oncology         (336) [phone] ________________________________  Name: Shelley Mason MRN: [id number]  Date: 09/08/2018  DOB: 07-01-1965  Follow-Up Visit Note  CC: Bartholome Bill, MD  Izora Gala, MD  Diagnosis and Prior Radiotherapy:       ICD-10-CM   1. 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  Cancer Staging Malignant neoplasm of parotid gland [facility]) Staging form: Major Salivary Glands, AJCC 8th Edition - Clinical: No stage assigned - Unsigned - Pathologic: Stage II (pT2, pN0, cM0) - Signed by Eppie Gibson, MD on 09/17/2017   Radiation treatment dates:   09/22/2017 to 11/06/2017 Site/dose:   The Left parotid bed and distal facial nerve pathway were treated to 64 Gy in 32 fractions of 2 Gy.  CHIEF COMPLAINT:  Here for follow-up and surveillance of left parotid cancer  Narrative:  The patient returns today for routine follow-up of radiation completed 11/06/17 to her left parotid bed. Repeat neck CT scan was ordered but has not yet been scheduled due to insurance issues, but our staff is working on helping this go through.  Smoking or chewing tobacco? She has recently restarted smoking due to stress. Using fluoride trays daily? She is brushing her teeth with fluoride toothpaste Last ENT visit was on: Dr. Constance Holster 07/06/18, to recheck in 4 months or sooner, if needed.  Other notable issues, if any:  She reports aniexty related to difficulty receiving treatment. She needs to have dental work completed, which is expensive.  Recently started meclizine via her PCP for Vertigo, <1 wk ago. It makes her very sleepy.  She reports the vertigo is debilitating, and happens when she changes positions, such as standing, but also bending over.  Recently, she fell and hurt her right hand.  She reports left hearing loss, may need a hearing aid via Dr Constance Holster.  Reports "it feels like I have swimmer's ear."  She states she cannot work due to equilibrium/mobility issues.  Weight changes, if any:  Wt Readings from Last 3 Encounters:  09/08/18 197 lb 6 oz (89.5 kg)  03/17/18 184 lb (83.5 kg)  02/10/18 186 lb 3.2 oz (84.5 kg)     ALLERGIES:  is allergic to penicillins and codeine.  Meds: Current Outpatient Medications  Medication Sig Dispense Refill   amitriptyline (ELAVIL) 25 MG tablet Take 1 tablet (25 mg total) by mouth at bedtime. 90 tablet 3   citalopram (CELEXA) 20 MG tablet Take 1 tablet (20 mg total) by mouth daily. 90 tablet 3   lidocaine (XYLOCAINE) 2 % solution Use as directed 15 mLs in the mouth or throat as needed for mouth pain (prior to meals). Swish and swallow 150 mL 1   LORazepam (ATIVAN) 0.5 MG tablet Take 1 tab PO 20 min prior to scan in radiology PRN anxiety. 1 tablet 0   meclizine (ANTIVERT) 25 MG tablet Take 1 tablet (25 mg total) by mouth 3 (three) times daily as needed for dizziness. 30 tablet 0   sodium fluoride (PREVIDENT 5000 PLUS) 1.1 % CREA dental cream Apply cream to tooth brush. Brush teeth for 2 minutes. Spit out excess. DO NOT rinse afterwards. Repeat nightly. 1 Tube PRN   buPROPion (WELLBUTRIN SR) 150 MG 12 hr tablet Start one week before  quit date. Take 1 tab daily x 3 days, then 1 tab BID thereafter. (Patient not taking: Reported on 11/21/2017) 60 tablet 2   nicotine (NICODERM CQ - DOSED IN MG/24 HOURS) 14 mg/24hr patch Apply 21 mg patch daily x 6 wk, then 14mg  patch daily x 2 wk, then 7 mg patch daily x 2 wk (Patient not taking: Reported on 09/18/2017) 14 patch 0   nicotine (NICODERM CQ - DOSED IN MG/24 HOURS) 21 mg/24hr patch Apply 21 mg patch daily x 6 wk, then 14mg  patch daily x 2 wk, then 7 mg patch daily x 2 wk (Patient not taking: Reported on 09/18/2017) 14 patch 2   nicotine (NICODERM CQ -  DOSED IN MG/24 HR) 7 mg/24hr patch Apply 21 mg patch daily x 6 wk, then 14mg  patch daily x 2 wk, then 7 mg patch daily x 2 wk (Patient not taking: Reported on 09/18/2017) 14 patch 0   No current facility-administered medications for this encounter.     Physical Findings: Wt Readings from Last 3 Encounters:  09/08/18 197 lb 6 oz (89.5 kg)  03/17/18 184 lb (83.5 kg)  02/10/18 186 lb 3.2 oz (84.5 kg)    height is 5\' 5"  (1.651 m) and weight is 197 lb 6 oz (89.5 kg). Her temporal temperature is 98.7 F (37.1 C). Her blood pressure is 128/80 and her pulse is 103 (abnormal). Her respiration is 18 and oxygen saturation is 100%.  General: Alert and oriented, in no acute distress. HEENT: Head is normocephalic The left tympanic membrane is erythematous and somewhat opaque Neck: Neck is supple, no palpable pre/post auricular, cervical or supraclavicular lymphadenopathy. Psychiatric: Judgment and insight are intact. Affect is appropriate  Ext: brace on right hand/wrist   Lab Findings: Lab Results  Component Value Date   WBC 7.3 10/13/2017   HGB 12.7 10/13/2017   HCT 38.2 10/13/2017   MCV 96.2 10/13/2017   PLT 292 10/13/2017   CMP     Component Value Date/Time   NA 140 10/13/2017 2308   NA 139 04/18/2017 1640   K 4.5 10/13/2017 2308   CL 110 10/13/2017 2308   CO2 23 10/13/2017 2308   GLUCOSE 96 10/13/2017 2308   BUN 13 10/13/2017 2308   BUN 19 04/18/2017 1640   CREATININE 0.71 10/13/2017 2308   CALCIUM 8.9 10/13/2017 2308   PROT 6.9 10/13/2017 2308   ALBUMIN 3.7 10/13/2017 2308   AST 23 10/13/2017 2308   ALT 13 10/13/2017 2308   ALKPHOS 52 10/13/2017 2308   BILITOT 1.0 10/13/2017 2308   GFRNONAA >60 10/13/2017 2308   GFRAA >60 10/13/2017 2308    Lab Results  Component Value Date   TSH 1.320 01/05/2018    Radiographic Findings: No results found.  Impression/Plan:    1) Head and Neck Cancer Status: NED, pending CT, has residual side effects  2) Nutritional Status:  Stable  PEG tube: No  3) Risk Factors: The patient has been educated about risk factors including alcohol and tobacco abuse; they understand that avoidance of alcohol and tobacco is important to prevent recurrences as well as other cancers. Still smoking  4) Swallowing: Functional.  5) Dental: She has been referred back to Dr. Albin Felling for further evaluation.  Possible extractions w/ oral surgery in future. Cost is a factor  6) Thyroid function: WNL. Unlikely to be affected by ipsilateral RT, re-check only if symptoms warrant.  Lab Results  Component Value Date   TSH 1.320 01/05/2018    7)  ENT: Continue routine follow-up with Dr. Constance Holster for hearing loss, vertigo. I called him and discussed this case- [name], RN, our Head and Neck Oncology Navigator will try to move up her f/u to the next 2-3 wks.  8) I will call with CT results, otherwise f/u in 52mo.   I filled out disability paperwork today as she feels she cannot work with her side effects and that is understandable. Hopefully Dr Constance Holster can help resolve her vertigo, I am not sure if  Tympanostomy is reasonable - he will evaluate.   In a face to face visit lasting over 20 minutes, greater than 50% of the time was spent discussing logistics of treatment, and coordinating the patient's care.   _____________________________________   Eppie Gibson, MD  This document serves as a record of services personally performed by Eppie Gibson, MD. It was created on her behalf by Wilburn Mylar, a trained medical scribe. The creation of this record is based on the scribe's personal observations and the provider's statements to them. This document has been checked and approved by the attending provider.

## 2018-09-09 ENCOUNTER — Encounter: Payer: Self-pay | Admitting: Radiation Oncology

## 2018-09-09 ENCOUNTER — Other Ambulatory Visit: Payer: Self-pay | Admitting: Radiation Oncology

## 2018-09-10 ENCOUNTER — Telehealth: Payer: Self-pay | Admitting: *Deleted

## 2018-09-10 NOTE — Telephone Encounter (Signed)
CALLED PATIENT TO INFORM OF FU WITH DR. Isidore Moos ON 03-09-19 @ 2:20 PM, SPOKE WITH PATIENT AND SHE IS AWARE OF THIS APPT.

## 2018-09-10 NOTE — Telephone Encounter (Signed)
Oncology Nurse Navigator Documentation  In follow-up to patient's 7/7 appt with Dr. Isidore Moos and her reporting of hearing loss/vertigo, called [facility] to coordinate follow-up with Dr. Constance Holster.  Spoke with Janett Billow, requested appt in 1-2 weeks per Dr. Pearlie Oyster IB.  She voiced understanding.  [name], RN, BSN Head & Neck Oncology Nurse [place] at [place] [phone]

## 2018-09-11 ENCOUNTER — Ambulatory Visit: Payer: BLUE CROSS/BLUE SHIELD | Admitting: Radiation Oncology

## 2018-09-17 ENCOUNTER — Encounter: Payer: Self-pay | Admitting: *Deleted

## 2018-09-23 ENCOUNTER — Ambulatory Visit
Admission: RE | Admit: 2018-09-23 | Discharge: 2018-09-23 | Disposition: A | Discharge status: Home / Self Care | Payer: Self-pay | Source: Ambulatory Visit | Attending: Radiation Oncology | Admitting: Radiation Oncology

## 2018-09-23 ENCOUNTER — Telehealth: Payer: Self-pay | Admitting: *Deleted

## 2018-09-23 ENCOUNTER — Other Ambulatory Visit: Payer: Self-pay | Admitting: Radiation Oncology

## 2018-09-23 DIAGNOSIS — C07 Malignant neoplasm of parotid gland: Secondary | ICD-10-CM

## 2018-09-23 NOTE — Progress Notes (Signed)
I called the patient today and let her know about the results to her CT scan at [facility].  Overall the scan is reassuring but there are some slightly enlarged asymmetric nodes in the left neck compared to the right.  We will order another CT scan of the neck in December to keep an eye on things.  I will see her in January to discuss the results.  She is pleased with this plan. -----------------------------------  Eppie Gibson, MD

## 2018-09-23 NOTE — Telephone Encounter (Signed)
Oncology Nurse Navigator Documentation  Faxed request to [facility] for the following imaging to be pushed to [employer], notification of successful fax transmission received.   09/16/2018     CT Neck/Thyroid W Contrast  Order placed for imaging to be assigned to [facility] timeline.  [name], RN, BSN Head & Neck Oncology Nurse [place] at [place] [phone]

## 2018-11-19 DIAGNOSIS — R42 Dizziness and giddiness: Secondary | ICD-10-CM | POA: Insufficient documentation

## 2019-02-16 DIAGNOSIS — F419 Anxiety disorder, unspecified: Secondary | ICD-10-CM | POA: Insufficient documentation

## 2019-02-16 DIAGNOSIS — F330 Major depressive disorder, recurrent, mild: Secondary | ICD-10-CM | POA: Insufficient documentation

## 2019-03-08 ENCOUNTER — Encounter: Payer: Self-pay | Admitting: *Deleted

## 2019-03-09 ENCOUNTER — Ambulatory Visit
Admission: RE | Admit: 2019-03-09 | Discharge: 2019-03-09 | Disposition: A | Discharge status: Home / Self Care | Payer: Self-pay | Source: Ambulatory Visit | Attending: Radiation Oncology | Admitting: Radiation Oncology

## 2019-03-09 ENCOUNTER — Encounter: Payer: Self-pay | Admitting: Radiation Oncology

## 2019-03-09 DIAGNOSIS — C07 Malignant neoplasm of parotid gland: Secondary | ICD-10-CM

## 2019-03-09 NOTE — Progress Notes (Signed)
Radiation Oncology         (336) [phone] ________________________________  Name: Shelley Mason MRN: [id number]  Date: 03/09/2019  DOB: 02/04/1966  Follow-Up MyChart video Note due to pandemic risks  CC: Bartholome Bill, MD  Izora Gala, MD  Diagnosis and Prior Radiotherapy:       ICD-10-CM   1. 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  Cancer Staging Malignant neoplasm of parotid gland [facility]) Staging form: Major Salivary Glands, AJCC 8th Edition - Clinical: No stage assigned - Unsigned - Pathologic: Stage II (pT2, pN0, cM0) - Signed by Eppie Gibson, MD on 09/17/2017   Radiation treatment dates:   09/22/2017 to 11/06/2017 Site/dose:   The Left parotid bed and distal facial nerve pathway were treated to 64 Gy in 32 fractions of 2 Gy.  CHIEF COMPLAINT:  Here for follow-up and surveillance of left parotid cancer  Narrative:  The patient returns today for routine follow-up of radiation completed 11/06/17 to her left parotid bed. Repeat neck CT scan performed on 03/04/2019 showed: no evidence of recurrence; asymmetrically prominent left level 2 and 3 lymph nodes which are decreased from prior exam (1.7 cm, previously 2.0 cm) - is per [facility] imaging.   Using fluoride trays daily? She is brushing her teeth with fluoride toothpaste  Last ENT visit was on: Jolene Provost, [place] on 11/19/2018 for follow up of her vertigo and left ear hearing loss. She is also following with Dr Constance Holster. The vertigo, especially, is debilitating.  She is having trouble getting disability enrollment.  She needs a hearing aid but cannot afford one.   Other issues: She reported increased stress and anxiety at her last visit. She has been seen by behavioral health on  02/16/2019.  Weight changes, if any:  Wt Readings from Last 3 Encounters:  09/08/18 197 lb 6 oz (89.5 kg)  03/17/18 184 lb (83.5 kg)  02/10/18 186 lb 3.2 oz (84.5 kg)     ALLERGIES:  is allergic to penicillins and codeine.  Meds: Current Outpatient Medications  Medication Sig Dispense Refill  . amitriptyline (ELAVIL) 25 MG tablet Take 1 tablet (25 mg total) by mouth at bedtime. 90 tablet 3  . citalopram (CELEXA) 20 MG tablet Take 1 tablet (20 mg total) by mouth daily. 90 tablet 3  . meclizine (ANTIVERT) 25 MG tablet Take 1 tablet (25 mg total) by mouth 3 (three) times daily as needed for dizziness. 30 tablet 0  . meclizine (ANTIVERT) 25 MG tablet Take by mouth.    . sodium fluoride (PREVIDENT 5000 PLUS) 1.1 % CREA dental cream Apply cream to tooth brush. Brush teeth for 2 minutes. Spit out excess. DO NOT rinse afterwards. Repeat nightly. 1 Tube PRN  . buPROPion (WELLBUTRIN SR) 150 MG 12 hr tablet Start one week before quit date. Take 1 tab daily x 3 days, then 1 tab BID thereafter. (Patient not taking: Reported on 11/21/2017) 60 tablet 2  . lidocaine (XYLOCAINE) 2 % solution Use as directed 15 mLs in the mouth or throat as needed for mouth pain (prior to meals). Swish and swallow 150 mL 1  . LORazepam (ATIVAN) 0.5 MG tablet Take 1 tab PO 20 min prior to scan in radiology PRN anxiety. 1 tablet 0  . nicotine (NICODERM  CQ - DOSED IN MG/24 HOURS) 14 mg/24hr patch Apply 21 mg patch daily x 6 wk, then 14mg  patch daily x 2 wk, then 7 mg patch daily x 2 wk (Patient not taking: Reported on 09/18/2017) 14 patch 0  . nicotine (NICODERM CQ - DOSED IN MG/24 HOURS) 21 mg/24hr patch Apply 21 mg patch daily x 6 wk, then 14mg  patch daily x 2 wk, then 7 mg patch daily x 2 wk (Patient not taking: Reported on 09/18/2017) 14 patch 2  . nicotine (NICODERM CQ - DOSED IN MG/24 HR) 7 mg/24hr patch Apply 21 mg patch daily x 6 wk, then 14mg  patch daily x 2 wk, then 7 mg patch daily x 2 wk (Patient not taking: Reported on  09/18/2017) 14 patch 0   No current facility-administered medications for this encounter.    Physical Findings: Wt Readings from Last 3 Encounters:  09/08/18 197 lb 6 oz (89.5 kg)  03/17/18 184 lb (83.5 kg)  02/10/18 186 lb 3.2 oz (84.5 kg)    vitals were not taken for this visit.    GEN: alert, oriented, NAD   Lab Findings: Lab Results  Component Value Date   WBC 7.3 10/13/2017   HGB 12.7 10/13/2017   HCT 38.2 10/13/2017   MCV 96.2 10/13/2017   PLT 292 10/13/2017   CMP     Component Value Date/Time   NA 140 10/13/2017 2308   NA 139 04/18/2017 1640   K 4.5 10/13/2017 2308   CL 110 10/13/2017 2308   CO2 23 10/13/2017 2308   GLUCOSE 96 10/13/2017 2308   BUN 13 10/13/2017 2308   BUN 19 04/18/2017 1640   CREATININE 0.71 10/13/2017 2308   CALCIUM 8.9 10/13/2017 2308   PROT 6.9 10/13/2017 2308   ALBUMIN 3.7 10/13/2017 2308   AST 23 10/13/2017 2308   ALT 13 10/13/2017 2308   ALKPHOS 52 10/13/2017 2308   BILITOT 1.0 10/13/2017 2308   GFRNONAA >60 10/13/2017 2308   GFRAA >60 10/13/2017 2308    Lab Results  Component Value Date   TSH 1.320 01/05/2018    Radiographic Findings: No results found.  Impression/Plan:    1) Head and Neck Cancer Status: NED but dealing with presumed side effects of treatment - specifically, vertigo (severe)  2) Nutritional Status: Stable  PEG tube: No  3) Risk Factors: The patient has been educated about risk factors including alcohol and tobacco abuse; they understand that avoidance of alcohol and tobacco is important to prevent recurrences as well as other cancers.    4) Swallowing: Functional.   5) Thyroid function: WNL. Unlikely to be affected by ipsilateral RT, re-check only if symptoms warrant.  Lab Results  Component Value Date   TSH 1.320 01/05/2018    7) ENT: Continue routine follow-up with Dr. Constance Holster and his PA for hearing loss, vertigo.Marland Kitchen  8) f/u in Sept 2021 with repeating CT imaging;  Referral to social work for  multiple stressors.  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  This encounter was provided by telemedicine platform [employer]. The patient has given verbal consent for this type of encounter and has been advised to only accept a meeting of this type in a secure network environment. The time spent  In total on date of service during this encounter was 40 minutes. The attendants for this meeting include Eppie Gibson  and Royston Cowper  Placzek.  During the encounter, Eppie Gibson was located at [facility] Radiation Oncology Department.  Pheona Montreuil Grawe was located at home.    _____________________________________   Eppie Gibson, MD  This document serves as a record of services personally performed by Eppie Gibson, MD. It was created on her behalf by Wilburn Mylar, a trained medical scribe. The creation of this record is based on the scribe's personal observations and the provider's statements to them. This document has been checked and approved by the attending provider.

## 2019-03-10 ENCOUNTER — Encounter: Payer: Self-pay | Admitting: General Practice

## 2019-03-10 NOTE — Progress Notes (Signed)
Shelley Mason Initial Psychosocial Assessment Clinical Social Work  Clinical Social Work contacted by phone to assess psychosocial, emotional, mental health, and spiritual needs of the patient.   Barriers to care/review of distress screen:  - Transportation:  Do you anticipate any problems getting to appointments?  Do you have someone who can help run errands for you if you need it?  Has vehicle but does not drive due to vertigo.  Uses car sparingly.  Asks others to drive for her.  Will not use the bus due to crowds - increases her anxiety.  "When you cant hear and don't know what's going on around you, its a scary situation."  Spends $240/month getting to class at [employer].   - Finances:  Are you concerned about finances.  Returning to work?  If not, applying for disability?  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  Was also full time student at [employer] working towards associates degree.  "I am trying to accomplish the goal of getting my associates degree" - is 10 credits away from graduation.  Has long term disability through her company - gets monthly payment.  Will last until 2036.  Has applied for SSI and SSDI, has been denied for both.  Long term disability agency has appealed denials.  Per patient, reviewers have all the records needed. Advised patient to talk w SSA to ensure that they have records from [facility].  Patient has gotten help w rent from [employer] and has just gotten Physicist, medical.  Has paid for medications out of pocket.  Has PCP w [facility].  Rent, utility bills, car insurance, cost of transportation take most of her check.    What is your understanding of where you are with your cancer? Its cause?  Your treatment plan and what happens next?  Finished w treatment, in survivorship/surveillance.  Dealing with treatment side effects including vertigo and brittle teeth.  Cannot return to work at this time.  On disability  CSW  Summary:  Patient and family psychosocial functioning including strengths, limitations, and coping skills:  Diagnosed with parotid cancer June 2019.  Over past 1.5 has been treated w radiation.  Has lost hearing in left ear, has vertigo, diagnosed with acute vertigo, is falling due to lack of balance (6 months ago fell and hurt right wrist, fell and hurt leg one month ago).  [place] again in late December.  Was seen in [place] by orthopedic specialists, has knee brace to reduce swelling.  "Was seeking therapy, she was setting up a plan with the center at [facility]."  Now needs to find someone else to "sit down and talk with."  Agreeable to referral to [facility] Counseling Intern for short term support.  Aware that this servce can last through April 2021.    Wants to finish Associates Degree but feels "I can never use it because I have vertigo, fall, cant hear."  Seems futile to continue to work towards this degree; however, has significant amount invested in this and wants to better herself and her situation  Working on making meaning as a cancer survivor.  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  Used to be outgoing and engaged person who volunteered in [place].  Was able to pay her bills and live comfortably - now facing financial stress.  Needs hearing aid, but cannot afford the significant cost.  Teeth have become brittle due to radiation and she needs dental work she cannot afford.  Struggling to find meaningful work that is consistent with her current physical limitations.  Identifications of barriers to care:  Financial strain, cannot afford care she needs, switching PCP due to change in insurance network - needs to reestablish w [facility] Increasingly isolated due to COVID and side effects of treatment.   Availability of community resources:   Will initially refer to [facility] Counseling Intern for support and Rhina Brackett ([place] of Hearing specialist) for any available help w obtaining hearing aid.  Advised to ask at [facility] for help w obtaining [facility] Card which  May help with dental care needs.    Clinical Social Worker follow up needed: Yes.  Ensure referrals are made.  Patient will have my contact information and can call as needed.    Edwyna Shell, LCSW Clinical Social Worker Phone:  [phone] Cell:  [phone]

## 2019-03-12 ENCOUNTER — Encounter: Payer: Self-pay | Admitting: Radiation Oncology

## 2019-03-12 NOTE — Progress Notes (Signed)
Left patient voicemail today to inform about free counseling services at [facility] after a referral from [employer], LCSW.  Counseling Intern encouraged pt to return the call for more information and to schedule a one-on-one phone counseling call. Counseling intern will follow-up with the patient next week.  Shelley Mason Counseling Intern Voicemail: [phone]

## 2019-03-15 NOTE — Progress Notes (Signed)
Spoke to patient via phone on 03/15/19 about the free available counseling service at [facility]. Pt agreed to schedule an intake counseling appointment with the counseling intern on Thursday, March 18, 2019 at 3:00pm via phone.   Art [facility] [place] Counseling Intern Voicemail:  [phone]

## 2019-03-18 NOTE — Progress Notes (Signed)
Spoke with patient on 03/18/19 via phone for an intake counseling appointment. Counseling intern provided space for pt to discuss her current challenges and responded with empathy and compassion. Pt stated that she is now cancer free, but has many other health complications that have resulted in her experiencing constant anxiety. Pt reports that her hearing loss in one ear makes her feel like she needs to be on guard at every moment and that she can not engage socially with people because she can not hear them well enough. Pt reported that she was previously "very social" and this is abnormal for her. Pt stated she also suffers from acute vertigo, which has caused several falls and makes her more anxious about driving, moving around, and being out in public. Pt also stated she has financial problems which add to her anxiety. Pt and counselor agreed to work on a tx plan that will aim to decrease the pt's anxiety level through the use of coping strategies.   Next Counseling Appt: Wednesday 03/24/19 at 2:00pm via phone  Art Buff [facility] Counseling Intern Voicemail:  [phone]

## 2019-03-24 NOTE — Progress Notes (Signed)
Spoke to patient on 03/24/19 for a counseling session via phone. Pt and counseling intern discussed patient's anxiety and the strategies she uses to decrease anxiety. Pt stated that for the past month she only sleeps a few hours each night. Counseling intern emphasized the need for her to get more sleep to increase the pt's mental and physical wellbeing. Pt stated she has intrusive thoughts during the night and that she can not find any strategies which help. Counseling intern recommended the pt work on identifying the anxiety as soon as she first feels it coming on because it will be easier to control at that point.  Pt described many coping strategies and relaxation strategies that she uses (positive self-talk, distraction, prayer, listening to music, sitting by her fireplace or aquarium). Counseling intern suggested the following coping strategies to use when pt identifies she is anxious:  - Slow breathing - Grounding exercises to bring her focus to the present moment and away from her worries - Noticing negative thoughts and replacing them with positive or helpful thoughts - Use positive and supportive self-talk to increase self-compassion   Pt and counseling intern discussed the importance of continuing to utilize the coping and relaxation strategies. Counseling intern will e-mail the pt a treatment plan with suggested objectives and interventions. Pt agreed to read over the plan and try out a few of the interventions this week.   Next Counseling Appt: Wednesday, March 31, 2019 at 1:30pm via phone  Art Buff [facility] Counseling Intern Voicemail:  [phone]

## 2019-03-26 ENCOUNTER — Telehealth: Payer: Self-pay | Admitting: *Deleted

## 2019-03-26 NOTE — Telephone Encounter (Signed)
Oncology Nurse Navigator Documentation  In follow-up to 1/5 appt with Dr. Isidore Moos, called Shelley Mason to address he reporting of continuing vertigo. I explained recommendation for Shelley Mason test which she can schedule with Jolene Provost, PA, [facility]. She indicated she had test at [facility] last October and it was recommended she be fitted with hearing aids.  She declined HA consultation b/c of the $200 down payment required.  She further noted she cannot afford the $1,000 plus cost of hearing aids. I indicated I would call [facility] and talk with PA on her behalf, share our conversation.  She voiced appreciation.  1239:  LVMM for PA [place] requesting return call.   [name], RN, BSN Head & Neck Oncology Nurse [place] at [place] [phone]

## 2019-04-08 NOTE — Progress Notes (Signed)
Spoke to patient on 04/07/19 via phone for a counseling session.Counseling intern provided space for pt to talk about her challenges and provided empathy, compassion, positive support, and normalization of feelings. Pt stated she is doing better this week partly because she is now able to accept certain things that are out of her control. Pt reported working on using some of the interventions as outlined on her tx plan. Pt agreed to continue working on these goals and to inform CI of any concerns or additions. Pt reported that she relies on distraction, relaxation strategies, and positive self-talk to decrease anxiety. Patient's boyfriend joined the 2nd half of the call per patient's request. Pt and counseling intern discussed with him the intention of her tx plan and how he can provide support to help her succeed. Boyfriend stated he understands and agreed to help as he can. Pt and CI discussed the challenge of depending on others for support and normalized the need to sometimes lean on others in her life.    Next Counseling Appt: Wednesday, April 16, 2019 at 2:15pm via phone  [place] Counseling Intern Voicemail:  [phone]

## 2019-04-21 NOTE — Progress Notes (Signed)
Spoke to patient on 04/21/19 via phone for a counseling session. Pt stated she is doing "OK" and that she is having a "good week" as evidenced by her ability to adhere to her routine, her ability to focus, and her ability to utilize her coping strategies (breathing exercises, distraction, calming music, positive self-talk) when needed. Pt stated she now recognizes when she has no control in a situation and she no longer lets these situations bother her. 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 She reported that it felt good to see students again, even virtually and that being with the kids gave her a sense of purpose. Pt stated she is contemplating ending a relationship that she does not feel good about. Pt stated she values open, honest communication and needs to know that her friend is "on her team". Pt reported that she is intentionally trying to go on more outings to desensitize herself to the anxiety she feels when out because of her hearing loss. Pt ended our session by reporting that she is no longer afraid of change, but now realizes that it is actually when she feels stuck and unable to change or adapt that she feels anxious, afraid, and overwhelmed. Pt stated she clearly sees her goals in life (to feel emotionally stable, to complete her degree, and to find a teaching job) and she is working to remove any barriers and obstacles to achieving these goasl that she can.   Next Counseling Session: Wednesday, April 28, 2019 at 2:30pm via phone  [place] Counseling Intern Voicemail:  [phone]

## 2019-04-23 ENCOUNTER — Other Ambulatory Visit: Payer: Self-pay

## 2019-04-23 ENCOUNTER — Ambulatory Visit (INDEPENDENT_AMBULATORY_CARE_PROVIDER_SITE_OTHER): Payer: 59 | Admitting: Family Medicine

## 2019-04-23 ENCOUNTER — Encounter: Payer: Self-pay | Admitting: Family Medicine

## 2019-04-23 ENCOUNTER — Encounter: Payer: Self-pay | Admitting: Gastroenterology

## 2019-04-23 VITALS — BP 128/74 | HR 98 | Wt 210.4 lb

## 2019-04-23 DIAGNOSIS — Z114 Encounter for screening for human immunodeficiency virus [HIV]: Secondary | ICD-10-CM

## 2019-04-23 DIAGNOSIS — C07 Malignant neoplasm of parotid gland: Secondary | ICD-10-CM

## 2019-04-23 DIAGNOSIS — E669 Obesity, unspecified: Secondary | ICD-10-CM

## 2019-04-23 DIAGNOSIS — R1310 Dysphagia, unspecified: Secondary | ICD-10-CM | POA: Diagnosis not present

## 2019-04-23 DIAGNOSIS — F329 Major depressive disorder, single episode, unspecified: Secondary | ICD-10-CM

## 2019-04-23 DIAGNOSIS — F17210 Nicotine dependence, cigarettes, uncomplicated: Secondary | ICD-10-CM

## 2019-04-23 DIAGNOSIS — R300 Dysuria: Secondary | ICD-10-CM

## 2019-04-23 DIAGNOSIS — Z1159 Encounter for screening for other viral diseases: Secondary | ICD-10-CM | POA: Diagnosis not present

## 2019-04-23 DIAGNOSIS — K219 Gastro-esophageal reflux disease without esophagitis: Secondary | ICD-10-CM | POA: Insufficient documentation

## 2019-04-23 DIAGNOSIS — F32A Depression, unspecified: Secondary | ICD-10-CM

## 2019-04-23 DIAGNOSIS — R1319 Other dysphagia: Secondary | ICD-10-CM

## 2019-04-23 LAB — POCT URINALYSIS DIP (MANUAL ENTRY)
Bilirubin, UA: NEGATIVE
Glucose, UA: NEGATIVE mg/dL
Ketones, POC UA: NEGATIVE mg/dL
Leukocytes, UA: NEGATIVE
Nitrite, UA: POSITIVE — AB
Protein Ur, POC: NEGATIVE mg/dL
Spec Grav, UA: 1.025
Urobilinogen, UA: 0.2 U/dL
pH, UA: 6

## 2019-04-23 LAB — POCT UA - MICROSCOPIC ONLY

## 2019-04-23 MED ORDER — BUPROPION HCL ER (SR) 150 MG PO TB12: ORAL_TABLET | ORAL | 3 refills | Status: DC

## 2019-04-23 MED ORDER — OMEPRAZOLE 40 MG PO CPDR: 40.0000 mg | DELAYED_RELEASE_CAPSULE | Freq: Every day | ORAL | 3 refills | Status: DC

## 2019-04-23 NOTE — Assessment & Plan Note (Signed)
Discussed at length. Already has therapist. PHQ9 is 21. Currently on no medications. Restart Welbutrin. Follow up in 2 weeks. Therapy and suicide hotlne given.

## 2019-04-23 NOTE — Assessment & Plan Note (Signed)
Suspect acid reflux. No symptoms suggestive of ACS (no pain with exertion, dyspnea). Dysphagia possibly due to poor mastication. Given age and associated symptoms, will refer to GI for possible EGD. Patient reports she had a normal colonoscopy in 2020.

## 2019-04-23 NOTE — Progress Notes (Signed)
Subjective  Shelley Mason is a 54 y.o. female is presenting with the following: mood and dysuria.   Shelley Mason presents today with a number of items.  She reports worsening 'reflux'. Has had intermittent reflux for year. Burning, postprandial epigastric pain. Worse with laying down. No chest pressure or pressure with exertion. No dyspnea. Intermittent cough. Does feel like food gets 'stuck'.  Patient denies odynophagia but has dysphagia with solids. + Dry mouth. Has poor dentition and needs multiple teeth removed. No weight loss, melena, hematochezia.   In terms of depression, patient reports due to inability to work, she is feeling low. She has persistent left sided tinnitus and vertigo. Limits her ability to focus (currently in class) and drive. Has difficulty falling and staying asleep. Denies thoughts of hurting herself/others. Support is her long time boyfriend. COVID has been difficult due to limited interactions in person.   Patient endorses a several week history of dysuria. Has urgency. No frequency, back pain, fevers, or abdominal pain.   SHX; She is smoking 5-6 cigarettes per day, interested in quitting. 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   Objective Vital Signs reviewed BP 128/74   Pulse 98   Wt 210 lb 6.4 oz (95.4 kg)   SpO2 99%   BMI 35.01 kg/m   HEENT: Sclera anicteric. Dentition is poor, symmetric face, left ear with slit of lobule . Appears well hydrated. Cardiac: Regular rate and rhythm. Normal S1/S2. No murmurs, rubs, or gallops appreciated. Lungs: Clear bilaterally to ascultation.  Abdomen: Normoactive bowel sounds. No tenderness to deep or light palpation. No rebound or guarding.  Skin: Warm, dry Psych: Pleasant and appropriate     Assessments/Plans  Depression Discussed at length. Already has therapist. PHQ9 is 21. Currently on no medications. Restart Welbutrin. Follow up in 2 weeks. Therapy and suicide hotlne given.    Cigarette smoker Encouraged cessation, previously quit. Started Welbutrin.   Gastroesophageal reflux disease Suspect acid reflux. No symptoms suggestive of ACS (no pain with exertion, dyspnea). Dysphagia possibly due to poor mastication. Given age and associated symptoms, will refer to GI for possible EGD. Patient reports she had a normal colonoscopy in 2020.  CBC and CMET today to evaluate other causes of epigastric pain.   Dysuria, moderate suspicion for UTI. UA positive for nitrites, will plan to treat. No microscopic hematuria (occasional RBC).   HCM Records for colonoscopy   At end of visit patient mentioned a fall several weeks ago, no injury. Consider PT/Vestibular rehab at followup.    See after visit summary for details of patient instructions  Shelley Singh, MD  [facility] Medicine Teaching Service

## 2019-04-23 NOTE — Assessment & Plan Note (Signed)
Encouraged cessation, previously quit. Started Welbutrin.

## 2019-04-23 NOTE — Patient Instructions (Addendum)
It was wonderful to see you today.  Thank you for choosing [facility].   Please call [phone] with any questions about today's appointment.  Please be sure to schedule follow up at the front  desk before you leave today.   Dorris Singh, MD  Family Medicine     1. For your mood and depression--- Start Bupropion once a day for 3 days then increase to twice a day  2. You will be called by the Gastroenterologists  3. I will call you with your urine results.   I will call you on [facility] on March 8th about your medication     If you are feeling suicidal or depression symptoms worsen please immediately go to:   24 Hour Availability [facility]  [address] [place], [place] [address]  [phone] or [phone]  . If you are thinking about harming yourself or having thoughts of suicide, or if you know someone who is, seek help right away. . Call your doctor or mental health care provider. . Call 911 or go to a hospital emergency room to get immediate help, or ask a friend or family member to help you do these things. . Call the [place] National Suicide Prevention Lifeline's toll-free, 24-hour hotline at 1-800-273-TALK 805 275 5349) or TTY: 1-800-799-4 TTY 435-460-4859) to talk to a trained counselor. . If you are in crisis, make sure you are not left alone.  . If someone else is in crisis, make sure he or she is not left alone   Family [facility] of the [employer] (Domestic Violence, Rape & Victim Assistance [phone]  [facility]  [address], [place]  [address]               [phone] or [phone]  [place]    (ONLY from 8am-4pm)    [phone]  [facility] (24/7)   [phone]  [place] National Suicide Hotline   [phone] Diamantina Monks)

## 2019-04-24 LAB — COMPREHENSIVE METABOLIC PANEL
ALT: 10 IU/L (ref 0–32)
AST: 14 IU/L (ref 0–40)
Albumin/Globulin Ratio: 1.3 (ref 1.2–2.2)
Albumin: 4.1 g/dL (ref 3.8–4.9)
Alkaline Phosphatase: 84 IU/L (ref 39–117)
BUN/Creatinine Ratio: 11 (ref 9–23)
BUN: 9 mg/dL (ref 6–24)
Bilirubin Total: <0.2 mg/dL (ref 0.0–1.2)
CO2: 20 mmol/L (ref 20–29)
Calcium: 9.1 mg/dL (ref 8.7–10.2)
Chloride: 108 mmol/L — ABNORMAL HIGH (ref 96–106)
Creatinine, Ser: 0.83 mg/dL (ref 0.57–1.00)
GFR calc Af Amer: 94 mL/min/{1.73_m2} (ref >59)
GFR calc non Af Amer: 82 mL/min/{1.73_m2} (ref >59)
Globulin, Total: 3.1 g/dL (ref 1.5–4.5)
Glucose: 82 mg/dL (ref 65–99)
Potassium: 4.8 mmol/L (ref 3.5–5.2)
Sodium: 144 mmol/L (ref 134–144)
Total Protein: 7.2 g/dL (ref 6.0–8.5)

## 2019-04-24 LAB — CBC

## 2019-04-24 LAB — HEPATITIS C ANTIBODY (REFLEX): HCV Ab: <0.1 s/co ratio (ref 0.0–0.9)

## 2019-04-24 LAB — HCV COMMENT:

## 2019-04-26 ENCOUNTER — Telehealth: Payer: Self-pay | Admitting: Family Medicine

## 2019-04-26 DIAGNOSIS — N3000 Acute cystitis without hematuria: Secondary | ICD-10-CM

## 2019-04-26 MED ORDER — NITROFURANTOIN MONOHYD MACRO 100 MG PO CAPS: 100.0000 mg | ORAL_CAPSULE | Freq: Two times a day (BID) | ORAL | 0 refills | Status: AC

## 2019-04-26 NOTE — Telephone Encounter (Signed)
Called with results,normal.   UA positive for nitrites, culture not sent. Rx for macrobid to pharmacy (PCN allergic). Reviewed side effects. All questions answered.   Dorris Singh, MD  Family Medicine Teaching Service

## 2019-04-28 NOTE — Progress Notes (Signed)
Spoke to pt on 04/28/2019 for a telehealth counseling session via phone. Counseling intern provided space for pt to open up about experiences and responded with empathy, compassion, and normalization of feelings. Pt reported she has had a "really good" week so far as evidenced by her calm mood and ability to handle stress. Pt reported she started taking Buproprion (40mg ) this week and that it has helped stabilize her mood and eliminated her anxiety.Pt stated she is planning to stop smoking and the CI explained that research demonstrates Buproprion will help with this effort. Pt and CI discussed process of smoking cessation and the withdrawal symptoms she might experience. Pt stated she has a goal to stop smoking by June 01, 2019 and said she plans to slowly begin decreasing her frequency of smoking in preparation. CI is emailing the pt a link to information about the virtual Smoking Cessation program at [facility]. Pt stated she is interested in signing up. Additionally, CI suggested looking into using a NRT and will look for any information about obtaining the nicotine patch to help decrease cravings during the first month or two. CI and pt discussed the importance of having substitute behaviors to use in the place of a cigarette when a craving strikes: 1) Hold a straw to hold in her hand to mimic the feeling of holding a cigarette 2) Replay her grandson's voice in her head telling her "it's a nasty habit" and that "I don't want you to smoke grandma"  3) Support person (her sister).   Next Counseling Session: Wednesday, May 05, 2019 at 1:00pm via phone  Art Buff [facility] Counseling Intern Voicemail:  [phone]

## 2019-05-03 ENCOUNTER — Encounter: Payer: Self-pay | Admitting: Family Medicine

## 2019-05-05 NOTE — Progress Notes (Signed)
Spoke to pt on 05/05/2019 for a telehealth counseling session via phone. Counseling intern provided space for pt to open up about experiences and responded with empathy, compassion, and normalization of feelings. Pt reported she is exhausted and CI noted the pt's voice sounded tired. Pt reported the exhaustion is the result of little sleep (just a few hours a night) because of staying up to complete assignments and being unable to sleep because of the stress. CI and pt discussed how stress effects our mood, ability to focus, appetite, and sleep. CI reminded pt she can try the coping strategies that she learned in our previous counseling sessions including square breathing and the leaves on a stream meditation to decrease her anxiety and increase sleep and focus. Pt agreed to try the strategies again this week. Pt reported that she also uses her strategy of walking away from her computer when she feels overwhelmed and breaks her tasks up into smaller chunks to help her feel less overwhelmed. Pt recognized her need for more self-care and stated she plans to try to eat more this afternoon to provide her body with energy and also that she plans to take the weekend off from school work to rest and recooperate. Pt also stated she plans to call her doctor to ask about medication to help her sleep. CI and pt did not have a chance to discuss the pt's plans to quit smoking, but CI will follow-up with pt about this next week.   Next Scheduled Counseling Session: Wednesday, May 12, 2019 at 1:00pm via phone  Art Buff [facility] Counseling Intern Voicemail:  [phone]

## 2019-05-10 ENCOUNTER — Encounter: Payer: Self-pay | Admitting: Family Medicine

## 2019-05-10 ENCOUNTER — Telehealth (INDEPENDENT_AMBULATORY_CARE_PROVIDER_SITE_OTHER): Payer: 59 | Admitting: Family Medicine

## 2019-05-10 ENCOUNTER — Other Ambulatory Visit: Payer: Self-pay

## 2019-05-10 DIAGNOSIS — F17210 Nicotine dependence, cigarettes, uncomplicated: Secondary | ICD-10-CM | POA: Diagnosis not present

## 2019-05-10 DIAGNOSIS — R1310 Dysphagia, unspecified: Secondary | ICD-10-CM

## 2019-05-10 DIAGNOSIS — K219 Gastro-esophageal reflux disease without esophagitis: Secondary | ICD-10-CM | POA: Diagnosis not present

## 2019-05-10 DIAGNOSIS — R1319 Other dysphagia: Secondary | ICD-10-CM

## 2019-05-10 MED ORDER — OMEPRAZOLE 40 MG PO CPDR: 40.0000 mg | DELAYED_RELEASE_CAPSULE | Freq: Every day | ORAL | 3 refills | Status: DC

## 2019-05-10 NOTE — Progress Notes (Signed)
[place] Telemedicine Visit  Patient consented to have virtual visit. Method of visit: Telephone  Encounter participants: Patient: MONIKA BORROWMAN - located at [facility] Provider: Martyn Malay - located at home Others (if applicable): none  Chief Complaint: follow up on smoking cessation, started bupropion   HPI: SAMMARA WAREHAM is a pleasant 54 year old with history of parotid gland carcinoma, obesity, and tobacco abuse presenting today for follow up.   Patient reports overall doing okay---classes are still stressful but she is managing.   Ms. Stiegler has started bupropion, up to 2 tabs each day. Reports improved mood and anxiety. Sleep is still a bit of an issue, primarily dependent upon stress at school. Denies SI/HI.   Ms. Vansant is down to 1 cigarette per day.  She is set a quit date for 31 March.  She reports her primary triggers stress at school.  Ms. Leite has a appointment with gastroenterology on 19 March.  She reports her reflux symptoms are about the same.  She has not picked up the omeprazole yet.  No new symptoms since last visit.   ROS: per HPI  Pertinent PMHx: As above.   Exam:  Respiratory: Speaking in full sentences.   Assessment/Plan:  Cigarette smoker Offered additional therapy with gum, discussed strategies to quit. Follow upon 3/31 to support for quit day.   Gastroesophageal reflux disease New Rx to pharmacy, reviewed reasons to call and return to care.    History of falls- none since last visit.   Follow up 3/31 to check smoking status.   Time spent during visit with patient: 13 minutes

## 2019-05-10 NOTE — Assessment & Plan Note (Signed)
New Rx to pharmacy, reviewed reasons to call and return to care.

## 2019-05-10 NOTE — Assessment & Plan Note (Signed)
Offered additional therapy with gum, discussed strategies to quit. Follow upon 3/31 to support for quit day.

## 2019-05-21 ENCOUNTER — Other Ambulatory Visit: Payer: Self-pay

## 2019-05-21 ENCOUNTER — Ambulatory Visit (INDEPENDENT_AMBULATORY_CARE_PROVIDER_SITE_OTHER): Payer: 59 | Admitting: Gastroenterology

## 2019-05-21 ENCOUNTER — Encounter: Payer: Self-pay | Admitting: Gastroenterology

## 2019-05-21 VITALS — BP 106/66 | HR 86 | Temp 98.1°F | Ht 65.0 in | Wt 212.4 lb

## 2019-05-21 DIAGNOSIS — R1312 Dysphagia, oropharyngeal phase: Secondary | ICD-10-CM

## 2019-05-21 DIAGNOSIS — Z01818 Encounter for other preprocedural examination: Secondary | ICD-10-CM | POA: Diagnosis not present

## 2019-05-21 DIAGNOSIS — K219 Gastro-esophageal reflux disease without esophagitis: Secondary | ICD-10-CM | POA: Diagnosis not present

## 2019-05-21 NOTE — Progress Notes (Signed)
[facility] Consult Note:  History: Shelley Mason 05/21/2019  Referring provider: Martyn Malay, MD  Reason for consult/chief complaint: Dysphagia, Gastroesophageal Reflux, and Cancer partoid gland (left side, radiation treatment in 2019)   Subjective  HPI:  This is a pleasant 54 year old woman referred by primary care for chronic heartburn.  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  She also has intermittent cough, and says when she gets bouts of that she might have regurgitation.  Sometimes drinking water just causes heartburn and she wonders why this occurs despite taking medication.  Charmon tells me she understands which foods she should avoid for reflux, but still has persistent heartburn.  She underwent screening colonoscopy at [facility] last year and says it was normal (no report available), did not undergo upper endoscopy at that time.  She was not able to go back to that provider since that network did not except her insurance.  For that same reason, she also needs a new ENT physician for her chronic vertigo.  She frequently suffers from vertigo including earlier this week when she cannot get out of bed for a whole day.  She has some difficulty chewing and swallowing because of poor dentition from the radiation, says she needs to chew on the right side of the mouth, has lost multiple teeth needs to see an oral surgeon but cannot afford to do so.  The radiation also cause decreased saliva, so she has to drink extra water to make sure food goes down well.  With that, she does not sense food feeling stuck in the neck or chest.   ROS:  Review of Systems  Constitutional: Negative for appetite change and unexpected weight change.  HENT: Negative for mouth sores and voice change.   Eyes: Negative for pain and redness.  Respiratory: Positive for  cough. Negative for shortness of breath.   Cardiovascular: Negative for chest pain and palpitations.  Genitourinary: Negative for dysuria and hematuria.  Musculoskeletal: Negative for arthralgias and myalgias.  Skin: Negative for pallor and rash.  Neurological: Negative for weakness and headaches.       Vertigo  Hematological: Negative for adenopathy.  Psychiatric/Behavioral:       Mood stable on Wellbutrin     Past Medical History: Past Medical History:  Diagnosis Date  . Anemia    history of  . Cigarette smoker   . Fibroids    history of uterine fibroids  . Hearing loss   . Hearing loss   . History of radiation therapy 09/22/17- 11/06/17   tumor bed left neck with added margin o facial nerve.   . Obstructive sleep apnea   . Primary cancer of parotid gland ([place])   . 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 Vertigo      Past Surgical History: Past Surgical History:  Procedure Laterality Date  . CESAREAN SECTION     x 3  . CYSTOSCOPY    . LAPAROSCOPIC ASSISTED VAGINAL HYSTERECTOMY    . PAROTIDECTOMY Left   . TUBAL LIGATION       Family History: Family History  Problem Relation Age of Onset  . Hyperlipidemia Mother   . Hypertension Mother   . Heart failure Mother   . Depression Mother   . Alcohol abuse Mother   . Prostate cancer Father   . Alcohol abuse Father   . Hyperlipidemia Sister   .  Heart disease Other        family history  . Diabetes Other        family history  . Hypertension Other        family history    Social History: Social History   Socioeconomic History  . Marital status: Married    Spouse name: Not on file  . Number of children: 3  . Years of education: Not on file  . Highest education level: Not on file  Occupational History    Employer: [employer]  Tobacco Use  . Smoking status: Current Every Day Smoker    Packs/day: 1.00    Types: Cigarettes    Start date: 06/20/2000  . Smokeless tobacco: Never Used  . Tobacco comment: She had  quit, but restarted due to stress.   Substance and Sexual Activity  . Alcohol use: Not Currently    Alcohol/week: 0.0 standard drinks    Comment: occasionally  . Drug use: No  . Sexual activity: Yes    Birth control/protection: None, Surgical  Other Topics Concern  . 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      If she couldn't take --> Nettie Bullard would make decisions       Smokes cigarettes    Social Determinants of Health   Financial Resource Strain:   . Difficulty of Paying Living Expenses:   Food Insecurity:   . Worried About Charity fundraiser in the Last Year:   . Arboriculturist in the Last Year:   Transportation Needs:   . Film/video editor (Medical):   Marland Kitchen Lack of Transportation (Non-Medical):   Physical Activity:   . Days of Exercise per Week:   . Minutes of Exercise per Session:   Stress:   . Feeling of Stress :   Social Connections:   . Frequency of Communication with Friends and Family:   . Frequency of Social Gatherings with Friends and Family:   . Attends Religious Services:   . Active Member of Clubs or Organizations:   . Attends Archivist Meetings:   Marland Kitchen Marital Status:     Allergies: Allergies  Allergen Reactions  . Penicillins Anaphylaxis and Rash    Has patient had a PCN reaction causing immediate rash, facial/tongue/throat swelling, SOB or lightheadedness with hypotension: Yes Has patient had a PCN reaction causing severe rash involving mucus membranes or skin necrosis: Yes Has patient had a PCN reaction that required hospitalization: Yes Has patient had a PCN reaction occurring within the last 10 years: No If all of the above answers are "NO", then may proceed with Cephalosporin use.   . Codeine Rash    Outpatient Meds: Current Outpatient Medications  Medication Sig Dispense Refill  . buPROPion (WELLBUTRIN SR) 150 MG 12 hr tablet Take  1 tablet for 3 days then increase to 1 tablet twice a day 180 tablet 3  . meclizine (ANTIVERT) 25 MG tablet Take 1 tablet (25 mg total) by mouth 3 (three) times daily as needed for dizziness. 30 tablet 0  . omeprazole (PRILOSEC) 40 MG capsule Take 1 capsule (40 mg total) by mouth daily. 30 capsule 3  . sodium fluoride (PREVIDENT 5000 PLUS) 1.1 % CREA dental cream Apply cream to tooth brush. Brush teeth for 2 minutes. Spit out excess. DO NOT rinse afterwards. Repeat nightly. 1 Tube PRN  No current facility-administered medications for this visit.      ___________________________________________________________________ Objective   Exam:  BP 106/66   Pulse 86   Temp 98.1 F (36.7 C)   Ht 5\' 5"  (1.651 m)   Wt 212 lb 6.4 oz (96.3 kg)   SpO2 99%   BMI 35.35 kg/m    General: Well-appearing, normal vocal quality  Eyes: sclera anicteric, no redness  ENT: oral mucosa moist without lesions, no cervical or supraclavicular lymphadenopathy.  Poor bottom dentition  CV: RRR without murmur, S1/S2, no JVD, no peripheral edema  Resp: clear to auscultation bilaterally, normal RR and effort noted  GI: soft, no tenderness, with active bowel sounds. No guarding or palpable organomegaly noted.  Skin; warm and dry, no rash or jaundice noted  Neuro: awake, alert and oriented x 3. Normal gross motor function and fluent speech  Labs:  CBC Latest Ref Rng & Units 04/23/2019 10/13/2017 04/18/2017  WBC x10E3/uL CANCELED 7.3 10.7  Hemoglobin - CANCELED 12.7 13.0  Hematocrit - CANCELED 38.2 38.7  Platelets - CANCELED 292 335   CMP Latest Ref Rng & Units 04/23/2019 10/13/2017 04/18/2017  Glucose 65 - 99 mg/dL 82 96 86  BUN 6 - 24 mg/dL 9 13 19   Creatinine 0.57 - 1.00 mg/dL 0.83 0.71 0.81  Sodium 134 - 144 mmol/L 144 140 139  Potassium 3.5 - 5.2 mmol/L 4.8 4.5 4.1  Chloride 96 - 106 mmol/L 108(H) 110 104  CO2 20 - 29 mmol/L 20 23 19(L)  Calcium 8.7 - 10.2 mg/dL 9.1 8.9 9.0  Total Protein 6.0 - 8.5  g/dL 7.2 6.9 -  Total Bilirubin 0.0 - 1.2 mg/dL <0.2 1.0 -  Alkaline Phos 39 - 117 IU/L 84 52 -  AST 0 - 40 IU/L 14 23 -  ALT 0 - 32 IU/L 10 13 -     Assessment: Encounter Diagnoses  Name Primary?  . Gastroesophageal reflux disease, unspecified whether esophagitis present Yes  . Oropharyngeal dysphagia   . Preprocedural examination     Heartburn since her radiation therapy for parotid gland cancer in 2019.  It is improved with PPI but symptoms persist.  It is most likely from reflux, as she also has some intermittent regurgitation, but sometimes there is an element of functional heartburn as well (especially if occurs drinking water).  Radiation therapy can cause esophageal hypersensitivity and/or dysmotility, but that seems less likely with focused radiation to a parotid cancer versus radiation to the chest cavity.  Plan:  Continue current medicines. Diet and lifestyle measures for reflux, smaller portions, avoid late evening meals, weight loss  I recommended upper endoscopy and she is agreeable after discussion of procedure and risks.  The benefits and risks of the planned procedure were described in detail with the patient or (when appropriate) their health care proxy.  Risks were outlined as including, but not limited to, bleeding, infection, perforation, adverse medication reaction leading to cardiac or pulmonary decompensation, pancreatitis (if ERCP).  The limitation of incomplete mucosal visualization was also discussed.  No guarantees or warranties were given.   Thank you for the courtesy of this consult.  Please call me with any questions or concerns.  Nelida Meuse III  CC: Referring provider noted above

## 2019-05-21 NOTE — Progress Notes (Signed)
Counseling intern called patient on 05/21/19 to check-in and ask about scheduling a counseling session. Pt stated she would like to schedule a telehealth counseling session next week. CI scheduled pt for a counseling session via phone on Monday, 05/24/19 at 4:00pm.   [facility] Counseling Intern Voicemail:  [phone]

## 2019-05-21 NOTE — Patient Instructions (Signed)
If you are age 52 or older, your body mass index should be between 23-30. Your Body mass index is 35.35 kg/m. If this is out of the aforementioned range listed, please consider follow up with your Primary Care Provider.  If you are age 46 or younger, your body mass index should be between 19-25. Your Body mass index is 35.35 kg/m. If this is out of the aformentioned range listed, please consider follow up with your Primary Care Provider.   You have been scheduled for an endoscopy. Please follow written instructions given to you at your visit today. If you use inhalers (even only as needed), please bring them with you on the day of your procedure. Your physician has requested that you go to www.startemmi.com and enter the access code given to you at your visit today. This web site gives a general overview about your procedure. However, you should still follow specific instructions given to you by our office regarding your preparation for the procedure.  It was a pleasure to see you today!  Dr. Loletha Carrow

## 2019-05-24 ENCOUNTER — Telehealth: Payer: Self-pay

## 2019-05-24 DIAGNOSIS — C07 Malignant neoplasm of parotid gland: Secondary | ICD-10-CM

## 2019-05-24 NOTE — Telephone Encounter (Signed)
Informed patient of referral.  .Shelley Mason, [place]

## 2019-05-24 NOTE — Telephone Encounter (Signed)
Patient calls nurse line stating her insurance has changed again and needs a new ENT referral placed by PCP. Patient would like to go to Dr. Melony Overly.

## 2019-05-24 NOTE — Telephone Encounter (Signed)
Referral to ENT placed. Please let patient know.   Dorris Singh, MD  Family Medicine Teaching Service

## 2019-05-25 NOTE — Progress Notes (Signed)
Counseling intern Spoke to pt on 2021 for a telehealth counseling session via phone. Counseling intern provided space for pt to open up about experiences and responded with empathy, compassion, and normalization of feelings. Pt presented to counseling in a calm mood and demeanor.  Pt reported experiencing acute vertigo (dizziness, loss of balance, feeling nauseated) or the past week which has resulted in her doctor recommending she be on bedrest. Pt also reported having trouble with short term memory at times during this experience with vertigo. Pt  Recognized she is able to use the skills she has learned to keep herself calm and stated that she has also learned to recognize when she is starting to feel anxious or distressed. Pt finds it helpful to redirect her attention when feeling anxious by visualizing a peaceful place such as a park, read something she enjoys, sit by her fish tank, or listening to jazz music.   CI discussed self-compassion as a way to decrease depressive feelings and anxiety about pt's current expereince with vertigo. CI encouraged pt to continue using the grounding techniques to remain in the present moment despite distressing symptoms.   [facility] Counseling Intern Voicemail:  [phone]

## 2019-05-31 NOTE — Progress Notes (Signed)
Counseling intern called patient for a scheduled counseling session on 05/31/19. However, pt stated she was unable to talk at that time because she had just fallen and hurt her hip. Pt reported she was OK, but needed to deal with the situation. CI will call pt another time to reschedule.  [facility] Counseling Intern Voicemail:  [phone]

## 2019-06-02 ENCOUNTER — Encounter: Payer: Self-pay | Admitting: Family Medicine

## 2019-06-02 ENCOUNTER — Telehealth: Payer: Self-pay | Admitting: Family Medicine

## 2019-06-02 NOTE — Telephone Encounter (Signed)
Called patient about smoking cessation---still thinking about quitting.   Has follow up with ENT.   Reports she needs letter for school as she cannot drive with current symptoms.Letter sent.  All questions answered-  Nursing--please reach out to patient and help to schedule appointment in May with me.    Dorris Singh, MD  Family Medicine Teaching Service

## 2019-06-03 ENCOUNTER — Encounter (INDEPENDENT_AMBULATORY_CARE_PROVIDER_SITE_OTHER): Payer: Self-pay | Admitting: Otolaryngology

## 2019-06-03 ENCOUNTER — Ambulatory Visit (INDEPENDENT_AMBULATORY_CARE_PROVIDER_SITE_OTHER): Payer: 59 | Admitting: Otolaryngology

## 2019-06-03 ENCOUNTER — Other Ambulatory Visit: Payer: Self-pay

## 2019-06-03 VITALS — Temp 97.3°F

## 2019-06-03 DIAGNOSIS — H9312 Tinnitus, left ear: Secondary | ICD-10-CM | POA: Diagnosis not present

## 2019-06-03 DIAGNOSIS — H9042 Sensorineural hearing loss, unilateral, left ear, with unrestricted hearing on the contralateral side: Secondary | ICD-10-CM | POA: Diagnosis not present

## 2019-06-03 DIAGNOSIS — R42 Dizziness and giddiness: Secondary | ICD-10-CM | POA: Diagnosis not present

## 2019-06-03 NOTE — Telephone Encounter (Signed)
Called patient and she states that she needed to be seen sooner than May because she fell.  PCP was not available so patient was put with a team member.  If patient still needs an appointment with Dr. Owens Shark, I can put this in the appointment notes for Monday.  Ozella Almond, [place]

## 2019-06-03 NOTE — Progress Notes (Signed)
HPI: Shelley Mason is a 54 y.o. female who presents is referred by Dr. Owens Shark for evaluation of dizziness as well as hearing loss in the left ear and chronic tinnitus.  The dizziness apparently started this past October and has been fairly chronic.  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  She subsequently underwent postoperative radiation therapy that she completed in September 2019.  She has had several CT scans of her neck since that time with no evidence of recurrent cancer.  Her last CT scan was in December of last year.  And this was reported as no evidence of cancer.  I was unable to visualize the scans. 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  She was recommended hearing aid but never got the hearing aid for the left ear.  She complains of chronic ringing or tinnitus in the left ear. She just recently started having dizziness and vertigo that started last October. She reports that she has had a balance test as well as a hearing test and was recommended use of meclizine for her dizziness but this makes her tired. She describes chronic imbalance especially when she moves briskly and just rarely does she have vertigo.  Past Medical History:  Diagnosis Date  . Anemia    history of  . Cigarette smoker   . Fibroids    history of uterine fibroids  . Hearing loss   . Hearing loss   . History of radiation therapy 09/22/17- 11/06/17   tumor bed left neck with added margin o facial nerve.   . Obstructive sleep apnea   . Primary cancer of parotid gland ([place])   . 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 Vertigo    Past Surgical History:  Procedure Laterality Date  . CESAREAN SECTION     x 3  . CYSTOSCOPY    . LAPAROSCOPIC ASSISTED VAGINAL HYSTERECTOMY    . PAROTIDECTOMY Left   . TUBAL LIGATION      Social History   Socioeconomic History  . Marital status: Married    Spouse name: Not on file  . Number of children: 3  . Years of education: Not on file  . Highest education level: Not on file  Occupational History    Employer: [employer]  Tobacco Use  . Smoking status: Current Every Day Smoker    Packs/day: 1.00    Years: 19.00    Pack years: 19.00    Types: Cigarettes    Start date: 06/20/2000  . Smokeless tobacco: Never Used  . Tobacco comment: She had quit, but restarted due to stress.   Substance and Sexual Activity  . Alcohol use: Not Currently    Alcohol/week: 0.0 standard drinks    Comment: occasionally  . Drug use: No  . Sexual activity: Yes    Birth control/protection: None, Surgical  Other Topics Concern  . 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      If she couldn't take --> Nettie Bullard would make decisions       Smokes cigarettes    Social Determinants of Health   Financial Resource Strain:   . Difficulty of Paying Living Expenses:   Food Insecurity:   . Worried About  Running Out of Food in the Last Year:   . [place] in the Last Year:   Transportation Needs:   . Lack of Transportation (Medical):   Marland Kitchen Lack of Transportation (Non-Medical):   Physical Activity:   . Days of Exercise per Week:   . Minutes of Exercise per Session:   Stress:   . Feeling of Stress :   Social Connections:   . Frequency of Communication with Friends and Family:   . Frequency of Social Gatherings with Friends and Family:   . Attends Religious Services:   . Active Member of Clubs or Organizations:   . Attends Archivist Meetings:   Marland Kitchen Marital Status:    Family History  Problem Relation Age of Onset  . Hyperlipidemia Mother   . Hypertension Mother   . Heart failure Mother   . Depression Mother   . Alcohol abuse Mother   . Prostate cancer Father   .  Alcohol abuse Father   . Hyperlipidemia Sister   . Heart disease Other        family history  . Diabetes Other        family history  . Hypertension Other        family history   Allergies  Allergen Reactions  . Penicillins Anaphylaxis and Rash    Has patient had a PCN reaction causing immediate rash, facial/tongue/throat swelling, SOB or lightheadedness with hypotension: Yes Has patient had a PCN reaction causing severe rash involving mucus membranes or skin necrosis: Yes Has patient had a PCN reaction that required hospitalization: Yes Has patient had a PCN reaction occurring within the last 10 years: No If all of the above answers are "NO", then may proceed with Cephalosporin use.   . Codeine Rash   Prior to Admission medications   Medication Sig Start Date End Date Taking? Authorizing Provider  buPROPion (WELLBUTRIN SR) 150 MG 12 hr tablet Take 1 tablet for 3 days then increase to 1 tablet twice a day 04/23/19  Yes Martyn Malay, MD  meclizine (ANTIVERT) 25 MG tablet Take 1 tablet (25 mg total) by mouth 3 (three) times daily as needed for dizziness. 10/14/17  Yes Jacqlyn Larsen, PA-C  omeprazole (PRILOSEC) 40 MG capsule Take 1 capsule (40 mg total) by mouth daily. 05/10/19  Yes Martyn Malay, MD  sodium fluoride (PREVIDENT 5000 PLUS) 1.1 % CREA dental cream Apply cream to tooth brush. Brush teeth for 2 minutes. Spit out excess. DO NOT rinse afterwards. Repeat nightly. 12/23/17  Yes Lenn Cal, DDS     Positive ROS: Otherwise negative  All other systems have been reviewed and were otherwise negative with the exception of those mentioned in the HPI and as above.  Physical Exam: Constitutional: Alert, well-appearing, no acute distress Ears: External ears without lesions or tenderness. Ear canals are clear bilaterally with intact, clear TMs bilaterally.  On hearing screening with a 512 1024 tuning fork she has moderate hearing loss on the left in normal hearing on the  right.  AC > BC bilaterally.  Weber lateralized to the right. On Dix-Hallpike testing I did not elicit any vertigo, nystagmus associated with the head turned to the right or left. Nasal: External nose without lesions. Clear nasal passages Oral: Lips and gums without lesions. Tongue and palate mucosa without lesions. Posterior oropharynx clear. Neck: No palpable adenopathy or masses.  She is status post left parotidectomy with no palpable masses. Respiratory: Breathing comfortably  Skin: No  facial/neck lesions or rash noted.  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 I did not elicit any BPPV being in the office today however I did give her information on BPPV and the Epley maneuver. Recommended mostly physical therapy which should gradually help balance. Concerning her tinnitus I discussed with her concerning masking noise to help control the tinnitus as well as use of hearing aid would help with tinnitus.  Also gave her some samples of Lipo flavonoid to try as this is beneficial for tinnitus in some patients.  Plan: If balance does not gradually improve consider obtaining MRI scan as symptoms could be representative of a retrocochlear pathology such as an acoustic neuroma versus secondary to radiation therapy which is the most probable cause.   Radene Journey, MD   CC:

## 2019-06-07 ENCOUNTER — Other Ambulatory Visit: Payer: Self-pay

## 2019-06-07 ENCOUNTER — Ambulatory Visit (INDEPENDENT_AMBULATORY_CARE_PROVIDER_SITE_OTHER): Payer: 59 | Admitting: Family Medicine

## 2019-06-07 VITALS — BP 125/80 | HR 108 | Ht 64.5 in | Wt 210.2 lb

## 2019-06-07 DIAGNOSIS — M25562 Pain in left knee: Secondary | ICD-10-CM | POA: Diagnosis not present

## 2019-06-07 DIAGNOSIS — M25559 Pain in unspecified hip: Secondary | ICD-10-CM | POA: Insufficient documentation

## 2019-06-07 DIAGNOSIS — M5442 Lumbago with sciatica, left side: Secondary | ICD-10-CM | POA: Diagnosis not present

## 2019-06-07 DIAGNOSIS — M549 Dorsalgia, unspecified: Secondary | ICD-10-CM | POA: Insufficient documentation

## 2019-06-07 DIAGNOSIS — F329 Major depressive disorder, single episode, unspecified: Secondary | ICD-10-CM

## 2019-06-07 DIAGNOSIS — F32A Depression, unspecified: Secondary | ICD-10-CM

## 2019-06-07 DIAGNOSIS — M25569 Pain in unspecified knee: Secondary | ICD-10-CM | POA: Insufficient documentation

## 2019-06-07 MED ORDER — CYCLOBENZAPRINE HCL 5 MG PO TABS: 5.0000 mg | ORAL_TABLET | Freq: Three times a day (TID) | ORAL | 0 refills | Status: DC | PRN

## 2019-06-07 MED ORDER — NAPROXEN 500 MG PO TABS: 500.0000 mg | ORAL_TABLET | Freq: Two times a day (BID) | ORAL | 0 refills | Status: DC | PRN

## 2019-06-07 NOTE — Progress Notes (Signed)
SUBJECTIVE:   CHIEF COMPLAINT / HPI:   Fall Patient presenting for follow-up of fall.  Fell last Monday after she slipped on some leaves and fell.  Denies any dizziness or loss of consciousness at the time.  Felt like her leg turned in "like a horseshoe".  States that since then her left leg, left knee, left hip, left side back has been hurting.  States it feels like when she had a herniated disc.  Reports some numbness and tingling down her left leg as well.  She was not able to go to school due to pain.  Did report some knee swelling.  Denies any bruising.  Did report knee instability.  Denies any walking or clicking.  Did fall in December as well but this was related to her vertigo.  Denies any loss of consciousness or head trauma at the time of this incident.  States she can walk but with pain.  Denies any incontinence.  Has been taking ibuprofen.  Denies any fever.  Does report some back spasms as well.  Elevated PHQ2 Patient with elevated PHQ 2.  Refusing PHQ-9.  Would not like to discuss further with me as she reports she is seeing a therapist will discuss with her.  PERTINENT  PMH / PSH: obesity, tobacco use, parotid gland tumor  OBJECTIVE:   BP 125/80   Pulse (!) 108   Ht 5' 4.5" (1.638 m)   Wt 210 lb 3.2 oz (95.3 kg)   SpO2 92%   BMI 35.52 kg/m   Knee: - Inspection: no gross deformity. No swelling/effusion, erythema or bruising. Skin intact - Palpation: TTP of medial joint line  - ROM: full active ROM with flexion and extension in knee and hip - Strength: 5/5 strength - Neuro/vasc: NV intact - Special Tests: - LIGAMENTS: negative anterior and posterior drawer, negative Lachman's, no MCL or LCL laxity  -- MENISCUS: negative McMurray's  -- PF JOINT: nml patellar mobility bilaterally.  negative patellar grind, negative patellar apprehension Hip:  - Inspection: No gross deformity, no swelling, erythema, or ecchymosis - Palpation: TTP of left hip, none over greater  trochanter - ROM: Normal range of motion on Flexion, extension, abduction, internal and external rotation - Strength: Normal strength. - Neuro/vasc: NV intact distally - Special Tests: Negative FABER and FADIR   Neck/Back: - Inspection: no gross deformity or asymmetry, swelling or ecchymosis - Palpation: no TTP of spinous process, TTP of left lumbar musculature  - ROM: full active ROM of the cervical spine with neck extension, rotation, flexion - no pain in all directions  ASSESSMENT/PLAN:   Depression Patient with elevated PHQ 2 but refusing PHQ-9.  This appears to be a chronic problem for patient.  She is seeing a therapist.  At last PCP visit she was restarted on Wellbutrin.  Denies any SI/HI.  Have sent message to PCP to FYI them.  Knee pain Patient with acute left-sided knee pain.  Some medial line tenderness.  Otherwise normal knee exam and negative special test.  Advised conservative measures at this time.  Advised to use naproxen for pain relief as well as Tylenol.  Patient has a history of GERD so patient should limit naproxen use when possible.  Have placed referral for physical therapy.  Follow-up in 2 to 4 weeks.  Back pain Patient with back pain on the left side as well as sciatica.  I anticipate this is an exacerbation of her disc herniation after fall.  Advised conservative measures.  Will  use naproxen as needed as well as intermittent Tylenol use.  Can also use Flexeril for back spasms.  Have placed referral for physical therapy.  Strict return precautions given.  No red flag symptoms at this time but ED precautions discussed.  Follow-up in 2 to 4 weeks with PCP.  Hip pain Normal physical exam which is reassuring.  Unlikely acute fracture as this is been a week now.  Will advise conservative measures.  Patient is already on naproxen and Flexeril so we will continue those and as well as Tylenol.  Follow-up in 2 to 4 weeks with PCP.    Discussed with Dr. Kelli Hope, [place]

## 2019-06-07 NOTE — Assessment & Plan Note (Signed)
Patient with acute left-sided knee pain.  Some medial line tenderness.  Otherwise normal knee exam and negative special test.  Advised conservative measures at this time.  Advised to use naproxen for pain relief as well as Tylenol.  Patient has a history of GERD so patient should limit naproxen use when possible.  Have placed referral for physical therapy.  Follow-up in 2 to 4 weeks.

## 2019-06-07 NOTE — Patient Instructions (Addendum)
Back Exercises These exercises help to make your trunk and back strong. They also help to keep the lower back flexible. Doing these exercises can help to prevent back pain or lessen existing pain.  If you have back pain, try to do these exercises 2-3 times each day or as told by your doctor.  As you get better, do the exercises once each day. Repeat the exercises more often as told by your doctor.  To stop back pain from coming back, do the exercises once each day, or as told by your doctor. Exercises Single knee to chest Do these steps 3-5 times in a row for each leg: 1. Lie on your back on a firm bed or the floor with your legs stretched out. 2. Bring one knee to your chest. 3. Grab your knee or thigh with both hands and hold them it in place. 4. Pull on your knee until you feel a gentle stretch in your lower back or buttocks. 5. Keep doing the stretch for 10-30 seconds. 6. Slowly let go of your leg and straighten it. Pelvic tilt Do these steps 5-10 times in a row: 1. Lie on your back on a firm bed or the floor with your legs stretched out. 2. Bend your knees so they point up to the ceiling. Your feet should be flat on the floor. 3. Tighten your lower belly (abdomen) muscles to press your lower back against the floor. This will make your tailbone point up to the ceiling instead of pointing down to your feet or the floor. 4. Stay in this position for 5-10 seconds while you gently tighten your muscles and breathe evenly. Cat-cow Do these steps until your lower back bends more easily: 1. Get on your hands and knees on a firm surface. Keep your hands under your shoulders, and keep your knees under your hips. You may put padding under your knees. 2. Let your head hang down toward your chest. Tighten (contract) the muscles in your belly. Point your tailbone toward the floor so your lower back becomes rounded like the back of a cat. 3. Stay in this position for 5 seconds. 4. Slowly lift your  head. Let the muscles of your belly relax. Point your tailbone up toward the ceiling so your back forms a sagging arch like the back of a cow. 5. Stay in this position for 5 seconds.  Press-ups Do these steps 5-10 times in a row: 1. Lie on your belly (face-down) on the floor. 2. Place your hands near your head, about shoulder-width apart. 3. While you keep your back relaxed and keep your hips on the floor, slowly straighten your arms to raise the top half of your body and lift your shoulders. Do not use your back muscles. You may change where you place your hands in order to make yourself more comfortable. 4. Stay in this position for 5 seconds. 5. Slowly return to lying flat on the floor.  Bridges Do these steps 10 times in a row: 1. Lie on your back on a firm surface. 2. Bend your knees so they point up to the ceiling. Your feet should be flat on the floor. Your arms should be flat at your sides, next to your body. 3. Tighten your butt muscles and lift your butt off the floor until your waist is almost as high as your knees. If you do not feel the muscles working in your butt and the back of your thighs, slide your feet 1-2 inches   farther away from your butt. 4. Stay in this position for 3-5 seconds. 5. Slowly lower your butt to the floor, and let your butt muscles relax. If this exercise is too easy, try doing it with your arms crossed over your chest. Belly crunches Do these steps 5-10 times in a row: 1. Lie on your back on a firm bed or the floor with your legs stretched out. 2. Bend your knees so they point up to the ceiling. Your feet should be flat on the floor. 3. Cross your arms over your chest. 4. Tip your chin a little bit toward your chest but do not bend your neck. 5. Tighten your belly muscles and slowly raise your chest just enough to lift your shoulder blades a tiny bit off of the floor. Avoid raising your body higher than that, because it can put too much stress on your low  back. 6. Slowly lower your chest and your head to the floor. Back lifts Do these steps 5-10 times in a row: 1. Lie on your belly (face-down) with your arms at your sides, and rest your forehead on the floor. 2. Tighten the muscles in your legs and your butt. 3. Slowly lift your chest off of the floor while you keep your hips on the floor. Keep the back of your head in line with the curve in your back. Look at the floor while you do this. 4. Stay in this position for 3-5 seconds. 5. Slowly lower your chest and your face to the floor. Contact a doctor if:  Your back pain gets a lot worse when you do an exercise.  Your back pain does not get better 2 hours after you exercise. If you have any of these problems, stop doing the exercises. Do not do them again unless your doctor says it is okay. Get help right away if:  You have sudden, very bad back pain. If this happens, stop doing the exercises. Do not do them again unless your doctor says it is okay. This information is not intended to replace advice given to you by your health care provider. Make sure you discuss any questions you have with your health care provider. Document Revised: 11/13/2017 Document Reviewed: 11/13/2017 Elsevier Patient Education  [place].  1. I have given you a prescription for naproxen. This is a strong anti-inflammatory medication. Please be carefull with this as you have GERD. Stop taking it if you have abdominal pain  2. Take flexeril as needed 3. Alternate naproxen with tylenol as needed 4. I have referred you to physical therapy 5. Please follow up with vestibular rehab for dizziness  6. If worsening pain follow up sooner than 2-4 weeks, otherwise schedule a follow up with your PCP in 2-4 weeks 7. Go to the ER if you have incontinence

## 2019-06-07 NOTE — Assessment & Plan Note (Signed)
Patient with elevated PHQ 2 but refusing PHQ-9.  This appears to be a chronic problem for patient.  She is seeing a therapist.  At last PCP visit she was restarted on Wellbutrin.  Denies any SI/HI.  Have sent message to PCP to FYI them.

## 2019-06-07 NOTE — Assessment & Plan Note (Signed)
Patient with back pain on the left side as well as sciatica.  I anticipate this is an exacerbation of her disc herniation after fall.  Advised conservative measures.  Will use naproxen as needed as well as intermittent Tylenol use.  Can also use Flexeril for back spasms.  Have placed referral for physical therapy.  Strict return precautions given.  No red flag symptoms at this time but ED precautions discussed.  Follow-up in 2 to 4 weeks with PCP.

## 2019-06-07 NOTE — Assessment & Plan Note (Signed)
Normal physical exam which is reassuring.  Unlikely acute fracture as this is been a week now.  Will advise conservative measures.  Patient is already on naproxen and Flexeril so we will continue those and as well as Tylenol.  Follow-up in 2 to 4 weeks with PCP.

## 2019-06-14 ENCOUNTER — Other Ambulatory Visit: Payer: Self-pay

## 2019-06-14 ENCOUNTER — Ambulatory Visit (INDEPENDENT_AMBULATORY_CARE_PROVIDER_SITE_OTHER): Payer: 59

## 2019-06-14 DIAGNOSIS — Z1159 Encounter for screening for other viral diseases: Secondary | ICD-10-CM

## 2019-06-15 ENCOUNTER — Other Ambulatory Visit: Payer: Self-pay | Admitting: Gastroenterology

## 2019-06-15 LAB — SARS CORONAVIRUS 2 (TAT 6-24 HRS): SARS Coronavirus 2: NEGATIVE

## 2019-06-16 ENCOUNTER — Ambulatory Visit (AMBULATORY_SURGERY_CENTER): Payer: 59 | Admitting: Gastroenterology

## 2019-06-16 ENCOUNTER — Encounter: Payer: Self-pay | Admitting: Gastroenterology

## 2019-06-16 ENCOUNTER — Other Ambulatory Visit: Payer: Self-pay

## 2019-06-16 VITALS — BP 113/75 | HR 70 | Temp 96.9°F | Resp 21 | Ht 65.0 in | Wt 212.0 lb

## 2019-06-16 DIAGNOSIS — K219 Gastro-esophageal reflux disease without esophagitis: Secondary | ICD-10-CM

## 2019-06-16 DIAGNOSIS — R1314 Dysphagia, pharyngoesophageal phase: Secondary | ICD-10-CM

## 2019-06-16 MED ORDER — SODIUM CHLORIDE 0.9 % IV SOLN: 500.0000 mL | Freq: Once | INTRAVENOUS | Status: DC

## 2019-06-16 MED ORDER — SUCRALFATE 1 G PO TABS: 1.0000 g | ORAL_TABLET | Freq: Two times a day (BID) | ORAL | 3 refills | Status: DC | PRN

## 2019-06-16 NOTE — Patient Instructions (Signed)
Thank you for allowing [place] to care for you today!  Resume previous diet.  Continue present medications.  Adding prescription of Sucralfate tablets 1 gram ( dissolve one tablet in 15 cc water which makes a drinkable slurry)  Take twice a day as needed for breat through heartburn.  This has been sent to your pharmacy.  Establish an appointment with an [facility], and Throat doctor for evaluation and treatment of dizziness (vertigo), cough, hoarseness, and trouble swallowing.   Take your Endoscopy report when you see the ENT doctor.  Return to your normal activities tomorrow.YOU HAD AN ENDOSCOPIC PROCEDURE TODAY AT [facility]:   Refer to the procedure report that was given to you for any specific questions about what was found during the examination.  If the procedure report does not answer your questions, please call your gastroenterologist to clarify.  If you requested that your care partner not be given the details of your procedure findings, then the procedure report has been included in a sealed envelope for you to review at your convenience later.  YOU SHOULD EXPECT: Some feelings of bloating in the abdomen. Passage of more gas than usual.  Walking can help get rid of the air that was put into your GI tract during the procedure and reduce the bloating. If you had a lower endoscopy (such as a colonoscopy or flexible sigmoidoscopy) you may notice spotting of blood in your stool or on the toilet paper. If you underwent a bowel prep for your procedure, you may not have a normal bowel movement for a few days.  Please Note:  You might notice some irritation and congestion in your nose or some drainage.  This is from the oxygen used during your procedure.  There is no need for concern and it should clear up in a day or so.  SYMPTOMS TO REPORT IMMEDIATELY:    Following upper endoscopy (EGD)  Vomiting of blood or coffee ground material  New chest pain or pain under the shoulder  blades  Painful or persistently difficult swallowing  New shortness of breath  Fever of 100F or higher  Black, tarry-looking stools  For urgent or emergent issues, a gastroenterologist can be reached at any hour by calling [phone]. Do not use MyChart messaging for urgent concerns.    DIET:  We do recommend a small meal at first, but then you may proceed to your regular diet.  Drink plenty of fluids but you should avoid alcoholic beverages for 24 hours.  ACTIVITY:  You should plan to take it easy for the rest of today and you should NOT DRIVE or use heavy machinery until tomorrow (because of the sedation medicines used during the test).    FOLLOW UP: Our staff will call the number listed on your records 48-72 hours following your procedure to check on you and address any questions or concerns that you may have regarding the information given to you following your procedure. If we do not reach you, we will leave a message.  We will attempt to reach you two times.  During this call, we will ask if you have developed any symptoms of COVID 19. If you develop any symptoms (ie: fever, flu-like symptoms, shortness of breath, cough etc.) before then, please call [phone].  If you test positive for Covid 19 in the 2 weeks post procedure, please call and report this information to [place].    If any biopsies were taken you will be contacted by phone  or by letter within the next 1-3 weeks.  Please call us at [phone] if you have not heard about the biopsies in 3 weeks.    SIGNATURES/CONFIDENTIALITY: You and/or your care partner have signed paperwork which will be entered into your electronic medical record.  These signatures attest to the fact that that the information above on your After Visit Summary has been reviewed and is understood.  Full responsibility of the confidentiality of this discharge information lies with you and/or your care-partner.

## 2019-06-16 NOTE — Progress Notes (Signed)
pt tolerated well. VSS. awake and to recovery. Report given to RN.  

## 2019-06-16 NOTE — Op Note (Signed)
[facility] Patient Name: Shelley Mason Procedure Date: 06/16/2019 9:45 AM MRN: [id number] Endoscopist: Mallie Mussel L. 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 also chronic                            cough, hoarseness,vertigo) Medicines:                Monitored Anesthesia Care Procedure:                Pre-Anesthesia Assessment:                           - Prior to the procedure, a History and Physical                            was performed, and patient medications and                            allergies were reviewed. The patient's tolerance of                            previous anesthesia was also reviewed. The risks                            and benefits of the procedure and the sedation                            options and risks were discussed with the patient.                            All questions were answered, and informed consent                            was obtained. Prior Anticoagulants: The patient has                            taken no previous anticoagulant or antiplatelet                            agents. ASA Grade Assessment: III - A patient with                            severe systemic disease. After reviewing the risks  and benefits, the patient was deemed in                            satisfactory condition to undergo the procedure.                           After obtaining informed consent, the endoscope was                            passed under direct vision. Throughout the                            procedure, the patient's blood pressure, pulse, and                            oxygen saturations were  monitored continuously. The                            Endoscope was introduced through the mouth, and                            advanced to the second part of duodenum. The upper                            GI endoscopy was accomplished without difficulty.                            The patient tolerated the procedure well. Scope In: Scope Out: Findings:                 The larynx was normal.                           The esophagus was normal.                           There is no endoscopic evidence of Barrett's                            esophagus, esophagitis, hiatal hernia or stricture                            in the entire esophagus.                           The stomach was normal.                           The cardia and gastric fundus were normal on                            retroflexion.                           The examined duodenum was normal. Complications:            No immediate complications.  Estimated Blood Loss:     Estimated blood loss: none. Impression:               - Normal larynx.                           - Normal esophagus.                           - Normal stomach.                           - Normal examined duodenum.                           - No specimens collected.                           Symptoms suggestive of GERD, but also suspected to                            be a functional overlay to heartburn.                           Dysphagia multifactorial - poor bottom dentition                            and decreased saliva production from XRT. Suspected                            vocal cord dysfunction or other radiation-ralated                            pharyngeal/laryngeal dysfunction contributing as                            well. Recommendation:           - Patient has a contact number available for                            emergencies. The signs and symptoms of potential                            delayed complications were discussed with  the                            patient. Return to normal activities tomorrow.                            Written discharge instructions were provided to the                            patient.                           - Resume previous diet.                           -  Continue present medications.                           - Follow an antireflux regimen.                           - Use sucralfate tablets 1 gram PO (dissolved in                            15cc water as a slurry) BID as needed for                            breakthrough heartburn. Disp# 60, RF 3                           - Establish care with new ENT physician for                            evaluation and treatment of vertigo, cough,                            hoarseness, swallowing trouble. Shelley Mason L. Loletha Carrow, MD 06/16/2019 10:13:22 AM This report has been signed electronically.

## 2019-06-18 ENCOUNTER — Telehealth: Payer: Self-pay

## 2019-06-18 NOTE — Telephone Encounter (Signed)
  Follow up Call-  Call back number 06/16/2019  Post procedure Call Back phone  # [phone]  Permission to leave phone message Yes  Some recent data might be hidden     Patient questions:  Do you have a fever, pain , or abdominal swelling? No. Pain Score  0 *  Have you tolerated food without any problems? Yes.    Have you been able to return to your normal activities? Yes.    Do you have any questions about your discharge instructions: Diet   No. Medications  No. Follow up visit  No.  Do you have questions or concerns about your Care? No.  Actions: * If pain score is 4 or above: No action needed, pain <4.  1. Have you developed a fever since your procedure? No  2.   Have you had an respiratory symptoms (SOB or cough) since your procedure? No 3.   Have you tested positive for COVID 19 since your procedure No  4.   Have you had any family members/close contacts diagnosed with the COVID 19 since your procedure?  No   If yes to any of these questions please route to Joylene John, RN and Erenest Rasher, RN

## 2019-06-23 ENCOUNTER — Ambulatory Visit: Payer: 59 | Attending: Family Medicine | Admitting: Physical Therapy

## 2019-06-23 ENCOUNTER — Other Ambulatory Visit: Payer: Self-pay

## 2019-06-23 ENCOUNTER — Encounter: Payer: Self-pay | Admitting: Physical Therapy

## 2019-06-23 DIAGNOSIS — M545 Low back pain, unspecified: Secondary | ICD-10-CM

## 2019-06-23 DIAGNOSIS — R2689 Other abnormalities of gait and mobility: Secondary | ICD-10-CM | POA: Diagnosis present

## 2019-06-23 DIAGNOSIS — M25552 Pain in left hip: Secondary | ICD-10-CM | POA: Diagnosis present

## 2019-06-24 ENCOUNTER — Encounter: Payer: Self-pay | Admitting: Physical Therapy

## 2019-06-24 NOTE — Therapy (Signed)
[place], [place], [address] Phone: [phone]   Fax:  [phone]  Physical Therapy Evaluation  Patient Details  Name: Shelley Mason MRN: [id number] Date of Birth: [birth year] Referring Provider (PT): Dr Graylin Shiver    Encounter Date: 06/23/2019  PT End of Session - 06/23/19 1640    Visit Number  1    Number of Visits  12    Date for PT Re-Evaluation  08/04/19    Authorization Type  [employer]    PT Start Time  1630    PT Stop Time  1714    PT Time Calculation (min)  44 min    Activity Tolerance  Patient tolerated treatment well    Behavior During Therapy  [facility] for tasks assessed/performed       Past Medical History:  Diagnosis Date  . Anemia    history of  . Anxiety   . Cigarette smoker   . Depression   . Fibroids    history of uterine fibroids  . GERD (gastroesophageal reflux disease)   . Hearing loss   . Hearing loss   . History of radiation therapy 09/22/17- 11/06/17   tumor bed left neck with added margin o facial nerve.   . Obstructive sleep apnea   . Primary cancer of parotid gland ([place])   . 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 Vertigo     Past Surgical History:  Procedure Laterality Date  . CESAREAN SECTION     x 3  . COLONOSCOPY    . CYSTOSCOPY    . LAPAROSCOPIC ASSISTED VAGINAL HYSTERECTOMY    . PAROTIDECTOMY Left   . TUBAL LIGATION      There were no vitals filed for this visit.   Subjective Assessment - 06/23/19 1635    Subjective  Patient has low back and left hip pain. She feels like if sh does something quickly the leg goes out. She feels like her knee buckles. She intially fell in [place] and hurt her knee. She was in a brace.    Pertinent History  Left knee injury in decemeber; multiple falls; vertigo; radiation  from cancer    Limitations  Standing;Walking    How long can you sit comfortably?  sitting for a long period of time causes the back to hurt    How  long can you walk comfortably?  Needs rest after a long period of time    Currently in Pain?  Yes    Pain Score  5     Pain Location  Hip    Pain Orientation  Left    Pain Descriptors / Indicators  Aching    Pain Type  Chronic pain    Pain Onset  More than a month ago    Aggravating Factors   sitting for a long period of time    Pain Relieving Factors  rest    Effect of Pain on Daily Activities  difficulty perfroming daily activity         [facility] PT Assessment - 06/24/19 0001      Assessment   Medical Diagnosis  Left Hip Pain     Referring Provider (PT)  Dr Graylin Shiver     Onset Date/Surgical Date  --   3 weeks prior was fall    Next MD Visit  Nothing scheduled at this time     Prior Therapy  None       Precautions   Precautions  Fall    Precaution Comments  patient has vertigo       Restrictions   Weight Bearing Restrictions  No      Balance Screen   Has the patient fallen in the past 6 months  Yes    How many times?  2    Has the patient had a decrease in activity level because of a fear of falling?   Yes    Is the patient reluctant to leave their home because of a fear of falling?   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     Leisure  walking       Cognition   Overall Cognitive Status  Within Functional Limits for tasks assessed    Attention  Focused    Focused Attention  Appears intact    Memory  Appears intact    Awareness  Appears intact    Problem Solving  Appears intact      Observation/Other Assessments   Focus on Therapeutic Outcomes (FOTO)   60% limitation 39% expected       Sensation   Light Touch  Appears Intact    Additional Comments  denies parathesias       Coordination   Gross Motor Movements are Fluid and Coordinated  Yes    Fine Motor Movements are Fluid and  Coordinated  Yes      Posture/Postural Control   Posture Comments  rounded shoulders/ forward head       ROM / Strength   AROM / PROM / Strength  AROM;PROM;Strength      AROM   Overall AROM Comments  painful end range left knee flexion and internal rotation     AROM Assessment Site  Hip      PROM   Overall PROM Comments  painfull end range left hip flexion and internal rotation       Strength   Overall Strength Comments  right hip 5/5     Strength Assessment Site  Hip;Knee    Right/Left Hip  Left    Left Hip Flexion  4+/5    Left Hip ABduction  4+/5    Left Hip ADduction  4+/5    Right/Left Knee  Left    Left Knee Flexion  5/5    Left Knee Extension  5/5      Palpation   Palpation comment  tenderness to palaption in the left gluteal and left lumbar paraspinals ; left hip elevation in standing but in supine no leg length descrpency and difficult to palpate pelvic obliquitys       Ambulation/Gait   Gait Comments  left hip elevation in standing and with gait.         Balance assessment: tandem stance normal narrow base normal           Objective measurements completed on examination: See above findings.      [facility] Adult PT Treatment/Exercise - 06/24/19 0001      Lumbar Exercises: Stretches   Active Hamstring Stretch Limitations  seated hamstring stretch 3x20 sec hold with mod cuing for range     Lower Trunk Rotation Limitations  x10     Piriformis Stretch Limitations  2x20 sec hold bilateral  Other Lumbar Stretch Exercise  tennis ball trigger point release       Manual Therapy   Manual therapy comments  gentle LAD grade II and III occialtions; patient reported improved pain              PT Education - 06/23/19 1645    Education Details  reviewed HEP and symptom mangement    Person(s) Educated  Patient    Methods  Explanation;Demonstration;Tactile cues;Verbal cues    Comprehension  Verbalized understanding;Returned demonstration;Verbal cues  required;Tactile cues required       PT Short Term Goals - 06/24/19 1410      PT SHORT TERM GOAL #1   Title  Patient will demonstrate 5/5 left hip strength    Time  3    Period  Weeks    Status  New    Target Date  07/15/19      PT SHORT TERM GOAL #2   Title  Patient will demonstrate full passive ROM of the hip    Time  3    Period  Weeks    Status  New    Target Date  07/15/19        PT Long Term Goals - 06/24/19 1500      PT LONG TERM GOAL #1   Title  Patient will stand for 30 min  without increased pain in order to perfrom ADL's    Time  6    Period  Weeks    Status  New    Target Date  08/05/19      PT LONG TERM GOAL #2   Title  Patient will denostrate a 39% limitation on FOTO in order to demonstrate improved functional ability    Baseline  60% limitation    Time  6    Period  Weeks    Status  New    Target Date  08/05/19             Plan - 06/23/19 1647    Clinical Impression Statement  Patient is a 54 year old female with left hip and lower back pain following a fall. She has freqeunt falls 2nd to vertigo which came on after her radiation. She has had 2 falls since [employer]. She has fai strength in her left hip and only a mild motion limitation. She does have signficiant spasming in her lumbar spine and gluteal; She ambualtes with left hip elevation. It was difficult to palpate whether she had a plevic obliquity but if she doesnt respond to lumbar/ hip treatment we will look deeper into pelvic allighnment. She has increased pain with any prolonged position and with standing and walking. She would benefit from skilled therapy to improve ability to stand and walk and to reduce pain in her hip.    Personal Factors and Comorbidities  Comorbidity 1;Comorbidity 3+;Comorbidity 2    Comorbidities  vertigo, frequent falls; left knee pain from a fall; cancer;    Examination-Activity Limitations  Carry;Squat;Locomotion Level;Stand    Examination-Participation  Restrictions  Community Activity;Shop;Laundry    Stability/Clinical Decision Making  Evolving/Moderate complexity    Clinical Decision Making  Moderate    Rehab Potential  Good    PT Frequency  2x / week    PT Duration  6 weeks    PT Treatment/Interventions  ADLs/Self Care Home Management;Cryotherapy;Electrical Stimulation;Iontophoresis 4mg /ml Dexamethasone;Traction;Ultrasound;DME Instruction;Gait training;Stair training;Therapeutic exercise;Balance training;Therapeutic activities;Functional mobility training;Patient/family education;Manual techniques;Passive range of motion;Taping    PT Next Visit Plan  inferior and  posterior glides of the hip if pain free, soft tissue work to left gluteal and lower back; be aware of vertigo. Begin light hip strengthening if patient can tolerate supine marching and clam shell; consider PPT; progress to standing as tolerated; modalities PRN    PT Home Exercise Plan  LTR; tennis ball trigger point release; piriformis stretch ( already doing at home) seated hamstring stretch    Consulted and Agree with Plan of Care  Patient       Patient will benefit from skilled therapeutic intervention in order to improve the following deficits and impairments:  Abnormal gait, Difficulty walking, Pain, Decreased activity tolerance, Decreased safety awareness, Increased fascial restricitons, Decreased range of motion, Decreased mobility, Decreased endurance, Increased muscle spasms, Improper body mechanics  Visit Diagnosis: Acute left-sided low back pain without sciatica  Pain in left hip  Other abnormalities of gait and mobility     Problem List Patient Active Problem List   Diagnosis Date Noted  . Knee pain 06/07/2019  . Back pain 06/07/2019  . Hip pain 06/07/2019  . Gastroesophageal reflux disease 04/23/2019  . Obesity (BMI 35.0-39.9 without comorbidity) 04/23/2019  . 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 Depression 04/21/2017  . Cigarette smoker  06/21/2015    Carney Living PT DPT  06/24/2019, 4:23 PM  [facility] [address] [place], [place], [address] Phone: [phone]   Fax:  [phone]  Name: Shelley Mason MRN: [id number] Date of Birth: 06-Dec-1965

## 2019-06-28 NOTE — Progress Notes (Signed)
Counseling intern spoke to patient on 06/28/2019 for a telehealth counseling termination session via phone. Counseling intern provided space for pt to open up about experiences and responded with empathy, compassion, and normalization of feelings.   Pt reported she had a hard week, but "made it through". She was able to take a break today and now knows that although she has a lot of work to turn in on Thursday evening, she will then be on break until late August. Pt and CI reflected about her experience in therapy. Pt stated that she found counseling extremely helpful. She now feels emotionally strong and stated she learned a lot including not to let so much bother her, to stand up for herself, to think positively, not to expect others to understand exactly what she is going through, to spend more time around positive people, to recognize maladaptive behavior, to use calming strategies, and to let stuff go without feeling bad. CI expressed that the pt worked hard to accomplish her goals and that her strengths include assertiveness, making herself a priority, being self-aware, and having determination in the face of obstacles. Pt stated she plans to complete her degree in the fall and will graduate in December 2021. She stated she feels excited and proud to think back at all she accomplished over the past year. 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   CI provided space for emotional processing and reviewed coping strategies with the pt. CI used a strengths based approach and pt was able to verbalize what she is taking away from her counseling experience. Pt stated she would love to get connected with the new counseling intern in the fall.  Art Buff Niles Counseling Intern Voicemail:   [phone]

## 2019-07-07 ENCOUNTER — Ambulatory Visit: Payer: 59 | Attending: Family Medicine | Admitting: Rehabilitative and Restorative Service Providers"

## 2019-07-07 ENCOUNTER — Other Ambulatory Visit: Payer: Self-pay

## 2019-07-07 DIAGNOSIS — M545 Low back pain, unspecified: Secondary | ICD-10-CM

## 2019-07-07 DIAGNOSIS — M25552 Pain in left hip: Secondary | ICD-10-CM | POA: Insufficient documentation

## 2019-07-07 DIAGNOSIS — R29898 Other symptoms and signs involving the musculoskeletal system: Secondary | ICD-10-CM | POA: Insufficient documentation

## 2019-07-07 DIAGNOSIS — C07 Malignant neoplasm of parotid gland: Secondary | ICD-10-CM

## 2019-07-07 DIAGNOSIS — Z9189 Other specified personal risk factors, not elsewhere classified: Secondary | ICD-10-CM | POA: Insufficient documentation

## 2019-07-07 DIAGNOSIS — R2689 Other abnormalities of gait and mobility: Secondary | ICD-10-CM | POA: Diagnosis present

## 2019-07-07 NOTE — Therapy (Signed)
[place], [place], [address] Phone: [phone]   Fax:  [phone]  Physical Therapy Treatment  Patient Details  Name: Shelley Mason MRN: [id number] Date of Birth: 1965-10-05 Referring Provider (PT): Dr Graylin Shiver    Encounter Date: 07/07/2019  PT End of Session - 07/07/19 1703    Visit Number  2    Number of Visits  12    Date for PT Re-Evaluation  08/04/19    Authorization Type  bright health    PT Start Time  0503    PT Stop Time  0548    PT Time Calculation (min)  45 min    Activity Tolerance  Patient tolerated treatment well    Behavior During Therapy  [facility] for tasks assessed/performed       Past Medical History:  Diagnosis Date  . Anemia    history of  . Anxiety   . Cigarette smoker   . Depression   . Fibroids    history of uterine fibroids  . GERD (gastroesophageal reflux disease)   . Hearing loss   . Hearing loss   . History of radiation therapy 09/22/17- 11/06/17   tumor bed left neck with added margin o facial nerve.   . Obstructive sleep apnea   . Primary cancer of parotid gland ([place])   . 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 Vertigo     Past Surgical History:  Procedure Laterality Date  . CESAREAN SECTION     x 3  . COLONOSCOPY    . CYSTOSCOPY    . LAPAROSCOPIC ASSISTED VAGINAL HYSTERECTOMY    . PAROTIDECTOMY Left   . TUBAL LIGATION      There were no vitals filed for this visit.  Subjective Assessment - 07/07/19 1703    Subjective  My hip is mainly what is hurting and is sending signals to my back. I am stressed. 8/10 pain like in a sciatic nerve.    Limitations  Standing;Walking    How long can you sit comfortably?  sitting for a long period of time causes the back to hurt    How long can you walk comfortably?  Needs rest after a long period of time    Patient Stated Goals  to be able to return to my normal life    Currently in Pain?  Yes    Pain Score  8     Pain  Location  Back    Pain Orientation  Left    Pain Descriptors / Indicators  Aching;Other (Comment);Radiating   intermittent   Pain Type  Chronic pain    Pain Onset  More than a month ago    Pain Frequency  Intermittent    Aggravating Factors   sititng for longer periods of time    Pain Relieving Factors  rest, rubbing the knee helps the hip nad back    Multiple Pain Sites  No                       [facility] Adult PT Treatment/Exercise - 07/07/19 0001      Lumbar Exercises: Supine   Other Supine Lumbar Exercises  lower trunk rotation bil 3x15 sec; lower trunk rotation with each knee extended on top 2x30 sec; single knee to chest 3x30 sec    Other Supine Lumbar Exercises  Review HEP      Modalities   Modalities  Moist Heat;Ultrasound  Moist Heat Therapy   Number Minutes Moist Heat  18 Minutes    Moist Heat Location  --   bil gluteals and lumbar spine supine during therex/pt ed     Ultrasound   Ultrasound Location  L Piriformis 2.5w/cm2 x 8 min continuous in R sidelying    Ultrasound Goals  --   to improve flexibility      Manual Therapy   Manual therapy comments  Soft tissue work, trigger point L glutes with emphasis on Piriformis; pt able to tolerate well. tightness palpated at beginning of treatment and decreased after treatment             PT Education - 07/07/19 1820    Education Details  Discussed with pt laying with pillow between knees when L sidelying at night with rationale; discussed with patient laying supine with two pillows under each knee to decrease lumbar/gluteal pressure; discussed heating pad at home no more than 20 min on low setting and not falling asleep on it and using a towel to decrease risk of burning between body and heating pad; advised pt she may be sore from deep tissue work and if she is to not do the trigger point release with the balls she was taught in other sessions; discussed piriformis stretch in epsom salt bath at home with  PT demo of how to perform while keeping contralateral LE on the tub floor    Person(s) Educated  Patient    Methods  Explanation;Demonstration;Verbal cues    Comprehension  Verbalized understanding;Returned demonstration       PT Short Term Goals - 07/07/19 1827      PT SHORT TERM GOAL #1   Title  Patient will demonstrate 5/5 left hip strength    Time  3    Period  Weeks    Status  On-going    Target Date  07/15/19      PT SHORT TERM GOAL #2   Title  Patient will demonstrate full passive ROM of the hip    Time  3    Period  Weeks    Status  On-going    Target Date  07/15/19        PT Long Term Goals - 06/24/19 1500      PT LONG TERM GOAL #1   Title  Patient will stand for 30 min  without increased pain in order to perfrom ADL's    Time  6    Period  Weeks    Status  New    Target Date  08/05/19      PT LONG TERM GOAL #2   Title  Patient will denostrate a 39% limitation on FOTO in order to demonstrate improved functional ability    Baseline  60% limitation    Time  6    Period  Weeks    Status  New    Target Date  08/05/19            Plan - 07/07/19 1824    Clinical Impression Statement  Pt demonstreates tightness in L Piriformis area which coincides with her pain. She was able to tolerate soft tissue work and trigger point to associates muscles and reported improvement after treatment. Tightness limited mobility. Pt would benefit from further PT for L Piriformis/hamstring flexibility, core strengthening, and L LE strengthening to assist with improved pain-free function. Sciatic nerve gliding may be appropriate as well. Lumbar flexibility would be beneficial. Pt would benefit from further PT to address  above deficits.    Examination-Activity Limitations  Carry;Squat;Locomotion Level;Stand    Examination-Participation Restrictions  Community Activity;Shop;Laundry    Rehab Potential  Good    PT Frequency  2x / week    PT Duration  6 weeks    PT  Treatment/Interventions  ADLs/Self Care Home Management;Cryotherapy;Electrical Stimulation;Iontophoresis 4mg /ml Dexamethasone;Traction;Ultrasound;DME Instruction;Gait training;Stair training;Therapeutic exercise;Balance training;Therapeutic activities;Functional mobility training;Patient/family education;Manual techniques;Passive range of motion;Taping    PT Next Visit Plan  inferior and posterior glides of the hip if pain free, soft tissue work to left gluteal and lower back; be aware of vertigo. Begin light hip strengthening if patient can tolerate supine marching and clam shell; consider PPT; progress to standing as tolerated; modalities PRN    PT Home Exercise Plan  LTR; tennis ball trigger point release; piriformis stretch ( already doing at home) seated hamstring stretch    Consulted and Agree with Plan of Care  Patient       Patient will benefit from skilled therapeutic intervention in order to improve the following deficits and impairments:  Abnormal gait, Difficulty walking, Pain, Decreased activity tolerance, Decreased safety awareness, Increased fascial restricitons, Decreased range of motion, Decreased mobility, Decreased endurance, Increased muscle spasms, Improper body mechanics  Visit Diagnosis: Acute left-sided low back pain without sciatica  Other symptoms and signs involving the musculoskeletal system  Pain in left hip  Other abnormalities of gait and mobility  Cancer of parotid gland ([facility])  At risk for lymphedema     Problem List Patient Active Problem List   Diagnosis Date Noted  . Knee pain 06/07/2019  . Back pain 06/07/2019  . Hip pain 06/07/2019  . Gastroesophageal reflux disease 04/23/2019  . Obesity (BMI 35.0-39.9 without comorbidity) 04/23/2019  . 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 Depression 04/21/2017  . Cigarette smoker 06/21/2015    Myra Rude, PT 07/07/2019, 6:31 PM  [facility] [address] [place], [place], [address] Phone: [phone]   Fax:  [phone]  Name: NATHA HANCHEY MRN: [id number] Date of Birth: [birth year]

## 2019-07-12 ENCOUNTER — Ambulatory Visit: Payer: 59 | Admitting: Physical Therapy

## 2019-07-12 ENCOUNTER — Encounter: Payer: Self-pay | Admitting: Physical Therapy

## 2019-07-12 ENCOUNTER — Other Ambulatory Visit: Payer: Self-pay

## 2019-07-12 ENCOUNTER — Other Ambulatory Visit: Payer: Self-pay | Admitting: Family Medicine

## 2019-07-12 DIAGNOSIS — R2689 Other abnormalities of gait and mobility: Secondary | ICD-10-CM

## 2019-07-12 DIAGNOSIS — M545 Low back pain, unspecified: Secondary | ICD-10-CM

## 2019-07-12 DIAGNOSIS — R921 Mammographic calcification found on diagnostic imaging of breast: Secondary | ICD-10-CM

## 2019-07-12 DIAGNOSIS — M25552 Pain in left hip: Secondary | ICD-10-CM

## 2019-07-12 DIAGNOSIS — R29898 Other symptoms and signs involving the musculoskeletal system: Secondary | ICD-10-CM

## 2019-07-13 NOTE — Therapy (Signed)
[place], [place], [address] Phone: [phone]   Fax:  [phone]  Physical Therapy Treatment  Patient Details  Name: Shelley Mason MRN: [id number] Date of Birth: 29-Mar-1965 Referring Provider (PT): Dr Graylin Shiver    Encounter Date: 07/12/2019  PT End of Session - 07/12/19 1637    Visit Number  3    Number of Visits  12    Date for PT Re-Evaluation  08/04/19    Authorization Type  [employer]    PT Start Time  1631    PT Stop Time  1712    PT Time Calculation (min)  41 min    Activity Tolerance  Patient tolerated treatment well    Behavior During Therapy  [facility] for tasks assessed/performed       Past Medical History:  Diagnosis Date  . Anemia    history of  . Anxiety   . Cigarette smoker   . Depression   . Fibroids    history of uterine fibroids  . GERD (gastroesophageal reflux disease)   . Hearing loss   . Hearing loss   . History of radiation therapy 09/22/17- 11/06/17   tumor bed left neck with added margin o facial nerve.   . Obstructive sleep apnea   . Primary cancer of parotid gland ([place])   . 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 Vertigo     Past Surgical History:  Procedure Laterality Date  . CESAREAN SECTION     x 3  . COLONOSCOPY    . CYSTOSCOPY    . LAPAROSCOPIC ASSISTED VAGINAL HYSTERECTOMY    . PAROTIDECTOMY Left   . TUBAL LIGATION      There were no vitals filed for this visit.  Subjective Assessment - 07/12/19 1635    Subjective  Patient feels like it is a bit better. Her pain level today is about a 5/10. She also continues to have pain and popping in her medial knee.    Pertinent History  Left knee injury in decemeber; multiple falls; vertigo; radiation  from cancer    How long can you sit comfortably?  sitting for a long period of time causes the back to hurt    How long can you walk comfortably?  Needs rest after a long period of time    Patient Stated Goals  to  be able to return to my normal life    Currently in Pain?  Yes    Pain Score  5     Pain Location  Back    Pain Orientation  Left    Pain Descriptors / Indicators  Aching    Pain Type  Chronic pain    Pain Onset  More than a month ago    Pain Frequency  Intermittent    Aggravating Factors   sitting for long periods    Pain Relieving Factors  rest    Effect of Pain on Daily Activities  difficulty perfroming daily activity                               PT Education - 07/13/19 1040    Education Details  reviewed HEP; reviewed use of HEP to manage pain and improve strength; reviewed HEP for knee activity       PT Short Term Goals - 07/07/19 1827      PT SHORT TERM GOAL #1   Title  Patient will demonstrate 5/5 left hip strength    Time  3    Period  Weeks    Status  On-going    Target Date  07/15/19      PT SHORT TERM GOAL #2   Title  Patient will demonstrate full passive ROM of the hip    Time  3    Period  Weeks    Status  On-going    Target Date  07/15/19        PT Long Term Goals - 06/24/19 1500      PT LONG TERM GOAL #1   Title  Patient will stand for 30 min  without increased pain in order to perfrom ADL's    Time  6    Period  Weeks    Status  New    Target Date  08/05/19      PT LONG TERM GOAL #2   Title  Patient will denostrate a 39% limitation on FOTO in order to demonstrate improved functional ability    Baseline  60% limitation    Time  6    Period  Weeks    Status  New    Target Date  08/05/19            Plan - 07/13/19 1042    Clinical Impression Statement  Patient continues to have difficulty weight bearing on right knee because of pain. This is likley adding to her lateral hip pain. She had dspasming in her IT-band and tenderness to palpation in her lateral hip. She was encouraged to continue suing her tennis ball at home and educatedher on self trigger point release for her IT band. She has crepitus with aptella  mobilization. she reported mild pain with patella mobilization. She was shown how to do for home. She was also given standing weight shift.    Personal Factors and Comorbidities  Comorbidity 1;Comorbidity 3+;Comorbidity 2    Comorbidities  vertigo, frequent falls; left knee pain from a fall; cancer;    Examination-Activity Limitations  Carry;Squat;Locomotion Level;Stand    Examination-Participation Restrictions  Community Activity;Shop;Laundry    Stability/Clinical Decision Making  Evolving/Moderate complexity    Clinical Decision Making  Moderate    Rehab Potential  Good    PT Frequency  2x / week    PT Duration  6 weeks    PT Treatment/Interventions  ADLs/Self Care Home Management;Cryotherapy;Electrical Stimulation;Iontophoresis 4mg /ml Dexamethasone;Traction;Ultrasound;DME Instruction;Gait training;Stair training;Therapeutic exercise;Balance training;Therapeutic activities;Functional mobility training;Patient/family education;Manual techniques;Passive range of motion;Taping    PT Next Visit Plan  inferior and posterior glides of the hip if pain free, soft tissue work to left gluteal and lower back; be aware of vertigo. Begin light hip strengthening if patient can tolerate supine marching and clam shell; consider PPT; progress to standing as tolerated; modalities PRN    PT Home Exercise Plan  LTR; tennis ball trigger point release; piriformis stretch ( already doing at home) seated hamstring stretch    Consulted and Agree with Plan of Care  Patient       Patient will benefit from skilled therapeutic intervention in order to improve the following deficits and impairments:  Abnormal gait, Difficulty walking, Pain, Decreased activity tolerance, Decreased safety awareness, Increased fascial restricitons, Decreased range of motion, Decreased mobility, Decreased endurance, Increased muscle spasms, Improper body mechanics  Visit Diagnosis: Acute left-sided low back pain without sciatica  Other  symptoms and signs involving the musculoskeletal system  Pain in left hip  Other abnormalities of gait and mobility  Problem List Patient Active Problem List   Diagnosis Date Noted  . Knee pain 06/07/2019  . Back pain 06/07/2019  . Hip pain 06/07/2019  . Gastroesophageal reflux disease 04/23/2019  . Obesity (BMI 35.0-39.9 without comorbidity) 04/23/2019  . 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 Depression 04/21/2017  . Cigarette smoker 06/21/2015    Carney Living 07/13/2019, 10:49 AM  [facility] [address] [place], [place], [address] Phone: [phone]   Fax:  [phone]  Name: SONNI VANHOOSIER MRN: [id number] Date of Birth: [birth year]

## 2019-07-19 ENCOUNTER — Other Ambulatory Visit: Payer: Self-pay

## 2019-07-19 ENCOUNTER — Encounter: Payer: Self-pay | Admitting: Physical Therapy

## 2019-07-19 ENCOUNTER — Ambulatory Visit: Payer: 59 | Admitting: Physical Therapy

## 2019-07-19 DIAGNOSIS — M545 Low back pain, unspecified: Secondary | ICD-10-CM

## 2019-07-19 DIAGNOSIS — M25552 Pain in left hip: Secondary | ICD-10-CM

## 2019-07-19 DIAGNOSIS — R29898 Other symptoms and signs involving the musculoskeletal system: Secondary | ICD-10-CM

## 2019-07-20 NOTE — Therapy (Signed)
[place], [place], [address] Phone: [phone]   Fax:  [phone]  Physical Therapy Treatment  Patient Details  Name: Shelley Mason MRN: [id number] Date of Birth: Aug 07, 1965 Referring Provider (PT): Dr Graylin Shiver    Encounter Date: 07/19/2019  PT End of Session - 07/20/19 0921    Visit Number  4    Number of Visits  12    Date for PT Re-Evaluation  08/04/19    Authorization Type  bright health    PT Start Time  1630    PT Stop Time  1712    PT Time Calculation (min)  42 min    Activity Tolerance  Patient tolerated treatment well    Behavior During Therapy  [facility] for tasks assessed/performed       Past Medical History:  Diagnosis Date  . Anemia    history of  . Anxiety   . Cigarette smoker   . Depression   . Fibroids    history of uterine fibroids  . GERD (gastroesophageal reflux disease)   . Hearing loss   . Hearing loss   . History of radiation therapy 09/22/17- 11/06/17   tumor bed left neck with added margin o facial nerve.   . Obstructive sleep apnea   . Primary cancer of parotid gland ([place])   . 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 Vertigo     Past Surgical History:  Procedure Laterality Date  . CESAREAN SECTION     x 3  . COLONOSCOPY    . CYSTOSCOPY    . LAPAROSCOPIC ASSISTED VAGINAL HYSTERECTOMY    . PAROTIDECTOMY Left   . TUBAL LIGATION      There were no vitals filed for this visit.  Subjective Assessment - 07/19/19 1635    Subjective  The patient continues to have significant left knee pain. She report sit was a littlebetter after the last visit but it is hurting her more today. She is still having back pain but that it improving.    Pertinent History  Left knee injury in decemeber; multiple falls; vertigo; radiation  from cancer    How long can you sit comfortably?  sitting for a long period of time causes the back to hurt    How long can you walk comfortably?  Needs rest  after a long period of time    Patient Stated Goals  to be able to return to my normal life    Currently in Pain?  Yes    Pain Score  5     Pain Location  Back    Pain Orientation  Left;Right    Pain Descriptors / Indicators  Aching    Pain Type  Chronic pain    Pain Onset  More than a month ago    Pain Frequency  Intermittent    Aggravating Factors   sitting for long periods of time    Pain Relieving Factors  rest    Effect of Pain on Daily Activities  difficulty perfroming daily activity                        [facility] Adult PT Treatment/Exercise - 07/20/19 0001      Lumbar Exercises: Stretches   Lower Trunk Rotation Limitations  x20     Piriformis Stretch Limitations  2x20 sec hold bilateral       Lumbar Exercises: Standing   Heel Raises Limitations  weight  shift forward 2x10       Lumbar Exercises: Supine   AB Set Limitations  reviewed abdominal breathing     Bent Knee Raise Limitations  x15 with cuing to stabilize the core             PT Education - 07/19/19 1638    Education Details  reviewed tehcnique with ther-ex    Person(s) Educated  Patient    Methods  Explanation;Demonstration;Tactile cues;Verbal cues    Comprehension  Verbalized understanding;Verbal cues required;Returned demonstration;Tactile cues required       PT Short Term Goals - 07/20/19 1438      PT SHORT TERM GOAL #1   Title  Patient will demonstrate 5/5 left hip strength    Time  3    Period  Weeks    Status  On-going    Target Date  07/15/19      PT SHORT TERM GOAL #2   Title  Patient will demonstrate full passive ROM of the hip    Time  3    Period  Weeks    Status  On-going    Target Date  07/15/19        PT Long Term Goals - 06/24/19 1500      PT LONG TERM GOAL #1   Title  Patient will stand for 30 min  without increased pain in order to perfrom ADL's    Time  6    Period  Weeks    Status  New    Target Date  08/05/19      PT LONG TERM GOAL #2   Title   Patient will denostrate a 39% limitation on FOTO in order to demonstrate improved functional ability    Baseline  60% limitation    Time  6    Period  Weeks    Status  New    Target Date  08/05/19            Plan - 07/19/19 1634    Clinical Impression Statement  The patients knee continues to be limiting. Her patella movement has improved significantly. She has more popping in her knee but she was advised this just means her patella is moving. She was given bridegs, marches, and clamshells for home. She had no increase in pain with these activity. Her knee continues to contribute to her anlgic gait. We will continue to work on enitre chain to reduce left hip and lower back pain.    Personal Factors and Comorbidities  Comorbidity 1;Comorbidity 3+;Comorbidity 2    Comorbidities  vertigo, frequent falls; left knee pain from a fall; cancer;    Examination-Activity Limitations  Carry;Squat;Locomotion Level;Stand    Examination-Participation Restrictions  Community Activity;Shop;Laundry    Stability/Clinical Decision Making  Evolving/Moderate complexity    Clinical Decision Making  Moderate    Rehab Potential  Good    PT Frequency  2x / week    PT Duration  6 weeks    PT Treatment/Interventions  ADLs/Self Care Home Management;Cryotherapy;Electrical Stimulation;Iontophoresis 4mg /ml Dexamethasone;Traction;Ultrasound;DME Instruction;Gait training;Stair training;Therapeutic exercise;Balance training;Therapeutic activities;Functional mobility training;Patient/family education;Manual techniques;Passive range of motion;Taping    PT Next Visit Plan  inferior and posterior glides of the hip if pain free, soft tissue work to left gluteal and lower back; be aware of vertigo. Begin light hip strengthening if patient can tolerate supine marching and clam shell; consider PPT; progress to standing as tolerated; modalities PRN    PT Home Exercise Plan  LTR; tennis ball trigger point  release; piriformis stretch  ( already doing at home) seated hamstring stretch    Consulted and Agree with Plan of Care  Patient       Patient will benefit from skilled therapeutic intervention in order to improve the following deficits and impairments:  Abnormal gait, Difficulty walking, Pain, Decreased activity tolerance, Decreased safety awareness, Increased fascial restricitons, Decreased range of motion, Decreased mobility, Decreased endurance, Increased muscle spasms, Improper body mechanics  Visit Diagnosis: Acute left-sided low back pain without sciatica  Other symptoms and signs involving the musculoskeletal system  Pain in left hip     Problem List Patient Active Problem List   Diagnosis Date Noted  . Knee pain 06/07/2019  . Back pain 06/07/2019  . Hip pain 06/07/2019  . Gastroesophageal reflux disease 04/23/2019  . Obesity (BMI 35.0-39.9 without comorbidity) 04/23/2019  . 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 Depression 04/21/2017  . Cigarette smoker 06/21/2015    Carney Living PT DPT  07/20/2019, 2:41 PM  [facility] [address] [place], [place], [address] Phone: [phone]   Fax:  [phone]  Name: Shelley Mason MRN: [id number] Date of Birth: 03/17/1965

## 2019-07-26 ENCOUNTER — Ambulatory Visit: Payer: 59 | Admitting: Physical Therapy

## 2019-07-26 ENCOUNTER — Encounter: Payer: Self-pay | Admitting: Physical Therapy

## 2019-07-26 ENCOUNTER — Other Ambulatory Visit: Payer: Self-pay

## 2019-07-26 DIAGNOSIS — M25552 Pain in left hip: Secondary | ICD-10-CM

## 2019-07-26 DIAGNOSIS — M545 Low back pain, unspecified: Secondary | ICD-10-CM

## 2019-07-26 DIAGNOSIS — R2689 Other abnormalities of gait and mobility: Secondary | ICD-10-CM

## 2019-07-27 ENCOUNTER — Encounter (HOSPITAL_COMMUNITY): Payer: Self-pay | Admitting: Emergency Medicine

## 2019-07-27 ENCOUNTER — Telehealth: Payer: Self-pay | Admitting: *Deleted

## 2019-07-27 ENCOUNTER — Emergency Department (HOSPITAL_COMMUNITY): Payer: 59

## 2019-07-27 DIAGNOSIS — R0789 Other chest pain: Secondary | ICD-10-CM | POA: Diagnosis present

## 2019-07-27 DIAGNOSIS — Z5321 Procedure and treatment not carried out due to patient leaving prior to being seen by health care provider: Secondary | ICD-10-CM | POA: Diagnosis not present

## 2019-07-27 DIAGNOSIS — R0602 Shortness of breath: Secondary | ICD-10-CM | POA: Insufficient documentation

## 2019-07-27 LAB — CBC
HCT: 39.9 % (ref 36.0–46.0)
Hemoglobin: 12.8 g/dL (ref 12.0–15.0)
MCH: 30.3 pg (ref 26.0–34.0)
MCHC: 32.1 g/dL (ref 30.0–36.0)
MCV: 94.3 fL (ref 80.0–100.0)
Platelets: 270 10*3/uL (ref 150–400)
RBC: 4.23 MIL/uL (ref 3.87–5.11)
RDW: 13.9 % (ref 11.5–15.5)
WBC: 10.9 10*3/uL — ABNORMAL HIGH (ref 4.0–10.5)
nRBC: 0 % (ref 0.0–0.2)

## 2019-07-27 LAB — BASIC METABOLIC PANEL
Anion gap: 7 (ref 5–15)
BUN: 12 mg/dL (ref 6–20)
CO2: 28 mmol/L (ref 22–32)
Calcium: 9.1 mg/dL (ref 8.9–10.3)
Chloride: 106 mmol/L (ref 98–111)
Creatinine, Ser: 0.95 mg/dL (ref 0.44–1.00)
GFR calc Af Amer: >60 mL/min (ref >60)
GFR calc non Af Amer: >60 mL/min (ref >60)
Glucose, Bld: 96 mg/dL (ref 70–99)
Potassium: 3.5 mmol/L (ref 3.5–5.1)
Sodium: 141 mmol/L (ref 135–145)

## 2019-07-27 LAB — I-STAT BETA HCG BLOOD, ED (NOT ORDERABLE): I-stat hCG, quantitative: <5 m[IU]/mL (ref <5)

## 2019-07-27 LAB — TROPONIN I (HIGH SENSITIVITY): Troponin I (High Sensitivity): <2 ng/L (ref <18)

## 2019-07-27 MED ORDER — SODIUM CHLORIDE 0.9% FLUSH: 3.0000 mL | Freq: Once | INTRAVENOUS | Status: DC

## 2019-07-27 NOTE — Therapy (Signed)
[place], [place], [address] Phone: [phone]   Fax:  [phone]  Physical Therapy Treatment  Patient Details  Name: Shelley Mason MRN: [id number] Date of Birth: [birth year] Referring Provider (PT): Dr Graylin Shiver    Encounter Date: 07/26/2019  PT End of Session - 07/27/19 1203    Visit Number  5    Number of Visits  12    Date for PT Re-Evaluation  08/04/19    Authorization Type  bright health    PT Start Time  1630    PT Stop Time  1710    PT Time Calculation (min)  40 min    Activity Tolerance  Patient tolerated treatment well    Behavior During Therapy  [facility] for tasks assessed/performed       Past Medical History:  Diagnosis Date  . Anemia    history of  . Anxiety   . Cigarette smoker   . Depression   . Fibroids    history of uterine fibroids  . GERD (gastroesophageal reflux disease)   . Hearing loss   . Hearing loss   . History of radiation therapy 09/22/17- 11/06/17   tumor bed left neck with added margin o facial nerve.   . Obstructive sleep apnea   . Primary cancer of parotid gland ([place])   . 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 Vertigo     Past Surgical History:  Procedure Laterality Date  . CESAREAN SECTION     x 3  . COLONOSCOPY    . CYSTOSCOPY    . LAPAROSCOPIC ASSISTED VAGINAL HYSTERECTOMY    . PAROTIDECTOMY Left   . TUBAL LIGATION      There were no vitals filed for this visit.  Subjective Assessment - 07/26/19 1638    Subjective  Patient reports that the pain over the weekend the pain had significant pain in her knee. She felt a pop in her knee. She had difficulty standing from that point. She hasn't been able to get out of bed.    Pertinent History  Left knee injury in decemeber; multiple falls; vertigo; radiation  from cancer    Limitations  Standing;Walking    How long can you sit comfortably?  sitting for a long period of time causes the back to hurt    How long  can you walk comfortably?  Needs rest after a long period of time    Patient Stated Goals  to be able to return to my normal life    Currently in Pain?  Yes    Pain Score  8     Pain Location  Knee    Pain Orientation  Left;Right    Pain Descriptors / Indicators  Aching    Pain Type  Chronic pain    Pain Onset  More than a month ago    Pain Frequency  Intermittent    Aggravating Factors   sitting for long periods of time    Pain Relieving Factors  rest    Effect of Pain on Daily Activities  difficulty perfroming ADL's                        [facility] Adult PT Treatment/Exercise - 07/27/19 0001      Ambulation/Gait   Gait Comments  At this point when the patient is walking she is having some hyper extension in her knee. She is also shifting weight laterally off her  knee which is likley contributing to her back and hip pain. Therapy worked on gait with a cane. She required mod cuing at first but with practice she had less hyperextension and a smoother gait pattern. She was adivsed she needs       Self-Care   Self-Care  Other Self-Care Comments    Other Self-Care Comments   reviewed RICE to reduce inflammation. Reviewed activity modifiaction untilher pain reduces. Advised not to just lie in bed.       Lumbar Exercises: Stretches   Lower Trunk Rotation Limitations  x20     Piriformis Stretch Limitations  2x20 sec hold bilateral       Lumbar Exercises: Supine   Other Supine Lumbar Exercises  quad set 2x10 5 sec hold      Manual Therapy   Joint Mobilization  gentle PA and AP glides to the right knee grade i and II for pain    Soft tissue mobilization  to left IT band and left upper gluteal     Manual Traction  to left leg patient reported a signficant improvement in pain in her hip.              PT Education - 07/27/19 1201    Education Details  reviewed RICE to decrease pain and i nflammation. Encouraged to return to MD for re-eval    Person(s) Educated  Patient     Methods  Explanation;Demonstration;Tactile cues;Verbal cues    Comprehension  Verbalized understanding;Returned demonstration;Verbal cues required;Tactile cues required       PT Short Term Goals - 07/27/19 1213      PT SHORT TERM GOAL #1   Title  Patient will demonstrate 5/5 left hip strength    Time  3    Period  Weeks    Status  On-going    Target Date  07/15/19      PT SHORT TERM GOAL #2   Title  Patient will demonstrate full passive ROM of the hip    Time  3    Period  Weeks    Status  On-going    Target Date  07/15/19        PT Long Term Goals - 06/24/19 1500      PT LONG TERM GOAL #1   Title  Patient will stand for 30 min  without increased pain in order to perfrom ADL's    Time  6    Period  Weeks    Status  New    Target Date  08/05/19      PT LONG TERM GOAL #2   Title  Patient will denostrate a 39% limitation on FOTO in order to demonstrate improved functional ability    Baseline  60% limitation    Time  6    Period  Weeks    Status  New    Target Date  08/05/19            Plan - 07/27/19 1206    Clinical Impression Statement  Patients knee is lmiting patient at this time. She has significantly high pain levels in her knee and crepitus with mvement. She had improved hip and back pain with manual therapy and a slight improvement in knee pain. Therapy reviewed gait training with a cane. She is hyper extensing when she is walking. She was advised ac ane will help until she can be evaluated. therapy also reviewed rice with the patient.Patient will be put on hold for now.  Personal Factors and Comorbidities  Comorbidity 1;Comorbidity 3+;Comorbidity 2    Comorbidities  vertigo, frequent falls; left knee pain from a fall; cancer;    Examination-Activity Limitations  Carry;Squat;Locomotion Level;Stand    Examination-Participation Restrictions  Community Activity;Shop;Laundry    Stability/Clinical Decision Making  Evolving/Moderate complexity    Clinical  Decision Making  Moderate    Rehab Potential  Good    PT Frequency  2x / week    PT Duration  6 weeks    PT Treatment/Interventions  ADLs/Self Care Home Management;Cryotherapy;Electrical Stimulation;Iontophoresis 4mg /ml Dexamethasone;Traction;Ultrasound;DME Instruction;Gait training;Stair training;Therapeutic exercise;Balance training;Therapeutic activities;Functional mobility training;Patient/family education;Manual techniques;Passive range of motion;Taping    PT Next Visit Plan  inferior and posterior glides of the hip if pain free, soft tissue work to left gluteal and lower back; be aware of vertigo. Begin light hip strengthening if patient can tolerate supine marching and clam shell; consider PPT; progress to standing as tolerated; modalities PRN    PT Home Exercise Plan  LTR; tennis ball trigger point release; piriformis stretch ( already doing at home) seated hamstring stretch       Patient will benefit from skilled therapeutic intervention in order to improve the following deficits and impairments:  Abnormal gait, Difficulty walking, Pain, Decreased activity tolerance, Decreased safety awareness, Increased fascial restricitons, Decreased range of motion, Decreased mobility, Decreased endurance, Increased muscle spasms, Improper body mechanics  Visit Diagnosis: Acute left-sided low back pain without sciatica  Pain in left hip  Other abnormalities of gait and mobility     Problem List Patient Active Problem List   Diagnosis Date Noted  . Knee pain 06/07/2019  . Back pain 06/07/2019  . Hip pain 06/07/2019  . Gastroesophageal reflux disease 04/23/2019  . Obesity (BMI 35.0-39.9 without comorbidity) 04/23/2019  . 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 Depression 04/21/2017  . Cigarette smoker 06/21/2015    Carney Living PT DPT  07/27/2019, 12:18 PM  [facility] [address] [place], [place], [address] Phone:  [phone]   Fax:  [phone]  Name: Shelley Mason MRN: [id number] Date of Birth: 08/25/1965

## 2019-07-27 NOTE — ED Triage Notes (Signed)
Patient here from home with complaints of chest pain and SOB after getting 1st COVID vaccine.

## 2019-07-27 NOTE — Telephone Encounter (Signed)
-----   Message from Martyn Malay, MD sent at 07/26/2019  5:25 PM EDT ----- Regarding: Follow up Hi Red Team,  Please schedule this patient for follow up with me or access to care  for knee pain in the next 1-2 weeks.  Thanks, Dorris Singh, MD  [facility] Medicine Teaching Service

## 2019-07-27 NOTE — Telephone Encounter (Signed)
Pt informed and scheduled. Deshonna Trnka, CMA  

## 2019-07-28 ENCOUNTER — Emergency Department (HOSPITAL_COMMUNITY)
Admission: EM | Admit: 2019-07-28 | Discharge: 2019-07-28 | Disposition: A | Discharge status: Home / Self Care | Payer: 59 | Attending: Emergency Medicine | Admitting: Emergency Medicine

## 2019-07-29 ENCOUNTER — Telehealth: Payer: Self-pay

## 2019-07-29 ENCOUNTER — Encounter: Payer: Self-pay | Admitting: Family Medicine

## 2019-07-29 ENCOUNTER — Telehealth: Payer: Self-pay | Admitting: Family Medicine

## 2019-07-29 NOTE — Telephone Encounter (Signed)
Called patient. Generic voicemail left.  If patient calls back please advise - She should be seen by a provider for her cough and sputum, may need COVID testing. Can got to Urgent Care or return to ED given symptoms.  - Chest x-ray was normal.  - Labs all looked good--kidney function and blood count were good  Please let me know if she has questions  Dorris Singh, MD  [facility] Medicine Teaching Service

## 2019-07-29 NOTE — Telephone Encounter (Signed)
Patient calls nurse line following up on ED visit from yesterday. Patient went to the ED with [facility] and chest pain. Patient reports that she left without being seen, after waiting for eight hours. Patient reports that she is not currently experiencing chest pain or shortness of breath. However, she does report blood tinged mucus. She does report pain in left side when she begins coughing. Patient states that she feels like she has extra fluid in her chest. Patient received first COVID vaccine ([employer]), Friday 07/23/19 and is concerned that this could be related.   Patient is able to speak in complete sentences and is not showing signs of acute distress.   Will discuss with PCP  ED precautions given in the meantime.    Please advise  Talbot Grumbling, RN

## 2019-07-29 NOTE — Telephone Encounter (Signed)
Patient called after hours after missing a call from Dr. Owens Shark.  I read her Dr. Saul Fordyce message that she wanted to let patient know about.  She did not have any further questions.  Wilber Oliphant, M.D.  6:18 PM 07/29/2019

## 2019-08-11 ENCOUNTER — Ambulatory Visit
Admission: RE | Admit: 2019-08-11 | Discharge: 2019-08-11 | Disposition: A | Discharge status: Home / Self Care | Payer: 59 | Source: Ambulatory Visit | Attending: Family Medicine | Admitting: Family Medicine

## 2019-08-11 ENCOUNTER — Other Ambulatory Visit: Payer: Self-pay | Admitting: Family Medicine

## 2019-08-11 ENCOUNTER — Encounter: Payer: Self-pay | Admitting: Family Medicine

## 2019-08-11 ENCOUNTER — Other Ambulatory Visit: Payer: Self-pay

## 2019-08-11 DIAGNOSIS — N6310 Unspecified lump in the right breast, unspecified quadrant: Secondary | ICD-10-CM

## 2019-08-11 DIAGNOSIS — R921 Mammographic calcification found on diagnostic imaging of breast: Secondary | ICD-10-CM

## 2019-08-16 ENCOUNTER — Ambulatory Visit (INDEPENDENT_AMBULATORY_CARE_PROVIDER_SITE_OTHER): Payer: 59 | Admitting: Family Medicine

## 2019-08-16 ENCOUNTER — Encounter: Payer: Self-pay | Admitting: Family Medicine

## 2019-08-16 ENCOUNTER — Other Ambulatory Visit: Payer: Self-pay

## 2019-08-16 VITALS — BP 99/70 | HR 97 | Ht 64.5 in | Wt 204.0 lb

## 2019-08-16 DIAGNOSIS — H814 Vertigo of central origin: Secondary | ICD-10-CM

## 2019-08-16 DIAGNOSIS — C07 Malignant neoplasm of parotid gland: Secondary | ICD-10-CM

## 2019-08-16 DIAGNOSIS — Z111 Encounter for screening for respiratory tuberculosis: Secondary | ICD-10-CM

## 2019-08-16 DIAGNOSIS — F329 Major depressive disorder, single episode, unspecified: Secondary | ICD-10-CM

## 2019-08-16 DIAGNOSIS — Z716 Tobacco abuse counseling: Secondary | ICD-10-CM

## 2019-08-16 DIAGNOSIS — H9192 Unspecified hearing loss, left ear: Secondary | ICD-10-CM

## 2019-08-16 DIAGNOSIS — M25562 Pain in left knee: Secondary | ICD-10-CM

## 2019-08-16 DIAGNOSIS — R42 Dizziness and giddiness: Secondary | ICD-10-CM | POA: Diagnosis not present

## 2019-08-16 DIAGNOSIS — H8190 Unspecified disorder of vestibular function, unspecified ear: Secondary | ICD-10-CM | POA: Insufficient documentation

## 2019-08-16 DIAGNOSIS — Z113 Encounter for screening for infections with a predominantly sexual mode of transmission: Secondary | ICD-10-CM | POA: Diagnosis not present

## 2019-08-16 DIAGNOSIS — F32A Depression, unspecified: Secondary | ICD-10-CM

## 2019-08-16 DIAGNOSIS — F17210 Nicotine dependence, cigarettes, uncomplicated: Secondary | ICD-10-CM

## 2019-08-16 DIAGNOSIS — H8192 Unspecified disorder of vestibular function, left ear: Secondary | ICD-10-CM

## 2019-08-16 MED ORDER — MECLIZINE HCL 25 MG PO TABS: 25.0000 mg | ORAL_TABLET | Freq: Three times a day (TID) | ORAL | 0 refills | Status: DC | PRN

## 2019-08-16 MED ORDER — DICLOFENAC SODIUM 1 % EX GEL: CUTANEOUS | 1 refills | Status: DC

## 2019-08-16 MED ORDER — CYCLOBENZAPRINE HCL 5 MG PO TABS: 5.0000 mg | ORAL_TABLET | Freq: Three times a day (TID) | ORAL | 0 refills | Status: DC | PRN

## 2019-08-16 MED ORDER — SODIUM FLUORIDE 1.1 % DT CREA: TOPICAL_CREAM | DENTAL | 3 refills | Status: DC

## 2019-08-16 MED ORDER — NAPROXEN 500 MG PO TABS: 500.0000 mg | ORAL_TABLET | Freq: Two times a day (BID) | ORAL | 0 refills | Status: DC | PRN

## 2019-08-16 MED ORDER — NICOTINE 21 MG/24HR TD PT24: 21.0000 mg | MEDICATED_PATCH | Freq: Every day | TRANSDERMAL | 0 refills | Status: DC

## 2019-08-16 NOTE — Patient Instructions (Addendum)
It was wonderful to see you today.  Please bring ALL of your medications with you to every visit.   Today we talked about:  1. An MRI of your brain--- I will order this later tday and call you to schedule.   2. Lab work---- I will call you with results   3. I sent a prescription for voltaren gel   4. I sent in for nicotine patches--- apply to your upper arm during the day every day starting on Thursday  5. Call one of the counselors on [email or web address]   I will call you next week to check in  [place]   Psychologists---go to Psychologytoday.com---they will have a LIST of providers that take your insurance    Therapy and Counseling Resources Most providers on this list will take Medicaid. Patients with commercial insurance or Medicare should contact their insurance company to get a list of in network providers.  [employer]  [address], [place], [place] [address]      [place] [address]  [place], [place] [address] [phone]  [place] [address] [place]  [place], [place] [address]       [phone]      Jinny Blossom [facility] [address] [place], [place]  [facility]:  [place]. [place]  [facility] Counseling:  [place] [address] Loni Muse, [place]  [facility] (under & uninsured) [address], [place] [phone]    [email or web address] [place]     Phone:  [phone]     Madeira [place]  Kinde [address]      [place], [place] [address] [place]: [place], [place], [place]   [place] ([place] therapist) [address] [place] [address]   [place] [address]    [phone]    [facility]   [place],  [place], [place]   [place]  Opp  [facility]  [address] [place], [place]        [phone]  Open Access/Walk In Clinic under & uninsured  [place], To schedule an appointment call [phone] [address] [place] [phone]):  Fay Records from 8 AM - 3 PM Moving June 1 to [employer] at [facility] [address] [place],  ([place])   [place], [place]: [phone]) 8:30 - 12; 1 - 2:30  [facility] of the [place],  [place], [place]    ([phone]):8:30 - 12; 2 - 3PM  [facility] [place],  [address]  [place]; [phone]):   Mon - Fri 8 AM - 5 PM  [facility] [place]  MWF 12:30 to 3:00 or call to schedule an appointment  [phone]  Specific Provider options Psychology Today  [email or web address]/us 1. click on find a therapist  2. enter your zip code 3. left side and select or tailor a therapist for your specific need.   [employer]  Center Provider Directory [email or web address]  (Medicaid)   Follow all drop down to find a provider  [place] [phone] or [email or web address] [address], [place], [place] Recovery support and educational   24- Hour Availability:  . [place] or [phone]  . Family Service of the [employer] [phone]  [facility] Crisis Service  [phone]   . [place]  [phone] (after hours)  . Therapeutic Alternative/Mobile Crisis   [phone]  . [place] National Suicide Hotline  (623)484-1174 ([place])  . Call 911 or go to emergency room  . [employer]  [phone]);  [place] and [employer]   . [facility] ACCESS  [phone]); [place], [place], [place], [place], [place], [place], [place]    I will call you this week with results  Thank you for choosing [facility].   Please call [phone] with any questions about today's appointment.  Please be sure to schedule follow up at the front  desk before you leave today.   Shelley Singh, MD  Family Medicine   Tobacco use is damaging to your body. It increases your risk of stroke, heart attack, lung cancer, and serious lung disease in the future. It also reduces your fertility.   Quitting tobacco is the best thing for your health but is a challenge---nicotine, a chemical in cigarettes, is highly addictive.   You can call 1 800 QUIT NOW (1-(810) [phone])---you will be connected with a Artist. They can also mail you nicotine gums, lozenges, and patches to quit.   Ask me about patches (which you wear all day) and gums (which you use when you have a craving) to help you quit.   There are safe, effective medications to help you quit--  Varencline---also called Chantix---- is the most common medication used to help people stop smoking. It starts a low dose and is increased. I recommended choosing a quit date then starting the medication 8 days before this. Side effects include mild headache, difficulty sleeping, and odd dreams. The medication is typically very well tolerated.     Bupropion---also called Zyban---- is started 1 week before your quit date. You take 1 pill for three days then increase to 1 pill twice per day. Side effects include a mild headache and anxiety---this usually goes away. Some patients experience weight loss.

## 2019-08-16 NOTE — Addendum Note (Signed)
Addended by: Owens Shark, Xayden Linsey on: 08/16/2019 04:17 PM   Modules accepted: Orders

## 2019-08-16 NOTE — Assessment & Plan Note (Signed)
Counseled about quitting. Discussed strategies. Rx for 21 mcg patch, will taper to 14 mcg as able. Follow up next week.

## 2019-08-16 NOTE — Progress Notes (Signed)
Patient given PHQ2 and 9.  Provider and student aware.  Shelley Mason, [place]

## 2019-08-16 NOTE — Progress Notes (Signed)
SUBJECTIVE:   CHIEF COMPLAINT / HPI:   Shelley Mason is a 54 y.o. female with a past medical history of tobacco abuse,  depression, and parotid cancer s/p resection and radiation who presents today with depression and pain of the left knee. Patient seen with Student Doctor Mabe.   Mood The patient has been battling a low mood for several months.  Prior to her diagnosis of parotid cancer she was a Pharmacist, hospital, worked a Retail buyer job, and took care of her family.  Since her radiation and surgery she has had persistent pain, vertigo and anxiety which limit her ability to function.  She reports she recently had incredibly stressful episode with Dr. Lucia Gaskins at ENT.  She reports she felt he discounted her symptoms and told her she did not have vertigo and that her symptoms were 'not real'. She reports a low mood and poor sleep. Wellbutrin is helping. Her counselor finished her internship and Ms. Hebb is no longer seeing a therapist. She is interested in counseling.  She reports intermittent low mood and thoughts of hurting herself.  She has no specific plan.  She has never tried to hurt herself.  She has no prior hospitalizations.  She does not have access to gun.  She looks forward to spending time with her family and seeing her grandkids and children prevent herself from acting out this way.  She is looking forward to volunteering on Monday.   She has noticed a change in her attention and memory.  She reports she forgot to get off the exit to her high school and recently left water boiling for prolonged period of time.  She feels like she is a lot going on and simply cannot focus. She is very worried she has dementia. No family history of early memory impairment.    Knee pain The patient reports left knee pain since March. She had a fall after tripping episode in March.  She endorses some clicking.  She denies locking, catching or popping.  She denies recurrence of falls.  She denies numbness or  tingling.  No swelling.  She is using Naprosyn once a week and intermittently uses Tylenol.  She is interested in topical therapy for this.  She would like to able to get back to physical therapy.  Tobacco Use Previously quit after using Wellbutrin. She is interested in starting patches. Smoking 1 ppd. Does not wake up to smoke. Identifies grandkids at motivator. Trigger for recurrence was stressful ENT visit per patient.   Need for PPD IGRA ordered today--- she is planning on volunteering at her church starting on Monday.   PERTINENT  PMH / PSH:  Depression Obesity Parotid tumor s/p radiation and resectin Tobacco abuse- previously quit attempts X2.   OBJECTIVE:   BP 99/70   Pulse 97   Ht 5' 4.5" (1.638 m)   Wt 204 lb (92.5 kg)   SpO2 100%   BMI 34.48 kg/m   HEENT: Notable for poor dentition.  Asymmetry in the left face as compared to the right cranial nerves tested today. CNII-XII tested cranial nerve V in the V2/3  Cardiac: Regular rate and rhythm. Normal S1/S2. No murmurs, rubs, or gallops appreciated. Lungs: Clear bilaterally to ascultation.  MSK  favors right leg with stepping.  Right knee is unremarkable no tenderness to palpation.  There is no erythema or edema.  Left knee without erythema or swelling.  There does not appear to be an effusion.  Tenderness to palpation along  medial joint line of left knee.  There is some clicking but not catching or locking with range of motion.  She is full range of motion preserved strength. + thessaly  Full ROM of bilateral hips   ASSESSMENT/PLAN:   Depression Wellbutrin is helping. Recent stressors in life with ENT appointment, loss of job, loss of counselor. Information given about counselors.   Suicide hotline information given. See above information for details.  Looking forward to working at [employer] addition of SSRI in future. Discussed going to Psychologytoday.com---looked at site with patient (has [facility]) Reminder to self to follow up on Tuesday  Cigarette smoker Counseled about quitting. Discussed strategies. Rx for 21 mcg patch, will taper to 14 mcg as able. Follow up next week. 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 Most likely related to radiation but underlying secondary carcinoma or benign lesion (Schwannoma) must be considered given age and lack of improvement. Continues to cause debilitating symptoms.  Persistence of vertigo. Symptoms are severe and limit ability to function. She has associated headaches and these symptoms have not improved despite PT.  Given risk factors, will proceed with neuroimaging with MRi Will plan for referral back to vestibular rehab.  Knee pain Acute left knee pain after fall.  Exam is most consistent with meniscal pathology.  Likely has underlying osteoarthritis.  Referral placed for physical therapy.  Also given Voltaren gel.  She can use naproxen sparingly given her history of elevated blood pressures and elevated creatinine. Referral to PT pending  Xray  Memory Concerns, likely related to anxiety/depression. Symptoms most consistent with inattention rather than memory---will evaluate for secondary causes with TSH, HIV, RPR, B12.   HCM Congratulated on getting COVID vaccine    Martyn Malay, MD [place]

## 2019-08-16 NOTE — Assessment & Plan Note (Signed)
Persistence of vertigo. Symptoms are severe and limit ability to function. She has associated headaches and these symptoms have not improved despite PT.  Given risk factors, will proceed with neuroimaging. Will plan for referral back to vestibular rehab.

## 2019-08-16 NOTE — Assessment & Plan Note (Signed)
Wellbutrin is helping. Recent stressors in life with ENT appointment, loss of job, loss of counselor. Information given about counselors.  Suicide hotline information given. See above information for details.

## 2019-08-16 NOTE — Assessment & Plan Note (Addendum)
Acute left knee pain after fall.  Exam is most consistent with meniscal pathology.  Likely has underlying osteoarthritis.  Referral placed for physical therapy.  Also given Voltaren gel.  She can use naproxen sparingly given her history of elevated blood pressures (normal today) Cr is normal.

## 2019-08-18 ENCOUNTER — Telehealth: Payer: Self-pay | Admitting: *Deleted

## 2019-08-18 DIAGNOSIS — K047 Periapical abscess without sinus: Secondary | ICD-10-CM

## 2019-08-18 LAB — QUANTIFERON-TB GOLD PLUS
QuantiFERON Mitogen Value: >10 IU/mL
QuantiFERON Nil Value: 0.04 IU/mL
QuantiFERON TB1 Ag Value: 0.09 IU/mL
QuantiFERON TB2 Ag Value: 0.07 IU/mL
QuantiFERON-TB Gold Plus: NEGATIVE

## 2019-08-18 LAB — TSH: TSH: 1.51 u[IU]/mL (ref 0.450–4.500)

## 2019-08-18 LAB — VITAMIN B12: Vitamin B-12: 467 pg/mL (ref 232–1245)

## 2019-08-18 LAB — HIV ANTIBODY (ROUTINE TESTING W REFLEX): HIV Screen 4th Generation wRfx: NONREACTIVE

## 2019-08-18 LAB — RPR: RPR Ser Ql: NONREACTIVE

## 2019-08-18 NOTE — Telephone Encounter (Signed)
I checked [place] for the year. It appears no toothpaste is covered on their formulary. If patient knows of specific covered toothpaste, I am more than happy to prescribe.Please call her and let her know above.   Thank you, Dorris Singh, MD  [facility] Medicine Teaching Service

## 2019-08-18 NOTE — Telephone Encounter (Signed)
The toothpaste that was sent in is not covered by her insurance.  She would like Dr. Owens Shark to call in something else.  To PCP.  Christen Bame, CMA

## 2019-08-20 MED ORDER — CLINDAMYCIN HCL 300 MG PO CAPS: 300.0000 mg | ORAL_CAPSULE | Freq: Three times a day (TID) | ORAL | 0 refills | Status: DC

## 2019-08-20 NOTE — Telephone Encounter (Signed)
Called patient to discuss concerns.  She has a history of multiple dental abscesses and history of radiation to the area.  She is reports development of an abscess over her anterior right-sided incisor.  She has associated swelling redness and some drainage.  She denies fevers.  She has significant pain.  Discussed return precautions.  Recommended seeing a dentist as soon as possible.  Given list we can prescribe short course of clindamycin.  Discussed return precautions and side effects of this medication at length.  She has an allergic reaction of anaphylaxis to penicillin.  Also discussed follow-up results.  She is going to print off TB skin test.  MRI has not yet been scheduled.   Nursing- can you please call patient and help schedule MRI?    Thank you, Dorris Singh, MD  [facility] Medicine Teaching Service

## 2019-08-20 NOTE — Telephone Encounter (Signed)
Spoke to patient and she does not know of a toothpaste that will be covered.  The last tube that she had was Denta 5000 plus 1.1 %.  Patient states that she is developing an abscess and she needs an antibiotic.  Patient would like for it to be sent in to pharmacy today if possible.  Shelley Mason, [place]

## 2019-08-20 NOTE — Addendum Note (Signed)
Addended by: Owens Shark, [place] on: 08/20/2019 04:52 PM   Modules accepted: Orders

## 2019-08-24 NOTE — Telephone Encounter (Signed)
Cant schedule without a PA, Kennyth Lose will start the process sometime today. Michela Pitcher it takes 3 days due to her insurance. Latandra Loureiro Kennon Holter, CMA

## 2019-08-25 ENCOUNTER — Telehealth: Payer: Self-pay

## 2019-08-25 NOTE — Telephone Encounter (Signed)
Called patient to inform her of upcoming MRI and she states that she has MY CHART and will view it from there.  Shelley Mason, [place]

## 2019-08-26 ENCOUNTER — Telehealth: Payer: Self-pay | Admitting: Family Medicine

## 2019-08-26 NOTE — Telephone Encounter (Signed)
LVM for patient to call office and set up a virtual appointment if she is available in order to provide more information for upcoming MRI.  Ozella Almond, [place]

## 2019-08-26 NOTE — Telephone Encounter (Signed)
Called patient--cannot be seen tomorrow due to another appointment. Will discuss PA with Kennyth Lose.  Dorris Singh, MD  Family Medicine Teaching Service

## 2019-08-26 NOTE — Telephone Encounter (Signed)
Insurance is requiring additional information for MRI. Nursing- can you please call patient and see if she is available tomorrow at 1030 for VIRTUAL appointment? Doesn't need to be seen--I just have to ask additional questions.  Thanks,  Dorris Singh, MD  [facility] Medicine Teaching Service

## 2019-09-09 ENCOUNTER — Ambulatory Visit (HOSPITAL_COMMUNITY): Payer: 59

## 2019-09-17 ENCOUNTER — Ambulatory Visit (HOSPITAL_COMMUNITY)
Admission: RE | Admit: 2019-09-17 | Discharge: 2019-09-17 | Disposition: A | Discharge status: Home / Self Care | Payer: 59 | Source: Ambulatory Visit | Attending: Family Medicine | Admitting: Family Medicine

## 2019-09-17 ENCOUNTER — Other Ambulatory Visit: Payer: Self-pay

## 2019-09-17 DIAGNOSIS — H814 Vertigo of central origin: Secondary | ICD-10-CM | POA: Diagnosis present

## 2019-09-17 DIAGNOSIS — H9192 Unspecified hearing loss, left ear: Secondary | ICD-10-CM | POA: Diagnosis present

## 2019-09-20 ENCOUNTER — Other Ambulatory Visit: Payer: Self-pay

## 2019-09-20 ENCOUNTER — Encounter: Payer: Self-pay | Admitting: Family Medicine

## 2019-09-20 ENCOUNTER — Ambulatory Visit (INDEPENDENT_AMBULATORY_CARE_PROVIDER_SITE_OTHER): Payer: 59 | Admitting: Family Medicine

## 2019-09-20 ENCOUNTER — Ambulatory Visit: Payer: 59 | Admitting: Family Medicine

## 2019-09-20 VITALS — BP 125/80 | HR 93 | Ht 65.5 in | Wt 202.4 lb

## 2019-09-20 DIAGNOSIS — F17210 Nicotine dependence, cigarettes, uncomplicated: Secondary | ICD-10-CM

## 2019-09-20 DIAGNOSIS — F331 Major depressive disorder, recurrent, moderate: Secondary | ICD-10-CM

## 2019-09-20 DIAGNOSIS — Z0000 Encounter for general adult medical examination without abnormal findings: Secondary | ICD-10-CM | POA: Insufficient documentation

## 2019-09-20 DIAGNOSIS — H8192 Unspecified disorder of vestibular function, left ear: Secondary | ICD-10-CM

## 2019-09-20 MED ORDER — FLUOXETINE HCL 10 MG PO CAPS: 10.0000 mg | ORAL_CAPSULE | Freq: Every day | ORAL | 1 refills | Status: DC

## 2019-09-20 NOTE — Assessment & Plan Note (Signed)
Congratulated on change, discussed strategies to avoid smoking rather than reaching for cigarettes. Discussed other options.

## 2019-09-20 NOTE — Patient Instructions (Addendum)
It was wonderful to see you today.  Please bring ALL of your medications with you to every visit.   Today we talked about:  - Take Prozac each morning   - I will discuss tehrapy with Dr. Hartford Poli  - Call [place] therapists on [email or web address]  - Go obtain your knee x-ray  GREAT WORK ON SMOKING CESSATION   Follow up in 4 weeks    Thank you for choosing [facility].   Please call [phone] with any questions about today's appointment.  Please be sure to schedule follow up at the front  desk before you leave today.   Dorris Singh, MD  Family Medicine

## 2019-09-20 NOTE — Assessment & Plan Note (Signed)
Reports dizziness minimally improved--- worsens with flares of anxiety.

## 2019-09-20 NOTE — Assessment & Plan Note (Signed)
Has had benefit from Wellbutrin. Denies side effects.  Depression has anxiety component, which triggers vestibular symptoms. Discussed at length Plan: - Patient to call [employer] health covered providers this week - Will consider referral internally to Ms. Moore to help in care coordination - Start fluoxetine 10 mg, discussed side effects-- reviewed common side effects

## 2019-09-20 NOTE — Progress Notes (Signed)
    SUBJECTIVE:   CHIEF COMPLAINT / HPI:   Shelley Mason is a pleasant 54 year old woman with history significant for parotid gland tumor status post resection and radiation, resulting severe vertigo, tobacco abuse, and depression with anxiety presenting today for follow-up.  Patient reports in terms of her tobacco abuse she is down 5 to 6 cigarettes a day.  She is pleased with her progress.  She reports her trigger is often her anxiety.  The patient reports that her depression is improved.  She feels better than she has in several months.  She reports her main symptom at this time is anxiety.  She has poor sleep associated with this.  She finds that she either reaches for snacks or cigarettes when she is stressed.  She is reach out to several therapists but but unable to establish care.  She does find considerable benefit from the Wellbutrin.  She denies side effects from this.  She is interested in seeing a therapist.  She denies a history of bipolar disorder.  She has previously been on both Zoloft and Prozac independently and tolerated both.  The patient reports her chewing and dysphagia symptoms are improved.  The patient reports that her dizziness is overall slightly improved.  She is pleased to hear the results of her normal MRI today.  Reviewed this in detail.   PERTINENT  PMH / PSH/Family/Social History : Reviewed and updated as appropriate.  At her last visit she had knee pain.  She is going to get her x-rays today she thinks.  OBJECTIVE:   BP 125/80   Pulse 93   Ht 5' 5.5" (1.664 m)   Wt 202 lb 6.4 oz (91.8 kg)   SpO2 98%   BMI 33.17 kg/m   HEENT: Sclera anicteric. Dentition is poor, asymmetry to face. Appears well hydrated. Neck: Supple Cardiac: Regular rate and rhythm. Normal S1/S2. No murmurs, rubs, or gallops appreciated. Lungs: Clear bilaterally to ascultation.   Extremities: Warm, well perfused without edema.  Skin: Warm, dry Psych: Pleasant and appropriate     ASSESSMENT/PLAN:   Cigarette smoker Congratulated on change, discussed strategies to avoid smoking rather than reaching for cigarettes. Discussed other options.   Vestibular disorder Reports dizziness minimally improved--- worsens with flares of anxiety.   Depression Has had benefit from Wellbutrin. Denies side effects.  Depression has anxiety component, which triggers vestibular symptoms. Discussed at length Plan: - Patient to call [employer] health covered providers this week - Will consider referral internally to Ms. Moore to help in care coordination - Start fluoxetine 10 mg, discussed side effects-- reviewed common side effects      HCM - [facility] at [facility] normal due in 2030 - UTD on Mammogram, CSY - We discussed healthy eating habits, moderate physical activity for 30 minutes 5 times per week (or 150 minutes), safe sex practices, avoiding tobacco products, safe alcohol consumption, and safe driving habits.   Dorris Singh, [place]

## 2019-09-22 ENCOUNTER — Telehealth: Payer: Self-pay | Admitting: Family Medicine

## 2019-09-22 ENCOUNTER — Encounter: Payer: Self-pay | Admitting: Family Medicine

## 2019-09-22 NOTE — Telephone Encounter (Signed)
Left generic voicemail about visit follow up. Sent detailed mychart. If patient calls back, please ask her to view mychart message or I will call her Friday PM.   Thanks! CB

## 2019-09-23 ENCOUNTER — Ambulatory Visit
Admission: RE | Admit: 2019-09-23 | Discharge: 2019-09-23 | Disposition: A | Discharge status: Home / Self Care | Payer: 59 | Source: Ambulatory Visit | Attending: Family Medicine | Admitting: Family Medicine

## 2019-09-23 DIAGNOSIS — M25562 Pain in left knee: Secondary | ICD-10-CM

## 2019-09-27 ENCOUNTER — Ambulatory Visit: Payer: 59 | Admitting: Physical Therapy

## 2019-09-28 ENCOUNTER — Other Ambulatory Visit: Payer: Self-pay

## 2019-09-28 ENCOUNTER — Telehealth: Payer: Self-pay | Admitting: Family Medicine

## 2019-09-28 DIAGNOSIS — K219 Gastro-esophageal reflux disease without esophagitis: Secondary | ICD-10-CM

## 2019-09-28 DIAGNOSIS — R1319 Other dysphagia: Secondary | ICD-10-CM

## 2019-09-28 MED ORDER — OMEPRAZOLE 40 MG PO CPDR: 40.0000 mg | DELAYED_RELEASE_CAPSULE | Freq: Every day | ORAL | 3 refills | Status: DC

## 2019-09-28 NOTE — Telephone Encounter (Signed)
Left generic voicemail about knee x-ray.  Shows mild arthritis.  Recommend attending PT (referral previously placed) and continued exercise. Could consider injection in future (would recommend PT first).   Will send message via [place].   Dorris Singh, MD  Family Medicine Teaching Service

## 2019-09-30 ENCOUNTER — Encounter: Payer: Self-pay | Admitting: Physical Therapy

## 2019-09-30 ENCOUNTER — Ambulatory Visit: Payer: 59 | Attending: Family Medicine | Admitting: Physical Therapy

## 2019-09-30 ENCOUNTER — Other Ambulatory Visit: Payer: Self-pay

## 2019-09-30 DIAGNOSIS — M25562 Pain in left knee: Secondary | ICD-10-CM | POA: Insufficient documentation

## 2019-09-30 DIAGNOSIS — G8929 Other chronic pain: Secondary | ICD-10-CM | POA: Diagnosis present

## 2019-09-30 DIAGNOSIS — R262 Difficulty in walking, not elsewhere classified: Secondary | ICD-10-CM | POA: Diagnosis present

## 2019-10-01 NOTE — Therapy (Signed)
[place], [place], [address] Phone: [phone]   Fax:  [phone]  Physical Therapy Evaluation  Patient Details  Name: Shelley Mason MRN: [id number] Date of Birth: 08-Jan-1966 Referring Provider (PT): Dorris Singh, MD   Encounter Date: 09/30/2019   PT End of Session - 09/30/19 1712    Visit Number 1    Number of Visits 9    Date for PT Re-Evaluation 11/13/19    Authorization Type [employer]    PT Start Time 1709    PT Stop Time 1745    PT Time Calculation (min) 36 min    Activity Tolerance Patient tolerated treatment well    Behavior During Therapy [facility] for tasks assessed/performed           Past Medical History:  Diagnosis Date  . Anemia    history of  . Anxiety   . Cigarette smoker   . Depression   . Fibroids    history of uterine fibroids  . GERD (gastroesophageal reflux disease)   . Hearing loss   . Hearing loss   . History of radiation therapy 09/22/17- 11/06/17   tumor bed left neck with added margin o facial nerve.   . Obstructive sleep apnea   . Primary cancer of parotid gland ([place])   . 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 Vertigo     Past Surgical History:  Procedure Laterality Date  . CESAREAN SECTION     x 3  . COLONOSCOPY    . CYSTOSCOPY    . LAPAROSCOPIC ASSISTED VAGINAL HYSTERECTOMY    . PAROTIDECTOMY Left   . TUBAL LIGATION      There were no vitals filed for this visit.    Subjective Assessment - 09/30/19 1712    Subjective March of this year I slipped on leaves and my knee twisted. I have been battling pain and inflammation. Did 5 visits of PT and went for imaging which was clear. Frequent popping/clicking.    Patient Stated Goals to be able to return to my normal life    Currently in Pain? Yes    Pain Score 8     Pain Location Knee    Pain Orientation Left    Pain Descriptors / Indicators Aching    Pain Type Chronic pain    Aggravating Factors   standing/walking    Pain Relieving Factors voltaren gel              [facility] PT Assessment - 10/01/19 0001      Assessment   Medical Diagnosis Lt knee pain    Referring Provider (PT) Dorris Singh, MD    Onset Date/Surgical Date --   march, 2021     Precautions   Precautions Fall    Precaution Comments vertigo      Restrictions   Weight Bearing Restrictions No      Home Environment   Additional Comments 2 steps into her house       Prior Function   Level of Independence Independent    Vocation Student    Leisure walking       Cognition   Overall Cognitive Status Within Functional Limits for tasks assessed      Observation/Other Assessments   Focus on Therapeutic Outcomes (FOTO)  51% limited      Posture/Postural Control   Posture Comments genu varus bilat      Strength   Overall Strength Comments gross hip & knee 5/5  Palpation   Palpation comment TTP Lt MCL                      Objective measurements completed on examination: See above findings.       [facility] Adult PT Treatment/Exercise - 10/01/19 0001      Exercises   Exercises Knee/Hip      Knee/Hip Exercises: Standing   SLS with knee slightly bent- balance    Other Standing Knee Exercises Lt hip flexion with focus on reducing hip ER                  PT Education - 09/30/19 1737    Education Details ice massage, ball bw knees/feet flat, shoe wear, stability, anatomy of condition, POC, HEP, exercise form/rationale    Person(s) Educated Patient    Methods Explanation;Demonstration;Tactile cues;Verbal cues    Comprehension Verbalized understanding;Returned demonstration;Verbal cues required;Tactile cues required;Need further instruction            PT Short Term Goals - 07/27/19 1213      PT SHORT TERM GOAL #1   Title Patient will demonstrate 5/5 left hip strength    Time 3    Period Weeks    Status On-going    Target Date 07/15/19      PT SHORT TERM GOAL #2   Title  Patient will demonstrate full passive ROM of the hip    Time 3    Period Weeks    Status On-going    Target Date 07/15/19             PT Long Term Goals - 10/01/19 0717      PT LONG TERM GOAL #1   Title Pt will be able to sleep without limitatoins by knee pain    Baseline pain when leg is twisted    Time 6    Period Weeks    Status New    Target Date 11/13/19      PT LONG TERM GOAL #2   Title pt will navigate stairs in reciprocal pattern    Baseline compensating due to pain at eval    Time 6    Period Weeks    Status New    Target Date 11/13/19      PT LONG TERM GOAL #3   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 S/s consistent with MCL sprain resulting in knee instability. She has done very well continuing her HEP from her last episode of therapy and has maintained good strength. Pt will benefit from skilled PT in order to decrease knee pain and reach functional goals.    Comorbidities vertigo, frequent falls; left knee pain from a fall; h/o cancer;    Examination-Activity Limitations Bathing;Bed Mobility;Sit;Bend;Sleep;Carry;Squat;Stairs;Stand;Lift;Transfers;Locomotion Level    Examination-Participation Restrictions Occupation;School;Shop;Laundry    Stability/Clinical Decision Making Stable/Uncomplicated    Clinical Decision Making Low    Rehab Potential Good    PT Frequency 2x / week    PT Duration  4 weeks   will begin in 2 weeks due to being away   PT Treatment/Interventions ADLs/Self Care Home Management;Cryotherapy;Electrical Stimulation;Gait training;Ultrasound;Moist Heat;Iontophoresis 4mg /ml  Dexamethasone;Stair training;Functional mobility training;Therapeutic activities;Therapeutic exercise;Balance training;Neuromuscular re-education;Manual techniques;Patient/family education;Dry needling;Taping    PT Home Exercise Plan cont HEP from last episode, ice massage daily, avoid crossing legs    Consulted and Agree with Plan of Care Patient           Patient will benefit from skilled therapeutic intervention in order to improve the following deficits and impairments:  Difficulty walking, Decreased activity tolerance, Pain, Improper body mechanics, Increased edema  Visit Diagnosis: Chronic pain of left knee - Plan: PT plan of care cert/re-cert  Difficulty in walking, not elsewhere classified - Plan: PT plan of care cert/re-cert     Problem List Patient Active Problem List   Diagnosis Date Noted  . Healthcare maintenance 09/20/2019  . Vestibular disorder 08/16/2019  . Gastroesophageal reflux disease 04/23/2019  . Obesity (BMI 35.0-39.9 without comorbidity) 04/23/2019  . 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 Depression 04/21/2017  . Cigarette smoker 06/21/2015    Verma Grothaus C. Atwood Adcock PT, DPT 10/01/19 7:32 AM   [facility] [address] [place], [place], [address] Phone: [phone]   Fax:  [phone]  Name: Shelley Mason MRN: [id number] Date of Birth: [birth year]

## 2019-10-15 ENCOUNTER — Encounter: Payer: Self-pay | Admitting: Family Medicine

## 2019-10-15 ENCOUNTER — Other Ambulatory Visit: Payer: Self-pay

## 2019-10-15 ENCOUNTER — Ambulatory Visit (INDEPENDENT_AMBULATORY_CARE_PROVIDER_SITE_OTHER): Payer: 59 | Admitting: Family Medicine

## 2019-10-15 VITALS — BP 118/78 | HR 78 | Resp 14 | Ht 65.0 in | Wt 202.0 lb

## 2019-10-15 DIAGNOSIS — F32A Depression, unspecified: Secondary | ICD-10-CM

## 2019-10-15 DIAGNOSIS — H8192 Unspecified disorder of vestibular function, left ear: Secondary | ICD-10-CM

## 2019-10-15 DIAGNOSIS — F17210 Nicotine dependence, cigarettes, uncomplicated: Secondary | ICD-10-CM

## 2019-10-15 DIAGNOSIS — Z85818 Personal history of malignant neoplasm of other sites of lip, oral cavity, and pharynx: Secondary | ICD-10-CM

## 2019-10-15 DIAGNOSIS — F329 Major depressive disorder, single episode, unspecified: Secondary | ICD-10-CM | POA: Diagnosis not present

## 2019-10-15 DIAGNOSIS — F331 Major depressive disorder, recurrent, moderate: Secondary | ICD-10-CM | POA: Diagnosis not present

## 2019-10-15 MED ORDER — FLUOXETINE HCL 20 MG PO CAPS: 20.0000 mg | ORAL_CAPSULE | Freq: Every day | ORAL | 3 refills | Status: DC

## 2019-10-15 NOTE — Assessment & Plan Note (Signed)
Doing well, still with difficulty with certain foods. Will message referrals coordinator regarding ENT who accept insurance given history of head and neck surgery.

## 2019-10-15 NOTE — Assessment & Plan Note (Signed)
Discussed, increased cigarettes with stressor of pandemic. Pre contemplative today---- Wellbutrin reduces cravings but she turns to cigarettes when worried. Discussed other options for coping---writing, calling sometime, sugar free gum etc.

## 2019-10-15 NOTE — Patient Instructions (Addendum)
It was wonderful to see you today.  Please bring ALL of your medications with you to every visit.   Today we talked about:  ---Asking your teacher if you can do virtual   -- Increasing Prozac to 20 mg---I sent a new prescription  -- I will check with our referrals coordinator Katherina Mires) about an ear doctor   FOLLOW UP IN 1 MONTH  I will talk to our social worker about therapy options    Thank you for choosing [facility].   Please call [phone] with any questions about today's appointment.  Please be sure to schedule follow up at the front  desk before you leave today.   Dorris Singh, MD  Family Medicine

## 2019-10-15 NOTE — Progress Notes (Signed)
    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  The patient reports her anxiety is related.  She reports she is very worried about Covid.  She just got back to volunteering and doing her usual activities a few days a week now that has been canceled.  She reports a low mood and poor sleep.  She has increased the number of cigarettes she is smoking  each day from 10 to 15. Denies SI/HI. Finds benefit from Prozac.  She still desires to find a therapist but has been unable to establish care with one.  She has left multiple messages and searched [email or web address] [email or web address]   The patient reports otherwise doing okay. She starts classes back to gain her associates this fall. Has 8 hours left--one math and on early childhood class. Looking forward to this. Has support network but 'everyone else is stressed too--they have it worse than me!'.   PERTINENT  PMH / PSH/Family/Social History :  Myoepithelial carcinoma of parotid (L) Hearing loss (L) Tobacco abuse Mood disorder  Obesity   OBJECTIVE:   BP 118/78   Pulse 78   Resp 14   Wt 202 lb (91.6 kg)   SpO2 96%   BMI 33.10 kg/m   HEENT: Asymmetry to face with absent left parotid.  There is a defect of the auricle of the left ear.  TMs clear there is no erythema or cerumen. Neck: No adenopathy Cardiac: Regular rate and rhythm. Normal S1/S2. No murmurs, rubs, or gallops appreciated. Lungs: Clear bilaterally to ascultation.  Extremities: Warm, well perfused without edema.  Skin: Warm, dry Psych: Appropriate at times appears anxious.   ASSESSMENT/PLAN:   History of malignant neoplasm of parotid gland Doing well, still with difficulty  with certain foods. Will message referrals coordinator regarding ENT who accept insurance given history of head and neck surgery.    Cigarette smoker Discussed, increased cigarettes with stressor of pandemic. Pre contemplative today---- Wellbutrin reduces cravings but she turns to cigarettes when worried. Discussed other options for coping---writing, calling sometime, sugar free gum etc.     Depression Not controlled, increasing medication. Patient has had significant difficulty finding a therapist/counselor. Previously had a relationship with chaplain counselor.  Discussed CCM referral to help find regularly treating therapist. Previously provided multiple numbers. Amenable to referral    HCM  Already received COVID vaccine    Dorris Singh, MD  [place]

## 2019-10-15 NOTE — Assessment & Plan Note (Signed)
Not controlled, increasing medication. Patient has had significant difficulty finding a therapist/counselor. Previously had a relationship with chaplain counselor.  Discussed CCM referral to help find regularly treating therapist. Previously provided multiple numbers. Amenable to referral

## 2019-10-20 ENCOUNTER — Encounter: Payer: Self-pay | Admitting: Physical Therapy

## 2019-10-20 ENCOUNTER — Ambulatory Visit: Payer: 59 | Attending: Family Medicine | Admitting: Physical Therapy

## 2019-10-20 ENCOUNTER — Other Ambulatory Visit: Payer: Self-pay

## 2019-10-20 DIAGNOSIS — G8929 Other chronic pain: Secondary | ICD-10-CM | POA: Insufficient documentation

## 2019-10-20 DIAGNOSIS — R262 Difficulty in walking, not elsewhere classified: Secondary | ICD-10-CM | POA: Insufficient documentation

## 2019-10-20 DIAGNOSIS — M25562 Pain in left knee: Secondary | ICD-10-CM | POA: Diagnosis not present

## 2019-10-20 NOTE — Therapy (Signed)
[place], [place], [address] Phone: [phone]   Fax:  [phone]  Physical Therapy Treatment  Patient Details  Name: Shelley Mason MRN: [id number] Date of Birth: 1965/08/11 Referring Provider (PT): Dorris Singh, MD   Encounter Date: 10/20/2019   PT End of Session - 10/20/19 1843    Visit Number 2    Number of Visits 9    Date for PT Re-Evaluation 11/13/19    Authorization Type [employer]    PT Start Time 1840    PT Stop Time 1922    PT Time Calculation (min) 42 min    Activity Tolerance Patient tolerated treatment well    Behavior During Therapy [facility] for tasks assessed/performed           Past Medical History:  Diagnosis Date  . Anemia    history of  . Anxiety   . Cigarette smoker   . Depression   . Fibroids    history of uterine fibroids  . GERD (gastroesophageal reflux disease)   . Hearing loss   . Hearing loss   . History of radiation therapy 09/22/17- 11/06/17   tumor bed left neck with added margin o facial nerve.   . Obstructive sleep apnea   . Primary cancer of parotid gland ([place])   . 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 Vertigo     Past Surgical History:  Procedure Laterality Date  . CESAREAN SECTION     x 3  . COLONOSCOPY    . CYSTOSCOPY    . LAPAROSCOPIC ASSISTED VAGINAL HYSTERECTOMY    . PAROTIDECTOMY Left   . TUBAL LIGATION      There were no vitals filed for this visit.   Subjective Assessment - 10/20/19 1843    Subjective I have been using my cream, jsut a little bit of pain.    Patient Stated Goals to be able to return to my normal life    Currently in Pain? Yes    Pain Score 5     Pain Location Knee    Pain Orientation Left    Pain Descriptors / Indicators --   stiffness                            [facility] Adult PT Treatment/Exercise - 10/20/19 0001      Lumbar Exercises: Aerobic   Nustep 5 min L6 LE only      Knee/Hip Exercises:  Stretches   Press photographer Both;2 reps;30 seconds      Knee/Hip Exercises: Standing   Heel Raises Both;20 reps;10 reps    Forward Step Up Both;10 reps;2 sets;Step Height: 4";Hand Hold: 2    Forward Step Up Limitations forward & backward    SLS with slight knee bend on green therapad 3x30s each    SLS with Vectors hip hike      Knee/Hip Exercises: Supine   Bridges Limitations legs together    Straight Leg Raise with External Rotation Both;10 reps;3 sets      Knee/Hip Exercises: Sidelying   Hip ABduction Both;20 reps                  PT Education - 10/20/19 1923    Education Details self rolling with rolling pin    Person(s) Educated Patient    Methods Explanation;Demonstration;Verbal cues    Comprehension Verbalized understanding;Returned demonstration  PT Short Term Goals - 07/27/19 1213      PT SHORT TERM GOAL #1   Title Patient will demonstrate 5/5 left hip strength    Time 3    Period Weeks    Status On-going    Target Date 07/15/19      PT SHORT TERM GOAL #2   Title Patient will demonstrate full passive ROM of the hip    Time 3    Period Weeks    Status On-going    Target Date 07/15/19             PT Long Term Goals - 10/01/19 0717      PT LONG TERM GOAL #1   Title Pt will be able to sleep without limitatoins by knee pain    Baseline pain when leg is twisted    Time 6    Period Weeks    Status New    Target Date 11/13/19      PT LONG TERM GOAL #2   Title pt will navigate stairs in reciprocal pattern    Baseline compensating due to pain at eval    Time 6    Period Weeks    Status New    Target Date 11/13/19      PT LONG TERM GOAL #3   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 Pt reported fatigue without increase in knee pain.    PT Treatment/Interventions ADLs/Self Care Home Management;Cryotherapy;Electrical Stimulation;Gait training;Ultrasound;Moist Heat;Iontophoresis 4mg /ml Dexamethasone;Stair training;Functional mobility training;Therapeutic activities;Therapeutic exercise;Balance training;Neuromuscular re-education;Manual techniques;Patient/family education;Dry needling;Taping    PT Next Visit Plan cont gross stability in LEs    PT Home Exercise Plan cont HEP from last episode, ice massage daily, avoid crossing legs  [id number]    Consulted and Agree with Plan of Care Patient           Patient will benefit from skilled therapeutic intervention in order to improve the following deficits and impairments:  Difficulty walking, Decreased activity tolerance, Pain, Improper body mechanics, Increased edema  Visit Diagnosis: Chronic pain of left knee  Difficulty in walking, not elsewhere classified     Problem List Patient Active Problem List   Diagnosis Date Noted  . Healthcare maintenance 09/20/2019  . Vestibular disorder 08/16/2019  . Gastroesophageal reflux disease 04/23/2019  . Obesity (BMI 35.0-39.9 without comorbidity) 04/23/2019  . History of malignant neoplasm of parotid gland 09/17/2017  . Depression 04/21/2017  . Cigarette smoker 06/21/2015    [name] PT, DPT 10/20/19 7:23 PM   [facility] [address] [place], [place], [address] Phone: [phone]   Fax:  [phone]  Name: Shelley Mason MRN: [id number] Date of Birth: [birth year]

## 2019-10-23 ENCOUNTER — Ambulatory Visit: Payer: 59 | Admitting: Physical Therapy

## 2019-10-23 ENCOUNTER — Encounter: Payer: Self-pay | Admitting: Physical Therapy

## 2019-10-23 ENCOUNTER — Other Ambulatory Visit: Payer: Self-pay

## 2019-10-23 DIAGNOSIS — R262 Difficulty in walking, not elsewhere classified: Secondary | ICD-10-CM

## 2019-10-23 DIAGNOSIS — G8929 Other chronic pain: Secondary | ICD-10-CM

## 2019-10-23 DIAGNOSIS — M25562 Pain in left knee: Secondary | ICD-10-CM | POA: Diagnosis not present

## 2019-10-23 NOTE — Therapy (Signed)
[place], [place], [address] Phone: [phone]   Fax:  [phone]  Physical Therapy Treatment  Patient Details  Name: Shelley Mason MRN: [id number] Date of Birth: August 29, 1965 Referring Provider (PT): Dorris Singh, MD   Encounter Date: 10/23/2019   PT End of Session - 10/23/19 0904    Visit Number 3    Number of Visits 9    Date for PT Re-Evaluation 11/13/19    Authorization Type [employer]    PT Start Time 0903    PT Stop Time 0953    PT Time Calculation (min) 50 min    Activity Tolerance Patient tolerated treatment well    Behavior During Therapy [facility] for tasks assessed/performed           Past Medical History:  Diagnosis Date  . Anemia    history of  . Anxiety   . Cigarette smoker   . Depression   . Fibroids    history of uterine fibroids  . GERD (gastroesophageal reflux disease)   . Hearing loss   . Hearing loss   . History of radiation therapy 09/22/17- 11/06/17   tumor bed left neck with added margin o facial nerve.   . Obstructive sleep apnea   . Primary cancer of parotid gland ([place])   . 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 Vertigo     Past Surgical History:  Procedure Laterality Date  . CESAREAN SECTION     x 3  . COLONOSCOPY    . CYSTOSCOPY    . LAPAROSCOPIC ASSISTED VAGINAL HYSTERECTOMY    . PAROTIDECTOMY Left   . TUBAL LIGATION      There were no vitals filed for this visit.   Subjective Assessment - 10/23/19 0905    Subjective Limping a little- it;s just early    Patient Stated Goals to be able to return to my normal life    Currently in Pain? Yes    Pain Score 10-Worst pain ever    Pain Location Knee    Pain Orientation Left    Pain Descriptors / Indicators --   stiff             [facility] PT Assessment - 10/23/19 0001      Palpation   Palpation comment TTP is getting better                         [facility] Adult PT Treatment/Exercise -  10/23/19 0001      Knee/Hip Exercises: Stretches   Passive Hamstring Stretch Both;30 seconds    Passive Hamstring Stretch Limitations supine with strap    ITB Stretch Limitations supine with strap    Gastroc Stretch Both;2 reps;30 seconds      Knee/Hip Exercises: Aerobic   Stationary Bike 5 min L2      Knee/Hip Exercises: Standing   Heel Raises 20 reps;10 reps    Heel Raises Limitations yellow tband at ankles    Hip Abduction 15 reps    Abduction Limitations yellow tband at ankles, slight bend fwd    Other Standing Knee Exercises side stepping yellow tband at ankles      Knee/Hip Exercises: Supine   Bridges Strengthening;10 reps    Bridges Limitations 3s holds, hip width apart    Straight Leg Raises Both;3 sets;10 reps    Straight Leg Raises Limitations without touching table      Manual Therapy   Manual Therapy  Taping    Soft tissue mobilization roller Lt quads    Kinesiotex Ligament Correction      Kinesiotix   Ligament Correction Lt MCL          ice pack Lt knee 10 min end of session          PT Short Term Goals - 07/27/19 1213      PT SHORT TERM GOAL #1   Title Patient will demonstrate 5/5 left hip strength    Time 3    Period Weeks    Status On-going    Target Date 07/15/19      PT SHORT TERM GOAL #2   Title Patient will demonstrate full passive ROM of the hip    Time 3    Period Weeks    Status On-going    Target Date 07/15/19             PT Long Term Goals - 10/01/19 0717      PT LONG TERM GOAL #1   Title Pt will be able to sleep without limitatoins by knee pain    Baseline pain when leg is twisted    Time 6    Period Weeks    Status New    Target Date 11/13/19      PT LONG TERM GOAL #2   Title pt will navigate stairs in reciprocal pattern    Baseline compensating due to pain at eval    Time 6    Period Weeks    Status New    Target Date 11/13/19      PT LONG TERM GOAL #3   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 tolerated exercises well and placed X support to MCL with K-tape. Advised her to use sleeve brace support and ice extra as she will be sitting outside for football game today.    PT Treatment/Interventions ADLs/Self Care Home Management;Cryotherapy;Electrical Stimulation;Gait training;Ultrasound;Moist Heat;Iontophoresis 4mg /ml Dexamethasone;Stair training;Functional mobility training;Therapeutic activities;Therapeutic exercise;Balance training;Neuromuscular re-education;Manual techniques;Patient/family education;Dry needling;Taping    PT Next Visit Plan cont CKC as tolerated & progress HEP    PT Home Exercise Plan cont HEP from last episode, ice massage daily, avoid crossing legs  [id number]    Consulted and Agree with Plan of Care Patient           Patient will benefit from skilled therapeutic intervention in order to improve the following deficits and impairments:  Difficulty walking, Decreased activity tolerance, Pain, Improper body mechanics, Increased edema  Visit Diagnosis: Chronic pain of left knee  Difficulty in walking, not elsewhere classified     Problem List Patient Active Problem List   Diagnosis Date Noted  . Healthcare maintenance 09/20/2019  . Vestibular disorder 08/16/2019  . Gastroesophageal reflux disease 04/23/2019  . Obesity (BMI 35.0-39.9 without comorbidity) 04/23/2019  . History of malignant neoplasm of parotid gland 09/17/2017  . Depression 04/21/2017  . Cigarette smoker 06/21/2015    Lailynn Southgate C. Zell Hylton PT, DPT 10/23/19 9:46 AM   [facility] [address] [place], [place], [address]  Phone: [phone]   Fax:   [phone]  Name: Shelley Mason MRN: [id number] Date of Birth: [birth year]

## 2019-10-25 ENCOUNTER — Other Ambulatory Visit: Payer: Self-pay | Admitting: *Deleted

## 2019-10-25 DIAGNOSIS — F331 Major depressive disorder, recurrent, moderate: Secondary | ICD-10-CM

## 2019-10-25 MED ORDER — FLUOXETINE HCL 20 MG PO CAPS: 20.0000 mg | ORAL_CAPSULE | Freq: Every day | ORAL | 3 refills | Status: DC

## 2019-10-27 ENCOUNTER — Other Ambulatory Visit: Payer: Self-pay

## 2019-10-27 ENCOUNTER — Encounter: Payer: Self-pay | Admitting: Physical Therapy

## 2019-10-27 ENCOUNTER — Ambulatory Visit: Payer: 59 | Admitting: Physical Therapy

## 2019-10-27 DIAGNOSIS — R262 Difficulty in walking, not elsewhere classified: Secondary | ICD-10-CM

## 2019-10-27 DIAGNOSIS — M25562 Pain in left knee: Secondary | ICD-10-CM

## 2019-10-27 DIAGNOSIS — G8929 Other chronic pain: Secondary | ICD-10-CM

## 2019-10-27 NOTE — Therapy (Addendum)
[place], [place], [address] Phone: [phone]   Fax:  [phone]  Physical Therapy Treatment/Discharge  Patient Details  Name: Shelley Mason MRN: [id number] Date of Birth: [birth year] Referring Provider (PT): Dorris Singh, MD   Encounter Date: 10/27/2019   PT End of Session - 10/27/19 1845    Visit Number 4    Number of Visits 9    Date for PT Re-Evaluation 11/13/19    Authorization Type [employer]    PT Start Time 2426   pulled pt back prior to check in   PT Stop Time 1905    PT Time Calculation (min) 25 min    Activity Tolerance Patient tolerated treatment well    Behavior During Therapy [facility] for tasks assessed/performed           Past Medical History:  Diagnosis Date  . Anemia    history of  . Anxiety   . Cigarette smoker   . Depression   . Fibroids    history of uterine fibroids  . GERD (gastroesophageal reflux disease)   . Hearing loss   . Hearing loss   . History of radiation therapy 09/22/17- 11/06/17   tumor bed left neck with added margin o facial nerve.   . Obstructive sleep apnea   . Primary cancer of parotid gland ([place])   . 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 Vertigo     Past Surgical History:  Procedure Laterality Date  . CESAREAN SECTION     x 3  . COLONOSCOPY    . CYSTOSCOPY    . LAPAROSCOPIC ASSISTED VAGINAL HYSTERECTOMY    . PAROTIDECTOMY Left   . TUBAL LIGATION      There were no vitals filed for this visit.   Subjective Assessment - 10/27/19 1844    Subjective I was up a lot today, knee is feeling pretty good. I have been icing it down to decrease swelling. I am tired todya, stressing a little bit.    Currently in Pain? No/denies                             [facility] Adult PT Treatment/Exercise - 10/27/19 0001      Lumbar Exercises: Standing   Other Standing Lumbar Exercises stairs with education on pattern    Other Standing Lumbar  Exercises single leg mini squats on blue therapad      Knee/Hip Exercises: Standing   Heel Raises Limitations heel raise with mini knee bend at top    SLS slight unlock- UE ABCs                   PT Education - 10/27/19 1909    Education Details goals & progress    Person(s) Educated Patient    Methods Explanation    Comprehension Verbalized understanding;Need further instruction            PT Short Term Goals - 07/27/19 1213      PT SHORT TERM GOAL #1   Title Patient will demonstrate 5/5 left hip strength    Time 3    Period Weeks    Status On-going    Target Date 07/15/19      PT SHORT TERM GOAL #2   Title Patient will demonstrate full passive ROM of the hip    Time 3    Period Weeks    Status On-going  Target Date 07/15/19             PT Long Term Goals - 10/27/19 1848      PT LONG TERM GOAL #1   Title Pt will be able to sleep without limitatoins by knee pain    Baseline not due to kneee pain    Status Achieved      PT LONG TERM GOAL #2   Title pt will navigate stairs in reciprocal pattern    Baseline I can go up but coming down is a problem    Status Partially Met      PT LONG TERM GOAL #3   Title pt will be able to stand for teaching without limitation by knee pain    Baseline I can stand about 30 min and then need a rest, gets to moderate pain, burning sensation    Status On-going                 Plan - 10/27/19 1907    Clinical Impression Statement Pt is making excellent progress toward her goals. She feels the biggest problem left is confidence in her Left leg catching her when leading with Left while descending stairs. worked on [employer] with knee unlocked for control in near- terminal knee extension. Pt felt it appropraite to end PT a little early today as her stress levels were very high after today.    PT Treatment/Interventions ADLs/Self Care Home Management;Cryotherapy;Electrical Stimulation;Gait training;Ultrasound;Moist  Heat;Iontophoresis 19m/ml Dexamethasone;Stair training;Functional mobility training;Therapeutic activities;Therapeutic exercise;Balance training;Neuromuscular re-education;Manual techniques;Patient/family education;Dry needling;Taping    PT Next Visit Plan cont CKC as tolerated & progress HEP    PT Home Exercise Plan cont HEP from last episode, ice massage daily, avoid crossing legs  [id number]    Consulted and Agree with Plan of Care Patient           Patient will benefit from skilled therapeutic intervention in order to improve the following deficits and impairments:  Difficulty walking, Decreased activity tolerance, Pain, Improper body mechanics, Increased edema  Visit Diagnosis: Chronic pain of left knee  Difficulty in walking, not elsewhere classified     Problem List Patient Active Problem List   Diagnosis Date Noted  . Healthcare maintenance 09/20/2019  . Vestibular disorder 08/16/2019  . Gastroesophageal reflux disease 04/23/2019  . Obesity (BMI 35.0-39.9 without comorbidity) 04/23/2019  . History of malignant neoplasm of parotid gland 09/17/2017  . Depression 04/21/2017  . Cigarette smoker 06/21/2015   Shelley Mason C. Kobie Whidby PT, DPT 10/27/19 7:09 PM   [facility]GTimnath N[place] [address]Phone: [phone]  Fax:  [phone] Name: Shelley POLKOWSKIMRN: [id number] of Birth: [birth year] PHYSICAL THERAPY DISCHARGE SUMMARY  Visits from Start of Care: 4  Current functional level related to goals / functional outcomes: See above   Remaining deficits: See above   Education / Equipment: Anatomy of condition, POC, HEP, exercise form/[place]  Plan: Patient agrees to discharge.  Patient goals were partially met. Patient is being discharged due to the patient's request.  ?????     Joshual Terrio C. Tanikka Bresnan PT, DPT 11/03/19 6:31 PM

## 2019-11-03 ENCOUNTER — Telehealth: Payer: Self-pay | Admitting: Licensed Clinical Social Worker

## 2019-11-03 ENCOUNTER — Telehealth: Payer: 59

## 2019-11-03 ENCOUNTER — Ambulatory Visit: Payer: 59 | Admitting: Physical Therapy

## 2019-11-03 NOTE — Chronic Care Management (AMB) (Signed)
    Clinical Social Work  Care Management Outreach   11/03/2019 Name: AMBYR QADRI MRN: [id number] DOB: 1966/01/23  ADAMARYS SHALL is a 54 y.o. year old female who is a primary care patient of Martyn Malay, MD .  The Care Management team was consulted for assistance with Mental Health Counseling and Resources.   LCSW reached out to [name] today by phone to introduce self, assess needs and offer Care Management services and interventions.  The outreach was unsuccessful. A HIPPA compliant phone message was left for the patient providing contact information and requesting a return call.  Plan: LCSW will wait for return call, if no return call will ask care team to reach out to patient to reschedule.   Review of patient status, including review of consultants reports, relevant laboratory and other test results, and collaboration with appropriate care team members and the patient's provider was performed as part of comprehensive patient evaluation and provision of care management services.    Casimer Lanius, [place] / [place]   [phone] 1:59 PM

## 2019-11-04 ENCOUNTER — Telehealth: Payer: Self-pay | Admitting: *Deleted

## 2019-11-04 NOTE — Chronic Care Management (AMB) (Signed)
  Care Management   Note  11/04/2019 Name: Shelley Mason MRN: [id number] DOB: 03-Jan-1966  Shelley Mason is a 54 y.o. year old female who is a primary care patient of Martyn Malay, MD. I reached out to [place] by phone today in response to a referral sent by Shelley Mason's health plan.    Ms. Hark was given information about care management services today including:  1. Care management services include personalized support from designated clinical staff supervised by her physician, including individualized plan of care and coordination with other care providers 2. 24/7 contact phone numbers for assistance for urgent and routine care needs. 3. The patient may stop care management services at any time by phone call to the office staff.  Patient agreed to services and verbal consent obtained.   Follow up plan: Telephone appointment with care management team member scheduled for:11/10/2019  Spencer Peterkin  Care Guide, [place] [facility]  [place], [place] [address] Direct Dial: [email or web address]

## 2019-11-10 ENCOUNTER — Ambulatory Visit: Payer: 59 | Admitting: Licensed Clinical Social Worker

## 2019-11-10 ENCOUNTER — Encounter: Payer: 59 | Admitting: Physical Therapy

## 2019-11-10 DIAGNOSIS — F32A Depression, unspecified: Secondary | ICD-10-CM

## 2019-11-10 NOTE — Chronic Care Management (AMB) (Signed)
    Clinical Social Work  Care Management Outreach   11/10/2019 Name: INNA TISDELL MRN: [id number] DOB: [birth year]  DEZIAH RENWICK is a 54 y.o. year old female who is a primary care patient of Martyn Malay, MD .  The Care Management team was consulted for assistance with Mental Health Counseling and Resources.  LCSW reached out to [name] today by phone to introduce self, assess needs and offer Care Management services and interventions. Patient unable to participate in phone assessment, requested to reschedule for next week  Plan: Phone appointment scheduled Tuesday Sept 14th.  Review of patient status, including review of consultants reports, relevant laboratory and other test results, and collaboration with appropriate care team members and the patient's provider was performed as part of comprehensive patient evaluation and provision of care management services.    Casimer Lanius, [place] / [place]   [phone] 2:52 PM

## 2019-11-13 ENCOUNTER — Encounter: Payer: 59 | Admitting: Physical Therapy

## 2019-11-16 ENCOUNTER — Ambulatory Visit: Payer: 59 | Admitting: Licensed Clinical Social Worker

## 2019-11-16 DIAGNOSIS — F32A Depression, unspecified: Secondary | ICD-10-CM

## 2019-11-16 NOTE — Chronic Care Management (AMB) (Signed)
   Clinical Social Work  Care Management referral   11/16/2019 Name: Shelley Mason MRN: [id number] DOB: [birth year]  Shelley Mason is a 54 y.o. year old female who is a primary care patient of Martyn Malay, MD . LCSW was consulted by PCP for assistance with Mental Health Counseling and Resources. LCSW reached out to [name] today by phone to introduce self, assess needs and offer Care Management services and interventions.    Assessment: Patient was busy and not able to talk wanted to know how long phone call would take.  Reports she has make progress with connecting for counseling.  States she has a Social worker and her initial appointment is Nov. 1st .  No other services identified.  Intervention: Patient does not require or desire additional follow up at this time.    Plan:  1. No F/U scheduled with LSCW 2.  Patient will call office if needed  Advance Directive Status:  not addressed during this encounter.  SDOH (Social Determinants of Health) assessments performed: No ;     Outpatient Encounter Medications as of 11/16/2019  Medication Sig  . buPROPion (WELLBUTRIN SR) 150 MG 12 hr tablet Take 1 tablet for 3 days then increase to 1 tablet twice a day  . diclofenac Sodium (VOLTAREN) 1 % GEL Apply four times daily to kene  . FLUoxetine (PROZAC) 20 MG capsule Take 1 capsule (20 mg total) by mouth at bedtime.  . meclizine (ANTIVERT) 25 MG tablet Take 1 tablet (25 mg total) by mouth 3 (three) times daily as needed for dizziness.  . nicotine (NICODERM CQ) 21 mg/24hr patch Place 1 patch (21 mg total) onto the skin daily. (Patient not taking: Reported on 10/15/2019)  . omeprazole (PRILOSEC) 40 MG capsule Take 1 capsule (40 mg total) by mouth daily.  . sodium fluoride (PREVIDENT 5000 PLUS) 1.1 % CREA dental cream Apply cream to tooth brush. Brush teeth for 2 minutes. Spit out excess. DO NOT rinse afterwards. Repeat nightly. (Patient not taking: Reported on 09/30/2019)  . sucralfate  (CARAFATE) 1 g tablet Take 1 tablet (1 g total) by mouth 2 (two) times daily as needed.   No facility-administered encounter medications on file as of 11/16/2019.  Review of patient status, including review of consultants reports, relevant laboratory and other test results, and collaboration with appropriate care team members and the patient's provider was performed as part of comprehensive patient evaluation and provision of care management services.   Casimer Lanius, [place] / [place]   [phone] 2:13 PM

## 2019-11-26 ENCOUNTER — Ambulatory Visit (INDEPENDENT_AMBULATORY_CARE_PROVIDER_SITE_OTHER): Payer: 59 | Admitting: Family Medicine

## 2019-11-26 ENCOUNTER — Encounter: Payer: Self-pay | Admitting: Family Medicine

## 2019-11-26 ENCOUNTER — Other Ambulatory Visit: Payer: Self-pay

## 2019-11-26 VITALS — BP 122/78 | HR 107 | Ht 64.0 in | Wt 210.2 lb

## 2019-11-26 DIAGNOSIS — F17210 Nicotine dependence, cigarettes, uncomplicated: Secondary | ICD-10-CM

## 2019-11-26 DIAGNOSIS — K219 Gastro-esophageal reflux disease without esophagitis: Secondary | ICD-10-CM | POA: Diagnosis not present

## 2019-11-26 DIAGNOSIS — Z85818 Personal history of malignant neoplasm of other sites of lip, oral cavity, and pharynx: Secondary | ICD-10-CM | POA: Diagnosis not present

## 2019-11-26 DIAGNOSIS — E669 Obesity, unspecified: Secondary | ICD-10-CM

## 2019-11-26 DIAGNOSIS — F331 Major depressive disorder, recurrent, moderate: Secondary | ICD-10-CM | POA: Diagnosis not present

## 2019-11-26 MED ORDER — FLUOXETINE HCL 10 MG PO CAPS: 10.0000 mg | ORAL_CAPSULE | Freq: Every day | ORAL | 3 refills | Status: DC

## 2019-11-26 MED ORDER — DICLOFENAC SODIUM 1 % EX GEL: CUTANEOUS | 1 refills | Status: DC

## 2019-11-26 NOTE — Progress Notes (Signed)
    SUBJECTIVE:   CHIEF COMPLAINT / HPI:   Shelley Mason is a pleasant 54 year old with history of left sided parotid tumor s/p resection and radiation with resulting severe vertigo and hearing loss presenting for follow up.  Social Stressors Patient reports her disability insurer is being challenging--will make decision on case in next two weeks. She has tried vestibular rehab, PT, and medications without success. She reports her anxiety is 'high'. She endorses a poor appetite, low mood, poor sleep. Enjoys spending time with grandchildren (63 year old birthday today). Denies SI/HI. Looks forward to events with grandchildren. She is taking Wellbutrin and Prozac 20 mg nightly. She endorses anxiety with the Prozac, only using PRN.   History of Parotid Tumor Discussed referrals, amenable to going to [facility] for care given needs and significant history. Has CT on Monday.   Tobacco Use Reports coping strategies is smoking. She desires to quit, endorses quite a bit of stress in her life and feels it would be overwhelming to quit at this time. She is interested in cutting down.  Obesity Patient has had 8 pound weight gain by our scale. She denies overeating. No polyuria or polydipsia. She desires weight loss.   PERTINENT  PMH / PSH/Family/Social History : Reviewed and updated  OBJECTIVE:   BP 122/78   Pulse (!) 107   Ht 5\' 4"  (1.626 m)   Wt 210 lb 3.2 oz (95.3 kg)   SpO2 100%   BMI 36.08 kg/m    HR 92 on my examination  HEENT: Asymmetry of face with hard submandibular area, defect of left ear auricle, poor dentition  Cardiac: Regular rate and rhythm. Normal S1/S2. No murmurs, rubs, or gallops appreciated. Lungs: Clear bilaterally to ascultation.  Abdomen: Normoactive bowel sounds. No tenderness to deep or light palpation. No rebound or guarding.  Extremities: Warm, well perfused without edema.  Skin: Warm, dry Psych: Pleasant and appropriate   ASSESSMENT/PLAN:   Gastroesophageal  reflux disease Doing well with dietary changes.   History of malignant neoplasm of parotid gland Last seen by Radiation Oncology in January--CT scheduled for Monday. Referral to [facility] ENT---patient's boyfriend will be able to take her   Depression Worsening in setting of acute financial stressor. Encouraged her to attend therapy appointment.  As she is having side effects from Prozac - switch to AM and dose reduce Follow up in 2 weeks on phone given acute stressors   Cigarette smoker Discussed strategies for quitting, encouraged cessation, pre contemplative.   Obesity (BMI 35.0-39.9 without comorbidity) Discussed, she is working on dietary changes, lipid panel today. A1C was 5.4 congratulated.    HCM - Discuss flu shot - Discuss smoking and discuss Prozac again  Consider CBC, TSH at follow up    Dorris Singh, [place]

## 2019-11-26 NOTE — Assessment & Plan Note (Signed)
Doing well with dietary changes.

## 2019-11-26 NOTE — Patient Instructions (Signed)
It was wonderful to see you today.  Please bring ALL of your medications with you to every visit.   Today we talked about:  - Deep breathing exercises when anxious  - Try celery, sugar free lollipops instead of cigarettes  - Prozac- 10 mg in the MORNING  - Follow up   Thank you for choosing [facility].   Please call [phone] with any questions about today's appointment.  Please be sure to schedule follow up at the front  desk before you leave today.   Dorris Singh, MD  Family Medicine

## 2019-11-26 NOTE — Assessment & Plan Note (Signed)
Worsening in setting of acute financial stressor. Encouraged her to attend therapy appointment.  As she is having side effects from Prozac - switch to AM and dose reduce Follow up in 2 weeks on phone given acute stressors

## 2019-11-26 NOTE — Assessment & Plan Note (Signed)
Discussed strategies for quitting, encouraged cessation, pre contemplative.

## 2019-11-26 NOTE — Assessment & Plan Note (Signed)
Discussed, she is working on dietary changes, lipid panel today. A1C was 5.4 congratulated.

## 2019-11-26 NOTE — Assessment & Plan Note (Signed)
Last seen by Radiation Oncology in January--CT scheduled for Monday. Referral to [facility] ENT---patient's boyfriend will be able to take her

## 2019-11-27 LAB — LIPID PANEL
Chol/HDL Ratio: 4.1 ratio (ref 0.0–4.4)
Cholesterol, Total: 213 mg/dL — ABNORMAL HIGH (ref 100–199)
HDL: 52 mg/dL (ref >39)
LDL Chol Calc (NIH): 141 mg/dL — ABNORMAL HIGH (ref 0–99)
Triglycerides: 114 mg/dL (ref 0–149)
VLDL Cholesterol Cal: 20 mg/dL (ref 5–40)

## 2019-11-29 ENCOUNTER — Other Ambulatory Visit: Payer: Self-pay

## 2019-11-29 ENCOUNTER — Ambulatory Visit (HOSPITAL_COMMUNITY)
Admission: RE | Admit: 2019-11-29 | Discharge: 2019-11-29 | Disposition: A | Discharge status: Home / Self Care | Payer: 59 | Source: Ambulatory Visit | Attending: Radiation Oncology | Admitting: Radiation Oncology

## 2019-11-29 DIAGNOSIS — C07 Malignant neoplasm of parotid gland: Secondary | ICD-10-CM | POA: Insufficient documentation

## 2019-11-29 MED ORDER — IOHEXOL 300 MG/ML  SOLN
75.0000 mL | Freq: Once | INTRAMUSCULAR | Status: AC | PRN
  Administered 2019-11-29: 75 mL via INTRAVENOUS

## 2019-11-30 ENCOUNTER — Other Ambulatory Visit: Payer: Self-pay

## 2019-11-30 ENCOUNTER — Ambulatory Visit
Admission: RE | Admit: 2019-11-30 | Discharge: 2019-11-30 | Disposition: A | Discharge status: Home / Self Care | Payer: 59 | Source: Ambulatory Visit | Attending: Radiation Oncology | Admitting: Radiation Oncology

## 2019-11-30 VITALS — BP 118/79 | HR 105 | Temp 98.2°F | Resp 18 | Ht 64.0 in | Wt 208.0 lb

## 2019-11-30 DIAGNOSIS — R42 Dizziness and giddiness: Secondary | ICD-10-CM | POA: Insufficient documentation

## 2019-11-30 DIAGNOSIS — M7989 Other specified soft tissue disorders: Secondary | ICD-10-CM | POA: Insufficient documentation

## 2019-11-30 DIAGNOSIS — K0889 Other specified disorders of teeth and supporting structures: Secondary | ICD-10-CM | POA: Diagnosis not present

## 2019-11-30 DIAGNOSIS — Z85818 Personal history of malignant neoplasm of other sites of lip, oral cavity, and pharynx: Secondary | ICD-10-CM | POA: Diagnosis not present

## 2019-11-30 DIAGNOSIS — R269 Unspecified abnormalities of gait and mobility: Secondary | ICD-10-CM | POA: Insufficient documentation

## 2019-11-30 DIAGNOSIS — F17210 Nicotine dependence, cigarettes, uncomplicated: Secondary | ICD-10-CM | POA: Diagnosis not present

## 2019-11-30 DIAGNOSIS — Z79899 Other long term (current) drug therapy: Secondary | ICD-10-CM | POA: Diagnosis not present

## 2019-11-30 DIAGNOSIS — C07 Malignant neoplasm of parotid gland: Secondary | ICD-10-CM

## 2019-11-30 NOTE — Progress Notes (Signed)
Ms. Klaas presents for follow up of radiation completed 11/06/2017 to her left parotid bed, and to review results from CT scan on 11/30/2019  Pain issues, if any: Patient denies. Though she is still dealing with knee issues after a fall ~6 months ago. She states she completed her course of PT, but still walks with a limp occasionally and the knee swells periodically. She was told she would need surgery to repair the issue, but has not been able to pursue, so she is managing as best she can currently. Using a feeding tube?: N/A Weight changes, if any:  Wt Readings from Last 3 Encounters:  11/30/19 208 lb (94.3 kg)  11/26/19 210 lb 3.2 oz (95.3 kg)  10/15/19 202 lb (91.6 kg)   Swallowing issues, if any: Yes. She reports she must "wake up her mouth" before eating anything, as well as cut up her foods into very small bites, and be mindful of how she's chewing/swallowing.  Smoking or chewing tobacco? Still smoking cigarettes daily Using fluoride trays daily? She is brushing her teeth with fluoride toothpaste Last ENT visit was on: (Per patient PCP is helping to set patient up with new ENT in [facility] given insurance constraints) 11/19/2018 Saw Lynden Oxford: "Impression & Plans:  Nadya Hopwood is a 54 y.o. female with history of salivary cancer. Doing well. She had some recent left ear discomfort; this is most likely from an adhered crust on the left TM which was removed under microscopic guidance. Ears are otherwise normal to inspection. Recommend she monitor for persistent or worsening symptoms. I would like her to have the hearing re-checked as she is likely a hearing aid candidate for left ear. We will also schedule a Dix-Hallpike maneuver for possible BPPV. Patient agrees with the plan."  Other notable issues, if any: Patient denies any swelling/lymphedema. Reports she's not sleeping well to due to stress and current medications. Reports she's still having issues with vertigo and  inner ear complications. She is also still in the process of trying to find a denitist to help with dental caries. She also opened up about personal stress/struggles and difficulty in getting connected with mental health provider. She finally found a specialist that she believes will work for her, and is scheduled for a session on 01/03/2020

## 2019-12-02 ENCOUNTER — Telehealth: Payer: Self-pay | Admitting: *Deleted

## 2019-12-02 NOTE — Progress Notes (Signed)
Radiation Oncology         [phone] ________________________________  Name: Shelley Mason MRN: [id number]  Date: 11/30/2019  DOB: [birth year]  Follow-Up outpatient note  CC: Shelley Malay, MD  Shelley Gala, MD  Diagnosis and Prior Radiotherapy:       ICD-10-CM   1. History of malignant neoplasm of parotid gland  Z85.818   2. 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  Cancer Staging Malignant neoplasm of parotid gland [facility]) Staging form: Major Salivary Glands, AJCC 8th Edition - Clinical: No stage assigned - Unsigned - Pathologic: Stage II (pT2, pN0, cM0) - Signed by Shelley Gibson, MD on 09/17/2017   Radiation treatment dates:   09/22/2017 to 11/06/2017 Site/dose:   The Left parotid bed and distal facial nerve pathway were treated to 64 Gy in 32 fractions of 2 Gy.  CHIEF COMPLAINT:  Here for follow-up and surveillance of left parotid cancer  Narrative:    [name] presents for follow up of radiation completed 11/06/2017 to her left parotid bed, and to review results from CT scan on 11/30/2019  Pain issues, if any: Patient denies. Though she is still dealing with knee issues after a fall ~6 months ago. She states she completed her course of PT, but still walks with a limp occasionally and the knee swells periodically. She was told she would need surgery to repair the issue, but has not been able to pursue, so she is managing as best she can currently.  She reported continued debilitating vertigo that has not improved.  Using a feeding tube?: N/A Weight changes, if any:  Wt Readings from Last 3 Encounters:  11/30/19 208 lb (94.3 kg)  11/26/19 210 lb 3.2 oz (95.3 kg)  10/15/19 202 lb (91.6 kg)   Swallowing issues, if any: Yes. She reports she must "wake up her mouth" before eating anything, as well as cut up her  foods into very small bites, and be mindful of how she's chewing/swallowing.  Smoking or chewing tobacco? Still smoking cigarettes daily Using fluoride trays daily? She is brushing her teeth with fluoride toothpaste Last ENT visit was on: (Per patient PCP is helping to set patient up with new ENT in [facility] given insurance constraints) 11/19/2018 Saw Shelley Mason: "Impression & Plans:  Shelley Mason is a 53 y.o. female with history of salivary cancer. Doing well. She had some recent left ear discomfort; this is most likely from an adhered crust on the left TM which was removed under microscopic guidance. Ears are otherwise normal to inspection. Recommend she monitor for persistent or worsening symptoms. I would like her to have the hearing re-checked as she is likely a hearing aid candidate for left ear. We will also schedule a Dix-Hallpike maneuver for possible BPPV. Patient agrees with the plan."  Other notable issues, if any: Patient denies any swelling/lymphedema. Reports she's not sleeping well to due to stress and current medications. Reports she's still having issues with vertigo and inner ear complications. She is also still in the process of trying to find a denitist to help with dental caries. She also opened up about personal stress/struggles and difficulty in getting connected with mental health provider. She finally found a specialist that she believes will work for her, and is scheduled for a session on 01/03/2020   Dental pain is significant, especially at right mandibular molar.  Cannot  get dental care due to insurance issues. Dry mouth since parotidectomy, radiotherapy.  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    ALLERGIES:  is allergic to penicillins and codeine.  Meds: Current Outpatient Medications  Medication Sig Dispense Refill  . buPROPion (WELLBUTRIN SR) 150 MG 12 hr tablet Take 1 tablet for 3 days then increase to  1 tablet twice a day 180 tablet 3  . diclofenac Sodium (VOLTAREN) 1 % GEL Apply four times daily to kene 150 g 1  . FLUoxetine (PROZAC) 10 MG capsule Take 1 capsule (10 mg total) by mouth daily. 90 capsule 3  . meclizine (ANTIVERT) 25 MG tablet Take 1 tablet (25 mg total) by mouth 3 (three) times daily as needed for dizziness. 30 tablet 0  . nicotine (NICODERM CQ) 21 mg/24hr patch Place 1 patch (21 mg total) onto the skin daily. 14 patch 0  . omeprazole (PRILOSEC) 40 MG capsule Take 1 capsule (40 mg total) by mouth daily. 30 capsule 3  . sodium fluoride (PREVIDENT 5000 PLUS) 1.1 % CREA dental cream Apply cream to tooth brush. Brush teeth for 2 minutes. Spit out excess. DO NOT rinse afterwards. Repeat nightly. 51 g 3  . sucralfate (CARAFATE) 1 g tablet Take 1 tablet (1 g total) by mouth 2 (two) times daily as needed. 60 tablet 3   No current facility-administered medications for this encounter.    Physical Findings: Wt Readings from Last 3 Encounters:  11/30/19 208 lb (94.3 kg)  11/26/19 210 lb 3.2 oz (95.3 kg)  10/15/19 202 lb (91.6 kg)    height is 5\' 4"  (1.626 m) and weight is 208 lb (94.3 kg). Her oral temperature is 98.2 F (36.8 C). Her blood pressure is 118/79 and her pulse is 105 (abnormal). Her respiration is 18 and oxygen saturation is 100%.    GEN: alert, oriented, NAD  HEENT: dentition in poor condition. Majority of one of her right mandibular molars has broken off. No thrush or tumor in mouth.  Crusting, dryness in external auditory canal (left); b/l tympanic membranes are grossly WNL Neck: no adenopathy in periauricular , cervical, or SCV regions  Lab Findings: Lab Results  Component Value Date   WBC 10.9 (H) 07/27/2019   HGB 12.8 07/27/2019   HCT 39.9 07/27/2019   MCV 94.3 07/27/2019   PLT 270 07/27/2019   CMP     Component Value Date/Time   NA 141 07/27/2019 1925   NA 144 04/23/2019 1427   K 3.5 07/27/2019 1925   CL 106 07/27/2019 1925   CO2 28 07/27/2019 1925    GLUCOSE 96 07/27/2019 1925   BUN 12 07/27/2019 1925   BUN 9 04/23/2019 1427   CREATININE 0.95 07/27/2019 1925   CALCIUM 9.1 07/27/2019 1925   PROT 7.2 04/23/2019 1427   ALBUMIN 4.1 04/23/2019 1427   AST 14 04/23/2019 1427   ALT 10 04/23/2019 1427   ALKPHOS 84 04/23/2019 1427   BILITOT <0.2 04/23/2019 1427   GFRNONAA >60 07/27/2019 1925   GFRAA >60 07/27/2019 1925    Lab Results  Component Value Date   TSH 1.510 08/16/2019    Radiographic Findings: CT Soft Tissue Neck W Contrast  Result Date: 11/30/2019 CLINICAL DATA:  54 year old female with history of left parotid gland carcinoma treated with surgery and radiation. Restaging. EXAM: CT NECK WITH CONTRAST TECHNIQUE: Multidetector CT imaging of the neck was performed using the standard protocol following the bolus administration of intravenous contrast.  CONTRAST:  58mL OMNIPAQUE IOHEXOL 300 MG/ML  SOLN COMPARISON:  Brain MRI 09/17/2019.  Neck CT 02/09/2018. FINDINGS: Pharynx and larynx: Laryngeal and pharyngeal soft tissue contours are stable and within normal limits. Parapharyngeal and retropharyngeal spaces remain normal. Salivary glands: Sublingual space, right submandibular gland and right parotid gland are stable and within normal limits. The left submandibular gland has mildly atrophied since 2019. Sequelae of left parotid gland resection with mild residual granulation tissue in the left parotid space. Little if any residual parotid parenchyma. No suspicious enhancement or masslike soft tissue identified. The left stylomastoid foramen appears stable and within normal limits. Thyroid: Thyroid size at the upper limits of normal is stable since 2019. No discrete thyroid nodule. Lymph nodes: No cervical lymphadenopathy. Bilateral nodes are stable since 2019 and within normal limits. Vascular: Major vascular structures in the neck and at the skull base are patent. Minor right carotid bifurcation atherosclerosis has increased since 2019.  Limited intracranial: Negative. Visualized orbits: Negative. Mastoids and visualized paranasal sinuses: Left tympanic cavity and mastoids are clear. Other visualized paranasal sinuses and mastoids are stable and well pneumatized; mild chronic maxillary sinus mucosal thickening and/or retention cysts. Skeleton: Chronic carious right mandible molar. No acute or suspicious osseous lesion identified. Upper chest: Negative. IMPRESSION: 1. Satisfactory post treatment appearance of the neck. No mass or lymphadenopathy. 2. Chronic carious right mandible molar. Electronically Signed   By: Genevie Ann M.D.   On: 11/30/2019 04:28    Impression/Plan:    1) Head and Neck Cancer Status: we reviewed her imaging - status of disease is NED but dealing with presumed side effects of treatment - specifically, vertigo (severe). She continues to work on getting disability as she reports that the vertigo keeps her from functioning with work activities. ENT care is getting transferred due to insurance, financial issues.  2) Nutritional Status: Stable  PEG tube: No  3) Risk Factors: The patient has been educated about risk factors including alcohol and tobacco abuse; they understand that avoidance of alcohol and tobacco is important to prevent recurrences as well as other cancers.  She continues to smoke.  4) Swallowing: Functional.  5) Thyroid function: WNL. Unlikely to be affected by ipsilateral RT, re-check only if symptoms warrant.  Lab Results  Component Value Date   TSH 1.510 08/16/2019   7) Broken teeth, dental pain in setting of dry mouth: I have personally reached out to Sandi Mariscal DMD to see if pt is eligible for dental care despite financial issues.  Our team will also reach out to [place] on her behalf.  8) Rad/onc scheduling: f/u in 34mo, sooner PRN. I provided emotional support and encouragement today and told her to let us know if needs arise until then; she expressed deep gratitude for our care and  says "I wouldn't be here if not for you." I look forward to seeing her in the Spring.    On date of service, in total, I spent 25 minutes on this encounter. Patient was seen in person.  _____________________________________   Shelley Gibson, MD

## 2019-12-02 NOTE — Telephone Encounter (Signed)
CALLED PATIENT TO INFORM OF FU WITH DR. Isidore Moos ON 06-02-20 @ 2 PM, LVM FOR A RETURN CALL

## 2019-12-27 ENCOUNTER — Telehealth (INDEPENDENT_AMBULATORY_CARE_PROVIDER_SITE_OTHER): Payer: 59 | Admitting: Family Medicine

## 2019-12-27 ENCOUNTER — Encounter: Payer: Self-pay | Admitting: Family Medicine

## 2019-12-27 ENCOUNTER — Other Ambulatory Visit: Payer: Self-pay

## 2019-12-27 DIAGNOSIS — K219 Gastro-esophageal reflux disease without esophagitis: Secondary | ICD-10-CM | POA: Diagnosis not present

## 2019-12-27 DIAGNOSIS — F17210 Nicotine dependence, cigarettes, uncomplicated: Secondary | ICD-10-CM

## 2019-12-27 DIAGNOSIS — F3341 Major depressive disorder, recurrent, in partial remission: Secondary | ICD-10-CM

## 2019-12-27 DIAGNOSIS — Z599 Problem related to housing and economic circumstances, unspecified: Secondary | ICD-10-CM

## 2019-12-27 DIAGNOSIS — Z59819 Housing instability, housed unspecified: Secondary | ICD-10-CM

## 2019-12-27 NOTE — Progress Notes (Signed)
[place] Telemedicine Visit  Patient consented to have virtual visit and was identified by name and date of birth. Method of visit: Telephone  Encounter participants: Patient: Shelley Mason - located at home Provider: Martyn Malay - located at [facility] Others (if applicable): None   Chief Complaint: mood   HPI:  Shelley Mason is a pleasant 54 year old woman with history significant for parotid carcinoma status post resection, chronic vertigo, tobacco abuse attempting to quit and mood disorder presenting for follow-up.  At [place] visit patient reported a number of stressors related to her difficulty with her job and obtaining disability.  She currently is in classes and has a hard time focusing due to her mood disorder and her ongoing vertiginous symptoms.  She reports she has been unable to return to work and after receiving disability for 2 years her employer is no longer providing her with disability.  She is up to 3 months behind on utilities and she finds this incredibly stressful.  She reports because of this her mood is low.  She endorses up-and-down days.  She is often crying at night.  She has support through her family.  She is often able to find the good and things that she reports she denies thoughts of hurting herself or others.  The patient is taking her prescribed SSRI and Wellbutrin.  She reports she has been able to cut down on cigarettes and is smoking only 1 to 2/day.   ROS: per HPI  Pertinent PMHx:  History of parotid carcinoma Obesity Tobacco abuse Mood disorder  Exam:  PHQ reviewed.  She is appropriate and talkative but maintains a conversation and is easily redirected during the conversation.  Assessment/Plan:  Gastroesophageal reflux disease Reports symptoms are doing okay currently.  Cigarette smoker Congratulated her on cutting down to 1 to 2 cigarettes/day despite stressors.  Encouraged continued  cessation.  Depression Discussed in context of larger stressors currently affecting her mood.  He did some brainstorm around problem-solving related to classes and her current housing situation.  Referral placed to chronic care management to which the patient was amenable and appreciative.  She is looking into different avenues to reduce her bills.  We will continue medication at current dose at this time    Time spent during visit with patient: 9.  Minutes   Follow up in 2 weeks for check in on symptoms, smoking.

## 2019-12-27 NOTE — Assessment & Plan Note (Signed)
Reports symptoms are doing okay currently.

## 2019-12-27 NOTE — Assessment & Plan Note (Signed)
Discussed in context of larger stressors currently affecting her mood.  He did some brainstorm around problem-solving related to classes and her current housing situation.  Referral placed to chronic care management to which the patient was amenable and appreciative.  She is looking into different avenues to reduce her bills.  We will continue medication at current dose at this time

## 2019-12-27 NOTE — Assessment & Plan Note (Signed)
Congratulated her on cutting down to 1 to 2 cigarettes/day despite stressors.  Encouraged continued cessation.

## 2019-12-28 ENCOUNTER — Telehealth: Payer: Self-pay | Admitting: Family Medicine

## 2019-12-28 NOTE — Telephone Encounter (Signed)
   [place] 12/28/2019   Name: Shelley Mason   MRN: [id number]   DOB: [birth year]   AGE: 54 y.o.   GENDER: female   PCP Martyn Malay, MD.    Spoke with patient today regarding referral for assistance with housing and utilities. Patient stated that she is still in need. [name] stated she is no longer receiving disability payments, but she has a hearing coming up soon. Informed [name] that [place] will see what resources in the area might be able to assist her. Patient stated understanding and that she will be looking for resources as well. Care Guide will give patient a call for follow up within the week.   [place], Care Management Phone: [phone] Email: [email or web address]

## 2019-12-30 NOTE — Telephone Encounter (Signed)
   [place] 12/30/2019   Name: Shelley Mason   MRN: [id number]   DOB: 19-Nov-1965   AGE: 54 y.o.   GENDER: female   PCP Martyn Malay, MD.   Spoke with patient regarding referral for housing and utility assistance. Patient stated that she has contacted the Emergency Rental and Assistance Program for [facility] on 12/29/19 and is waiting for a response. Informed patient that Care Guide has contacted [employer] left message and is waiting to hear back from them. Patient asked Care Guide to e-mail any additional resources regarding housing and utilities. Care Guide will send e-mail to patient.    [place], Care Management Phone: [phone] Email: [email or web address]

## 2020-01-06 NOTE — Telephone Encounter (Signed)
   [place] 01/06/2020   Name: Shelley Mason   MRN: [id number]   DOB: 11-Sep-1965   AGE: 54 y.o.   GENDER: female   PCP Martyn Malay, MD.    Called Ms. Shelley Mason as a follow up call to see if she has been able to contact any of the resources that were given to her. Patient stated that she has completed applications to different organizations and is waiting to hear back from them. Patient was thankful for all the assistance that she received from the Care Guide and stated that she has no additional needs at this time. Informed Ms. Shelley Mason that if she has any additional needs she can give the office a call. Patient stated understanding.   Closing referral pending any other needs of patient.    [place], Care Management Phone: [phone] Email: [email or web address]

## 2020-01-14 ENCOUNTER — Encounter: Payer: Self-pay | Admitting: Family Medicine

## 2020-01-14 ENCOUNTER — Telehealth (INDEPENDENT_AMBULATORY_CARE_PROVIDER_SITE_OTHER): Payer: 59 | Admitting: Family Medicine

## 2020-01-14 DIAGNOSIS — K219 Gastro-esophageal reflux disease without esophagitis: Secondary | ICD-10-CM

## 2020-01-14 DIAGNOSIS — F3341 Major depressive disorder, recurrent, in partial remission: Secondary | ICD-10-CM

## 2020-01-14 DIAGNOSIS — Z85818 Personal history of malignant neoplasm of other sites of lip, oral cavity, and pharynx: Secondary | ICD-10-CM

## 2020-01-14 DIAGNOSIS — F17210 Nicotine dependence, cigarettes, uncomplicated: Secondary | ICD-10-CM

## 2020-01-14 DIAGNOSIS — H8192 Unspecified disorder of vestibular function, left ear: Secondary | ICD-10-CM

## 2020-01-14 MED ORDER — MECLIZINE HCL 25 MG PO TABS: 25.0000 mg | ORAL_TABLET | Freq: Every day | ORAL | 0 refills | Status: DC | PRN

## 2020-01-14 NOTE — Progress Notes (Signed)
[place] Telemedicine Visit  Patient consented to have virtual visit and was identified by name and date of birth. Method of visit: Telephone Encounter participants: Patient: Shelley Mason - located at home Provider: Martyn Malay - located at [facility] Others (if applicable): None   Chief Complaint: check in   HPI:  Shelley Mason is a very pleasant 54 yo woman presenting for a check in on mood via telephone visit.  At the last visit, she had a number of financial and home stressors. Her disability case is December 7th. She reports her aunt recently died, which caused significant stress---she was like ' a second mother'. Denies SI/HI. Reports she stopped wellbutrin due to nausea and anxiety with this. She did not find it helpful. She is taking Prozac and finds this helpful. She was pleased with CCM consultation. She does not yet have a counselor through [facility].   She has ongoing dizziness and hearing concerns related to prior parotid tumor s/p resection. She is taking meclizine 2 times per day. Endorses side effects of dry mouth. She also has memory concerns, which she relates to her stress as will as prior cancer. She reports she forgot her partner mowed the lawn yesterday. She feels like day blur. No falls or family concerns. She has had recent imaging.   ROS: per HPI  Pertinent PMHx: reviewed and updated   Exam:  Pleasant, focused, appropriate   Assessment/Plan:  Gastroesophageal reflux disease Doing well, continue PPI. Will discuss GI follow up.   Vestibular disorder Discussed memory concerns and dry mouth may be related to antihistamine--goal to reduce to once a day, consider vestibular PT In future.   Cigarette smoker Discussed today---she is down to 10 per day, did not tolerate buproprion (also for mood). Will continue behavioral intervention. She is to contact [facility] by end of next week.   Depression Discussed, set goal of  calling [facility] (or emailing). Continue SSRI at this time.   History of malignant neoplasm of parotid gland Provided number for her to call ENT---she is to reach out if she cannot get appointment.     Time spent during visit with patient: 13 minutes

## 2020-01-14 NOTE — Assessment & Plan Note (Signed)
Provided number for her to call ENT---she is to reach out if she cannot get appointment.

## 2020-01-14 NOTE — Assessment & Plan Note (Signed)
Discussed today---she is down to 10 per day, did not tolerate buproprion (also for mood). Will continue behavioral intervention. She is to contact [facility] by end of next week.

## 2020-01-14 NOTE — Assessment & Plan Note (Signed)
Discussed memory concerns and dry mouth may be related to antihistamine--goal to reduce to once a day, consider vestibular PT In future.

## 2020-01-14 NOTE — Assessment & Plan Note (Signed)
Discussed, set goal of calling [facility] (or emailing). Continue SSRI at this time.

## 2020-01-14 NOTE — Assessment & Plan Note (Addendum)
Doing well, continue PPI. Will discuss GI follow up.

## 2020-01-25 ENCOUNTER — Ambulatory Visit (INDEPENDENT_AMBULATORY_CARE_PROVIDER_SITE_OTHER): Payer: 59 | Admitting: Family Medicine

## 2020-01-25 ENCOUNTER — Other Ambulatory Visit: Payer: Self-pay

## 2020-01-25 ENCOUNTER — Encounter: Payer: Self-pay | Admitting: Family Medicine

## 2020-01-25 VITALS — BP 114/74 | HR 108 | Wt 210.2 lb

## 2020-01-25 DIAGNOSIS — Z85818 Personal history of malignant neoplasm of other sites of lip, oral cavity, and pharynx: Secondary | ICD-10-CM

## 2020-01-25 DIAGNOSIS — K219 Gastro-esophageal reflux disease without esophagitis: Secondary | ICD-10-CM

## 2020-01-25 DIAGNOSIS — F17210 Nicotine dependence, cigarettes, uncomplicated: Secondary | ICD-10-CM

## 2020-01-25 DIAGNOSIS — F3341 Major depressive disorder, recurrent, in partial remission: Secondary | ICD-10-CM

## 2020-01-25 DIAGNOSIS — H8192 Unspecified disorder of vestibular function, left ear: Secondary | ICD-10-CM

## 2020-01-25 DIAGNOSIS — E669 Obesity, unspecified: Secondary | ICD-10-CM

## 2020-01-25 DIAGNOSIS — R1319 Other dysphagia: Secondary | ICD-10-CM

## 2020-01-25 LAB — POCT GLYCOSYLATED HEMOGLOBIN (HGB A1C): Hemoglobin A1C: 5.5 % (ref 4.0–5.6)

## 2020-01-25 MED ORDER — DICLOFENAC SODIUM 1 % EX GEL
CUTANEOUS | 1 refills | Status: DC
Start: 2020-01-25 — End: 2020-04-03

## 2020-01-25 MED ORDER — OMEPRAZOLE 40 MG PO CPDR: 40.0000 mg | DELAYED_RELEASE_CAPSULE | Freq: Every day | ORAL | 3 refills | Status: DC

## 2020-01-25 NOTE — Assessment & Plan Note (Signed)
Called ENT at [facility] but did not hear back. No change in symptoms or exam, will reach out the referrals coordinator.

## 2020-01-25 NOTE — Assessment & Plan Note (Signed)
Discussed side effects of meclizine, attempt to reduce to once per day after finals.

## 2020-01-25 NOTE — Assessment & Plan Note (Signed)
Discussed benefits of quitting, encouraged cessation/descreasing #. She is down to 10 per day, set quit time in January to which she would like to achieve.

## 2020-01-25 NOTE — Assessment & Plan Note (Signed)
Continue prozac, responding well. Discussed sleep strategies. PHQ reviewed.

## 2020-01-25 NOTE — Assessment & Plan Note (Signed)
Discussed increasing activity, A1C 5.5, congratulated. Encouraged body positive thinking.

## 2020-01-25 NOTE — Assessment & Plan Note (Signed)
Well controlled, refill PPI. Has had prior endoscopy which demonstrated no abnormalities.

## 2020-01-25 NOTE — Progress Notes (Signed)
SUBJECTIVE:   CHIEF COMPLAINT / HPI:   Shelley Mason is a pleasant 54 year old with history of parotid carcinoma s/p resection and RT, obesity, tobacco abuse, and major depression presenting for follow up.   Mood The patient reports her mood is doing okay.  She is examined.  She is working for the holiday.  She reports her sleep is still poor.  She is tolerating Prozac well in case it is benefiting her.  She denies thoughts of hurting herself or others.  PHQ-9 is reviewed.  Vertigo The patient is taking meclizine twice per day now.  She has persistent episodic vertigo.  No recent falls.  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  She has not yet heard from the ear nose and throat physician.  She tried to call the office and left a voicemail but has not heard back.   Weight The patient reports she has gained a few pounds and wonders about this.  No specific change in diet.  She has been very active recently and is trying to lose weight.  Of note she has been cutting down on cigarettes.  Tobacco  The patient is very interested in quitting smoking.  She has decreased from 15 to 10 cigarettes/day.  She reports her triggers are her stress in her classes specifically.  She is interested in setting a quit date in January.  She does not think she can quit prior to her classes for the holiday season.  She also has an upcoming date for disability on December 7 which is another stressor PERTINENT  PMH / PSH/Family/Social History : Reviewed and updated as appropriate  OBJECTIVE:   BP 114/74   Pulse (!) 108   Wt 210 lb 3.2 oz (95.3 kg)   SpO2 98%   BMI 36.08 kg/m   Today's weight:  Last Weight  Most recent update: 01/25/2020  8:28 AM   Weight  95.3 kg (210 lb 3.2 oz)           Review of prior weights: [employer]   01/25/20 0826  Weight: 210 lb 3.2 oz (95.3 kg)   HEENT exam is notable for clear tympanic membranes with scarring of  the left TM.  External auditory canals are normal appearance without cerumen.  Left sternocleidomastoid and parotid area with scarring and some overlying hard tissue that is unchanged from prior.  There is no cervical or supraclavicular adenopathy.  Cardiac: Regular rate and rhythm. Normal S1/S2. No murmurs, rubs, or gallops appreciated. Lungs: Clear bilaterally to ascultation.  Psych: Pleasant and appropriate   PHQ9 SCORE ONLY 01/25/2020 11/26/2019 09/20/2019  PHQ-9 Total Score 15 19 16       ASSESSMENT/PLAN:   Gastroesophageal reflux disease Well controlled, refill PPI. Has had prior endoscopy which demonstrated no abnormalities.   Obesity (BMI 35.0-39.9 without comorbidity) Discussed increasing activity, A1C 5.5, congratulated. Encouraged body positive thinking.   History of malignant neoplasm of parotid gland Called ENT at [facility] but did not hear back. No change in symptoms or exam, will reach out the referrals coordinator.   Cigarette smoker Discussed benefits of quitting, encouraged cessation/descreasing #. She is down to 10 per day, set quit time in January to which she would like to achieve.   Vestibular disorder Discussed side effects of meclizine, attempt to reduce to once per day after finals.   Depression Continue prozac, responding well. Discussed sleep strategies. PHQ reviewed.    HCM  We will see if I can obtain records for colonoscopy that was done in July 2020.  She is scheduled for her booster Covid vaccine after December 14.  She will be 6 months from her Covid restart this time.  Dorris Singh, [place]

## 2020-01-25 NOTE — Patient Instructions (Signed)
It was wonderful to see you today.  Please bring ALL of your medications with you to every visit.   Today we talked about:  - I will reach out to our referrals coordinator  About ENT    - I sent in refills  - Return for your COVID booster   - Getting 30 minutes of exercise 5 times per week--dance-walk-play with kids in the family   Thank you for choosing [facility].   Please call [phone] with any questions about today's appointment.  Please be sure to schedule follow up at the front  desk before you leave today.   Dorris Singh, MD  Family Medicine

## 2020-01-26 NOTE — Progress Notes (Signed)
Oncology Nurse Navigator Documentation  I was asked by Dr. Isidore Moos to help Shelley Mason get scheduled to see a dentist for her current dental problems. I have attempted multiple times and left several voicemails with [facility] recently. Today I attempted to call again and was able to speak to somebody at [facility]. She has sent a message to the schedulers to get Shelley Mason scheduled as soon as possible for evaluation. I have called Shelley Mason to let her know that she should be receiving a call to get scheduled at [facility]. She voiced her appreciation. I provided her with my direct contact information and asked her to contact me if she does not hear from them by the end of next week.   Harlow Asa RN, BSN, OCN Head & Neck Oncology Nurse [place] at [facility] Phone # [phone]  Fax # [phone]

## 2020-02-18 ENCOUNTER — Ambulatory Visit: Payer: 59

## 2020-02-22 ENCOUNTER — Other Ambulatory Visit: Payer: Self-pay

## 2020-02-22 ENCOUNTER — Ambulatory Visit: Payer: 59

## 2020-02-28 ENCOUNTER — Encounter (HOSPITAL_COMMUNITY): Payer: Self-pay

## 2020-02-28 ENCOUNTER — Telehealth: Payer: Self-pay

## 2020-02-28 ENCOUNTER — Emergency Department (HOSPITAL_COMMUNITY)
Admission: EM | Admit: 2020-02-28 | Discharge: 2020-02-28 | Disposition: A | Discharge status: Home / Self Care | Payer: 59 | Attending: Emergency Medicine | Admitting: Emergency Medicine

## 2020-02-28 DIAGNOSIS — Z85818 Personal history of malignant neoplasm of other sites of lip, oral cavity, and pharynx: Secondary | ICD-10-CM | POA: Diagnosis not present

## 2020-02-28 DIAGNOSIS — Z85858 Personal history of malignant neoplasm of other endocrine glands: Secondary | ICD-10-CM | POA: Insufficient documentation

## 2020-02-28 DIAGNOSIS — U071 COVID-19: Secondary | ICD-10-CM | POA: Insufficient documentation

## 2020-02-28 DIAGNOSIS — Z20822 Contact with and (suspected) exposure to covid-19: Secondary | ICD-10-CM

## 2020-02-28 DIAGNOSIS — F17210 Nicotine dependence, cigarettes, uncomplicated: Secondary | ICD-10-CM | POA: Diagnosis not present

## 2020-02-28 DIAGNOSIS — R059 Cough, unspecified: Secondary | ICD-10-CM | POA: Diagnosis present

## 2020-02-28 LAB — RESP PANEL BY RT-PCR (FLU A&B, COVID) ARPGX2
Influenza A by PCR: NEGATIVE
Influenza B by PCR: NEGATIVE
SARS Coronavirus 2 by RT PCR: POSITIVE — AB

## 2020-02-28 NOTE — ED Triage Notes (Signed)
Pt presents with c/o covid exposure that occurred on Saturday. Pt now reports a cough.

## 2020-02-28 NOTE — ED Provider Notes (Signed)
[facility] Provider Note   CSN: [id number] Arrival date & time: 02/28/20  1020     History Chief Complaint  Patient presents with   Covid Exposure   Cough    Shelley Mason is a 54 y.o. female with PMH significant for OSA and tobacco use who presents to the ED with known COVID-19 exposure 02/26/2020 who presents to the ED for COVID-19 testing.    On my examination, patient tells me that her niece tested positive for COVID-19 yesterday.  She states that she spent Christmas eve and Christmas day with her niece and she is concerned given her history of cancer and other risk factors.  She denies any symptoms at present aside from one episode of chills yesterday.  Specifically, she denies any chest pain, shortness of breath, cough, diminished appetite, nausea or vomiting, headache or dizziness, unilateral extremity swelling or edema, or other symptoms.  She has been cancer free for 2 years.  Patient is fully immunized for COVID-19.  She states that her fianc is in the next room also being tested.  HPI     Past Medical History:  Diagnosis Date   Anemia    history of   Anxiety    Cigarette smoker    Depression    Fibroids    history of uterine fibroids   GERD (gastroesophageal reflux disease)    Hearing loss    Hearing loss    History of radiation therapy 09/22/17- 11/06/17   tumor bed left neck with added margin o facial nerve.    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     Family History  Problem Relation Age of Onset   Hyperlipidemia Mother    Hypertension Mother    Heart failure Mother    Depression Mother    Alcohol abuse Mother    Prostate cancer Father    Alcohol abuse Father    Hyperlipidemia Sister    Heart disease Other        family history   Diabetes Other        family history   Hypertension Other        family history   Colon cancer Neg Hx    Colon polyps Neg Hx    Esophageal cancer Neg Hx    Rectal cancer Neg Hx    Stomach cancer Neg Hx     Social History   Tobacco Use   Smoking status: Current Every Day Smoker    Packs/day: 1.00    Years: 19.00    Pack years: 19.00    Types: Cigarettes    Start date: 06/20/2000   Smokeless tobacco: Never Used   Tobacco comment: She had  quit, but restarted due to stress.   Vaping Use   Vaping Use: Never used  Substance Use Topics   Alcohol use: Not Currently    Alcohol/week: 0.0 standard drinks    Comment: occasionally   Drug use: No    Home Medications Prior to Admission medications   Medication Sig Start Date End Date Taking? Authorizing Provider  diclofenac Sodium (VOLTAREN) 1 % GEL Apply four times daily to [facility] 01/25/20   Martyn Malay, MD  FLUoxetine (PROZAC) 10 MG capsule Take 1 capsule (10 mg total) by mouth daily. 11/26/19   Martyn Malay, MD  meclizine (ANTIVERT) 25 MG tablet Take 1 tablet (25 mg total) by mouth daily as needed for dizziness. 01/14/20   Martyn Malay, MD  nicotine (NICODERM CQ) 21 mg/24hr patch Place 1 patch (21 mg total) onto the skin daily. 08/16/19   Martyn Malay, MD  omeprazole (PRILOSEC) 40 MG capsule Take 1 capsule (40 mg total) by mouth daily. 01/25/20   Martyn Malay, MD  sodium fluoride  (PREVIDENT 5000 PLUS) 1.1 % CREA dental cream Apply cream to tooth brush. Brush teeth for 2 minutes. Spit out excess. DO NOT rinse afterwards. Repeat nightly. 08/16/19   Martyn Malay, MD  sucralfate (CARAFATE) 1 g tablet Take 1 tablet (1 g total) by mouth 2 (two) times daily as needed. 06/16/19   Doran Stabler, MD    Allergies    Penicillins and Codeine  Review of Systems   Review of Systems  All other systems reviewed and are negative.   Physical Exam Updated Vital Signs BP 128/88 (BP Location: Left Arm)    Pulse 89    Temp 98.5 F (36.9 C) (Oral)    Resp 16    Ht 5\' 4"  (1.626 m)    Wt 86.2 kg    BMI 32.61 kg/m   Physical Exam Vitals and nursing note reviewed. Exam conducted with a chaperone present.  Constitutional:      General: She is not in acute distress.    Appearance: Normal appearance. She is not ill-appearing.  HENT:     Head: Normocephalic and atraumatic.  Eyes:     General: No scleral icterus.    Conjunctiva/sclera: Conjunctivae normal.  Cardiovascular:     Rate and Rhythm: Normal rate and regular rhythm.     Pulses: Normal pulses.     Heart sounds: Normal heart sounds.  Pulmonary:     Effort: Pulmonary effort is normal. No respiratory distress.     Breath sounds: Normal breath sounds. No wheezing or rales.     Comments: CTA bilaterally.  No increased work of breathing.  Speaks in full sentences. Musculoskeletal:     Right lower leg: No edema.     Left lower leg: No edema.  Skin:    General: Skin is dry.  Neurological:     Mental Status: She is alert and oriented to person, place, and time.     GCS: GCS eye subscore is 4. GCS verbal subscore is 5. GCS motor subscore is 6.  Psychiatric:        Mood and Affect: Mood normal.        Behavior: Behavior normal.        Thought Content: Thought content normal.     ED Results / Procedures / Treatments   Labs (all labs ordered are listed, but only abnormal results are displayed) Labs Reviewed  RESP PANEL  BY RT-PCR (FLU A&B, COVID) ARPGX2  EKG None  Radiology No results found.  Procedures Procedures (including critical care time)  Medications Ordered in ED Medications - No data to display  ED Course  I have reviewed the triage vital signs and the nursing notes.  Pertinent labs & imaging results that were available during my care of the patient were reviewed by me and considered in my medical decision making (see chart for details).    MDM Rules/Calculators/A&P                          Patient is in the ED for COVID-19 testing.  She had an episode of chills yesterday, but is otherwise entirely asymptomatic.  Given her risk factors however, she wanted to be tested here in the ED.  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  Respiratory panel by PCR is pending.  Do not need to wait for the results.  Given brevity of illness and lack of any chest pain/shortness of breath/cough, do not feel as though imaging of the chest is warranted.   Discussed that antibiotics are not indicated for viral infections.  Patient will be discharged with symptomatic treatment.  Patient is tolerating food and liquid without difficulty and I do not believe that laboratory work-up would yield any significant findings.  I emphasized the importance of rest, continued oral hydration, and antipyretics as needed for fever control.    They were provided opportunity to ask any additional questions and have none at this time.  Prior to discharge patient is feeling well, agreeable with plan for discharge home.  They have expressed understanding of verbal discharge instructions as well as return precautions and are agreeable to the plan.   [place] Sandquist was evaluated in Emergency Department on 02/28/2020 for the symptoms described in the history of present illness. She was evaluated in the context of the global COVID-19 pandemic, which  necessitated consideration that the patient might be at risk for infection with the SARS-CoV-2 virus that causes COVID-19. Institutional protocols and algorithms that pertain to the evaluation of patients at risk for COVID-19 are in a state of rapid change based on information released by regulatory bodies including the CDC and federal and state organizations. These policies and algorithms were followed during the patient's care in the ED.   Final Clinical Impression(s) / ED Diagnoses Final diagnoses:  Close exposure to COVID-19 virus    Rx / DC Orders ED Discharge Orders    None       Reita Chard 02/28/20 1207    Quintella Reichert, MD 02/28/20 [phone]

## 2020-02-28 NOTE — Telephone Encounter (Signed)
Called patient about COVID.  On Saturday, patient had episode of vertigo Patient fell and hit face. Evaluated in ED today. Will check status of ENT referral.  Also discussed COVID symptoms, reasons to return to care and call. Referred for MAB.  Terisa Starr, MD  Family Medicine Teaching Service

## 2020-02-28 NOTE — Discharge Instructions (Addendum)
You have been tested for COVID-19 and influenza.  Please follow-up with your primary care provider regarding today's encounter.  You can check your temperature regularly take Tylenol as needed for fever control.  Given your comorbidities, you would likely benefit from monoclonal antibody infusion if you are positive.  Please call them at [phone] to see if you qualify.  Return to the ED or seek immediate medical attention should you experience any new or worsening symptoms.

## 2020-02-28 NOTE — Telephone Encounter (Signed)
Patient calls nurse line reporting she tested positive for covid today. Patient reports she was supposed to get her booster vaccine on 12/29, however we have cancelled. Patient would like for PCP to call her to discuss some things and to see when she is able to get the booster. Please advise.

## 2020-02-29 ENCOUNTER — Other Ambulatory Visit: Payer: 59

## 2020-03-01 ENCOUNTER — Ambulatory Visit: Payer: 59

## 2020-03-17 IMAGING — CT CT HEAD W/O CM
3 series · 15 of 46 positions shown, 18 images · non-contrast
Comparison: None.

CLINICAL DATA: Syncopal episode. Dizziness. Unsure if hit head.
Currently on radiation treatment for parotid cancer.

EXAM:
CT HEAD WITHOUT CONTRAST
TECHNIQUE: Contiguous axial images were obtained from the base of the skull
through the vertex without intravenous contrast.

[Series 2: head wo · axial · 0.50mm/px · z∈[+1561,+1681]mm · 9 of 29 slices shown, 12 images]
[im 3/29  brain]
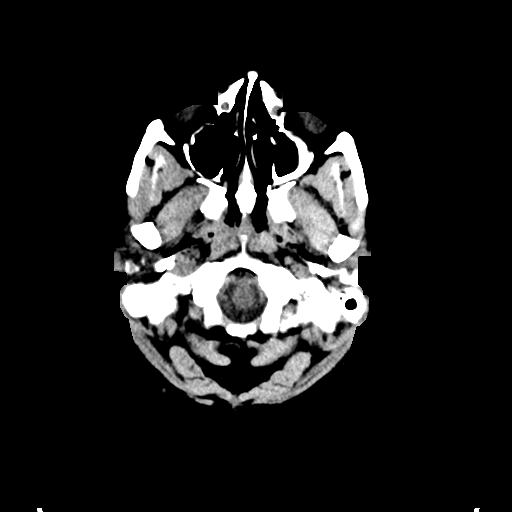
[im 3/29  bone]
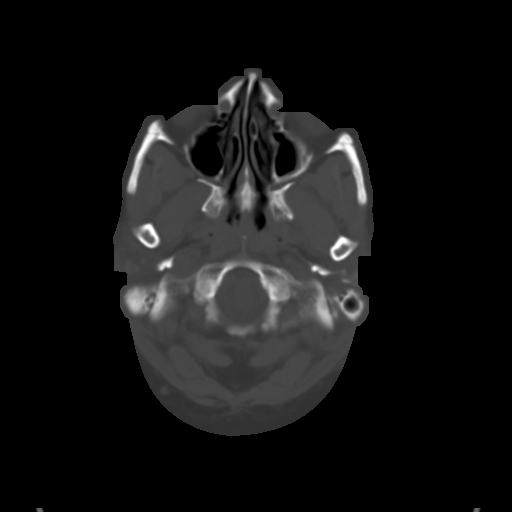
[im 6/29  brain]
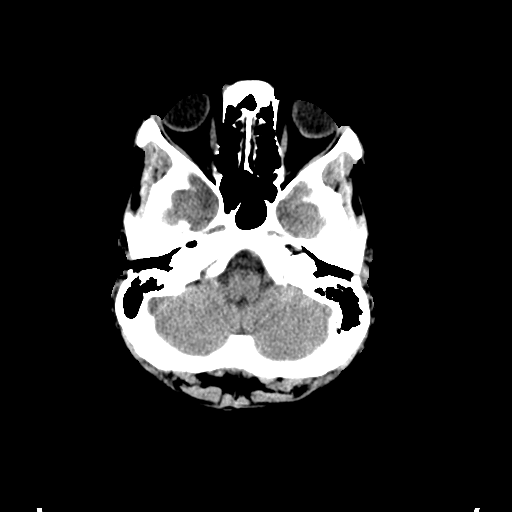
[im 9/29  brain]
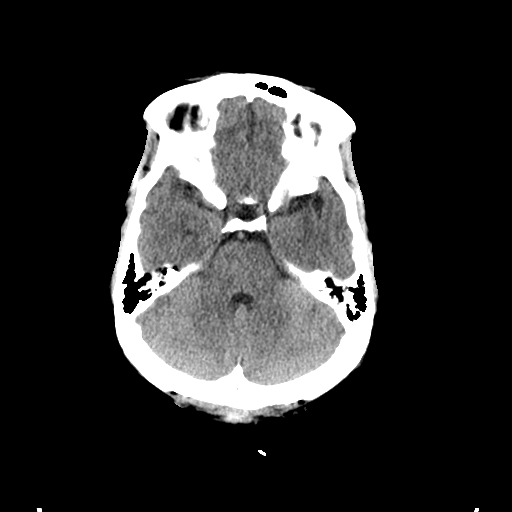
[im 12/29  brain]
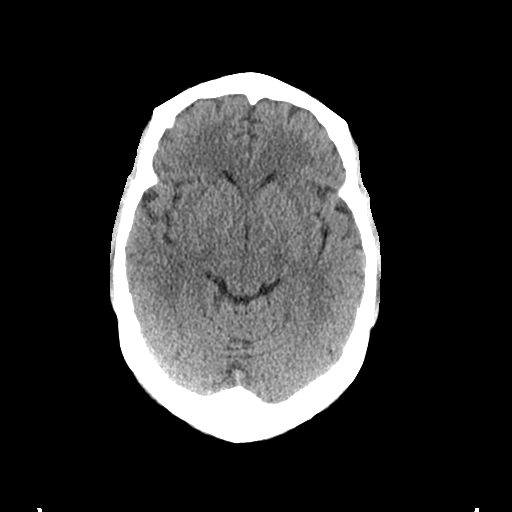
[im 15/29  brain]
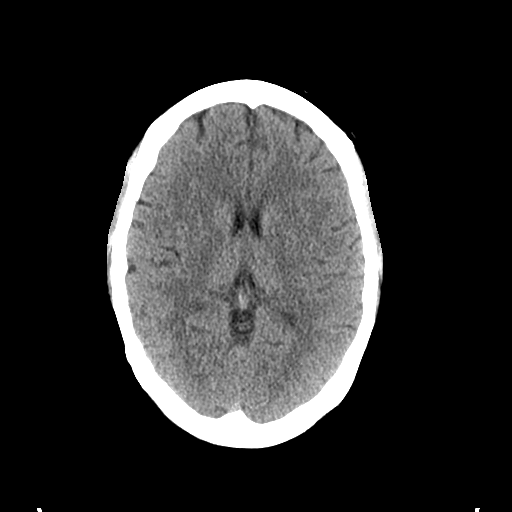
[im 15/29  bone]
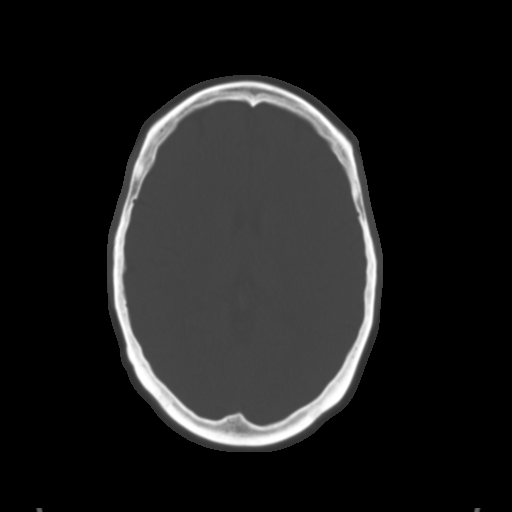
[im 18/29  brain]
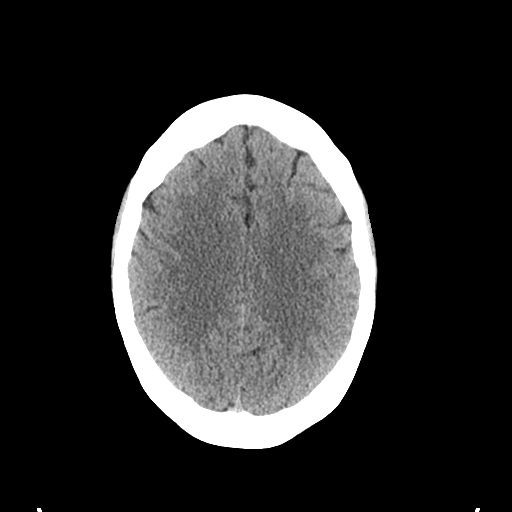
[im 21/29  brain]
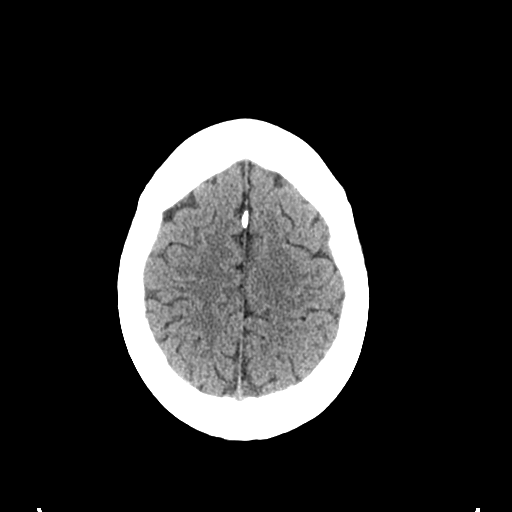
[im 24/29  brain]
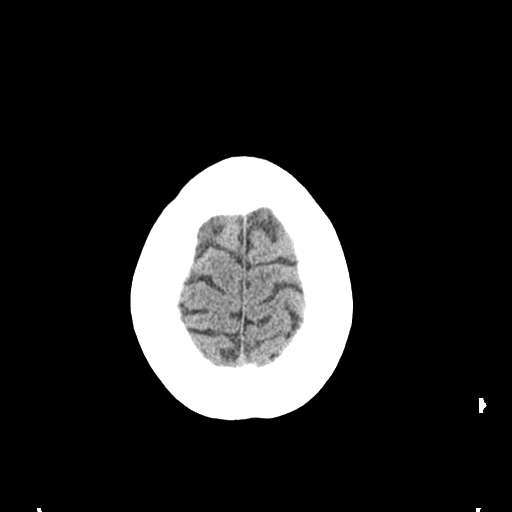
[im 27/29  brain]
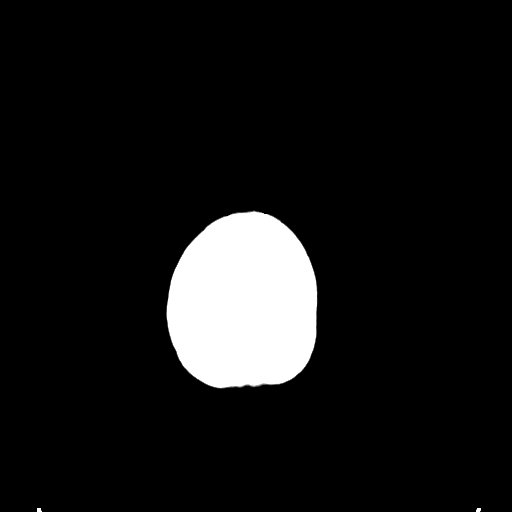
[im 27/29  bone]
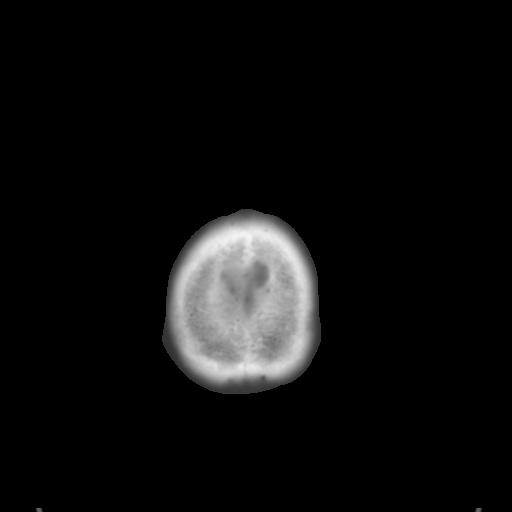

[Series 4: coronal soft tissue · coronal · 0.28mm/px · 3 of 63 slices shown]
[im 21/63  brain]
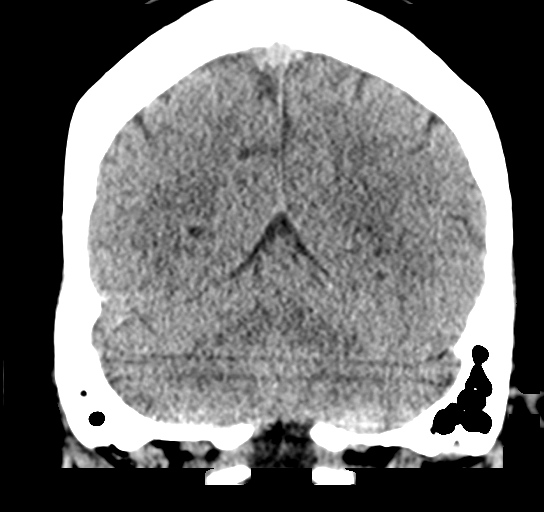
[im 28/63  brain]
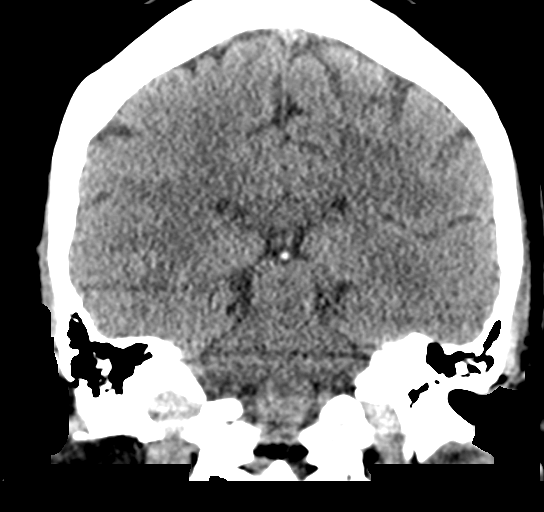
[im 35/63  brain]
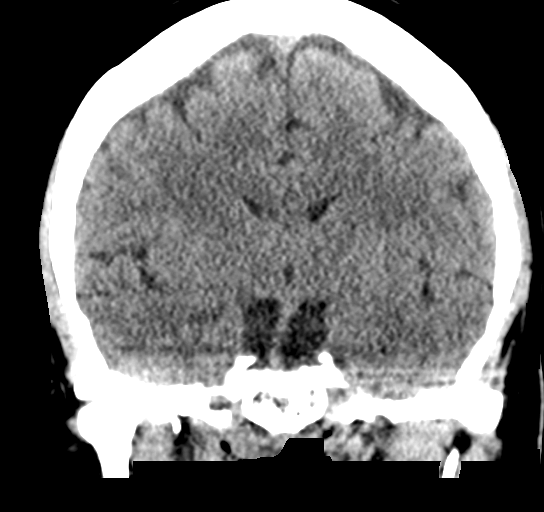

[Series 5: sagittal soft tissue · sagittal · 0.28mm/px · 3 of 49 slices shown]
[im 17/49  brain]
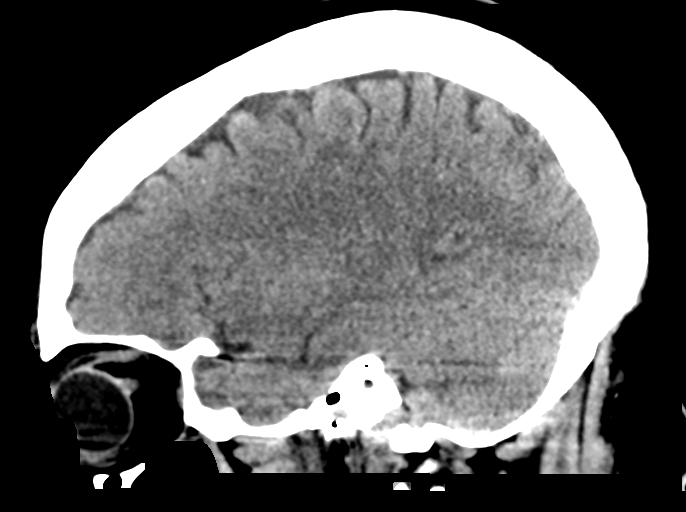
[im 25/49  brain]
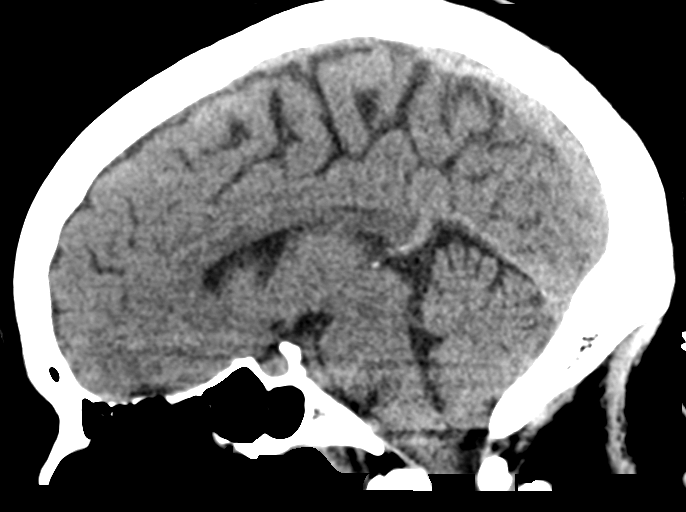
[im 33/49  brain]
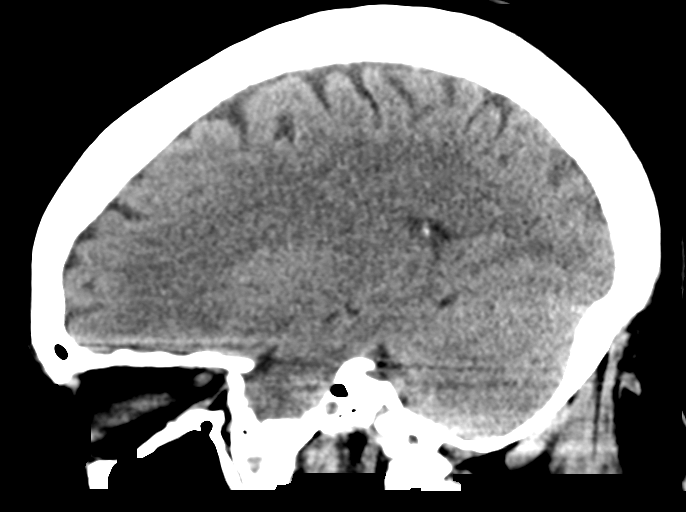

[15 of 46 positions shown; findings below may reference images not displayed]

FINDINGS: Brain: No evidence of acute infarction, hemorrhage, hydrocephalus,
extra-axial collection or mass lesion/mass effect.

Vascular: No hyperdense vessel or unexpected calcification.

Skull: Normal. Negative for fracture or focal lesion.

Sinuses/Orbits: Mucosal thickening and retention cysts in the
maxillary antra. Paranasal sinuses and mastoid air cells are
otherwise clear.

Other: None.
IMPRESSION: No acute intracranial abnormalities.

## 2020-04-03 ENCOUNTER — Other Ambulatory Visit: Payer: Self-pay

## 2020-04-03 MED ORDER — DICLOFENAC SODIUM 1 % EX GEL
CUTANEOUS | 1 refills | Status: DC
Start: 2020-04-03 — End: 2021-04-03

## 2020-04-28 ENCOUNTER — Telehealth: Payer: Self-pay | Admitting: *Deleted

## 2020-04-28 NOTE — Telephone Encounter (Signed)
Received refill request for bupropion--please call patient and see if restarted. Prior documentation states she was not taking.  Thank you, Dorris Singh, MD  [facility] Medicine Teaching Service

## 2020-05-01 ENCOUNTER — Other Ambulatory Visit: Payer: Self-pay

## 2020-05-01 MED ORDER — BUPROPION HCL ER (SR) 150 MG PO TB12: 150.0000 mg | ORAL_TABLET | Freq: Two times a day (BID) | ORAL | 3 refills | Status: DC

## 2020-05-02 NOTE — Telephone Encounter (Signed)
Patient is not taking bupropion.  Patient states that she is coming in 05/03/2020 and has been placed in ATC.  Patient really wants an appointment with PCP who is not available until April.  Advised patient to keep appointment with ATC (Dr. Ouida Sills) and send message to Dr. Owens Shark via [facility].  Patient verbalized understanding and agreed.  Ozella Almond, [place]

## 2020-05-03 ENCOUNTER — Ambulatory Visit: Payer: 59 | Admitting: Student in an Organized Health Care Education/Training Program

## 2020-05-03 ENCOUNTER — Telehealth: Payer: Self-pay

## 2020-05-03 ENCOUNTER — Other Ambulatory Visit: Payer: Self-pay

## 2020-05-03 VITALS — BP 120/72 | HR 106 | Wt 202.8 lb

## 2020-05-03 DIAGNOSIS — K1379 Other lesions of oral mucosa: Secondary | ICD-10-CM

## 2020-05-03 DIAGNOSIS — R220 Localized swelling, mass and lump, head: Secondary | ICD-10-CM | POA: Diagnosis not present

## 2020-05-03 NOTE — Patient Instructions (Signed)
It was a pleasure to see you today!  To summarize our discussion for this visit:  We need to be extra cautious with any growths in your mouth due to your history of cancer, radiation, and smoking. I am ordering a repeat CT to scan the area.   Please see some options below for free or reduced pricing dentists in the area.   Follow up with your oncologist as scheduled and possibly sooner if CT results come back concerning.   Some additional health maintenance measures we should update are: Health Maintenance Due  Topic Date Due  . COVID-19 Vaccine (3 - Pfizer risk 4-dose series) 09/13/2019  .    Call the clinic at [phone] if your symptoms worsen or you have any concerns.   Thank you for allowing me to take part in your care,  Dr. Doristine Mango  [place], [place] - [address] [phone]  [place], [place] [address] [phone]  [address], [place] - [address] [phone]  [place], [place] - [address] [phone]

## 2020-05-03 NOTE — Progress Notes (Signed)
   SUBJECTIVE:   CHIEF COMPLAINT / HPI: chin mass  Patient who has history of parotid gland cancer status post surgical removal and radiation presents with 2-week history of mass in left cheek area.  The mass has not changed since she first noticed it 2 weeks ago.  Endorses tenderness when eating or touching the mass.  Located inside her mouth and has not seen any changes on the skin, gums, teeth.  Denies fever or drainage.  Has not noticed any changes in her salivary excretion and has none on that side. No voice changes, difficulties with swallowing or breathing. Has not tried any treatments for it. Last radiation was 2018 CT 9/21 was negative for cancer 6 months ago saw oncologist Next month follow up Continues to smoke 1ppd  OBJECTIVE:   BP 120/72   Pulse (!) 106   Wt 202 lb 12.8 oz (92 kg)   SpO2 97%   BMI 34.81 kg/m   Physical Exam Vitals and nursing note reviewed.  Constitutional:      Appearance: Normal appearance. She is obese. She is not ill-appearing.  HENT:     Head: Normocephalic and atraumatic.      Nose: Nose normal.     Mouth/Throat:     Mouth: Mucous membranes are moist.     Pharynx: Oropharynx is clear.  Eyes:     Conjunctiva/sclera: Conjunctivae normal.  Cardiovascular:     Rate and Rhythm: Normal rate and regular rhythm.  Pulmonary:     Effort: Pulmonary effort is normal.  Musculoskeletal:     Cervical back: Normal range of motion. No rigidity or tenderness.  Lymphadenopathy:     Cervical: No cervical adenopathy.  Skin:    General: Skin is warm and dry.  Neurological:     Mental Status: She is alert.    ASSESSMENT/PLAN:   Mass of mouth Patient presented for new mass in the left buccal surface present for 2 weeks.  History is significant for parotid gland cancer and radiation on that same side and patient continues to be 1 pack/day smoker.  These increase her risk factors for recurrent cancer. Despite having a normal follow-up CT 9/21 will order  repeat CT at this time to evaluate. When I described the mass is feeling like a lymph node to the patient, she expressed that this is what they said the first time she was diagnosed with cancer.     [place]

## 2020-05-03 NOTE — Telephone Encounter (Signed)
Called patient concerning appointment for CT.  Patient has [facility] and states that she will retrieve information from there.  CT Soft Tissue Neck  w/contrast  05/18/2020 1600 with 1545 arrival [facility] [address] [phone] NPO 4 hours prior to procedure  Cancellation fee of $75 if not done with 24 hour notice   .Ozella Almond, CMA

## 2020-05-04 DIAGNOSIS — K1379 Other lesions of oral mucosa: Secondary | ICD-10-CM | POA: Insufficient documentation

## 2020-05-04 NOTE — Assessment & Plan Note (Signed)
Patient presented for new mass in the left buccal surface present for 2 weeks.  History is significant for parotid gland cancer and radiation on that same side and patient continues to be 1 pack/day smoker.  These increase her risk factors for recurrent cancer. Despite having a normal follow-up CT 9/21 will order repeat CT at this time to evaluate. When I described the mass is feeling like a lymph node to the patient, she expressed that this is what they said the first time she was diagnosed with cancer.

## 2020-05-07 ENCOUNTER — Encounter: Payer: Self-pay | Admitting: Family Medicine

## 2020-05-18 ENCOUNTER — Ambulatory Visit
Admission: RE | Admit: 2020-05-18 | Discharge: 2020-05-18 | Disposition: A | Discharge status: Home / Self Care | Payer: 59 | Source: Ambulatory Visit | Attending: Family Medicine | Admitting: Family Medicine

## 2020-05-18 DIAGNOSIS — R220 Localized swelling, mass and lump, head: Secondary | ICD-10-CM

## 2020-05-18 MED ORDER — IOPAMIDOL (ISOVUE-300) INJECTION 61%
75.0000 mL | Freq: Once | INTRAVENOUS | Status: AC | PRN
  Administered 2020-05-18: 75 mL via INTRAVENOUS

## 2020-05-19 ENCOUNTER — Telehealth: Payer: Self-pay | Admitting: Family Medicine

## 2020-05-19 NOTE — Telephone Encounter (Signed)
Called with CT results--palpable area appears to be scar.  Dorris Singh, MD  Family Medicine Teaching Service

## 2020-06-02 ENCOUNTER — Other Ambulatory Visit: Payer: Self-pay

## 2020-06-02 ENCOUNTER — Ambulatory Visit
Admission: RE | Admit: 2020-06-02 | Discharge: 2020-06-02 | Disposition: A | Discharge status: Home / Self Care | Payer: 59 | Source: Ambulatory Visit | Attending: Radiation Oncology | Admitting: Radiation Oncology

## 2020-06-02 VITALS — BP 146/83 | HR 93 | Temp 97.1°F | Resp 18 | Ht 64.0 in | Wt 202.4 lb

## 2020-06-02 DIAGNOSIS — F17210 Nicotine dependence, cigarettes, uncomplicated: Secondary | ICD-10-CM | POA: Insufficient documentation

## 2020-06-02 DIAGNOSIS — Z79899 Other long term (current) drug therapy: Secondary | ICD-10-CM | POA: Diagnosis not present

## 2020-06-02 DIAGNOSIS — Z923 Personal history of irradiation: Secondary | ICD-10-CM | POA: Insufficient documentation

## 2020-06-02 DIAGNOSIS — R682 Dry mouth, unspecified: Secondary | ICD-10-CM | POA: Insufficient documentation

## 2020-06-02 DIAGNOSIS — Z85818 Personal history of malignant neoplasm of other sites of lip, oral cavity, and pharynx: Secondary | ICD-10-CM | POA: Diagnosis not present

## 2020-06-02 DIAGNOSIS — R42 Dizziness and giddiness: Secondary | ICD-10-CM | POA: Insufficient documentation

## 2020-06-02 DIAGNOSIS — C07 Malignant neoplasm of parotid gland: Secondary | ICD-10-CM

## 2020-06-02 NOTE — Progress Notes (Signed)
Shelley [facility] presents for follow up of radiation completed 11/06/2017 to her left parotid bed  Pain issues, if any: Patient denies any neck or throat pain; continues to deal pain to the left knee Using a feeding tube?: N/A Weight changes, if any: States she doesn't have much appetite or interest in eating Wt Readings from Last 3 Encounters:  06/02/20 202 lb 6 oz (91.8 kg)  05/03/20 202 lb 12.8 oz (92 kg)  02/28/20 190 lb (86.2 kg)   Swallowing issues, if any: Patient reports she can eat a wide variety of food, but  she does need to ensure she has liquids with her to ensure food goes down fully. Smoking or chewing tobacco? Continues to smoke cigarettes Using fluoride trays daily? N/A--still waiting to get established with a community dentist Last ENT visit was on: Still trying to find a provider that is in network Other notable issues, if any: Denies any ear pain, but does report jaw discomfort on the left side of her jaw when she tries to open her mouth fully. Denies any issues with swelling or lymphedema to her jaw/neck. Continues to deal with dry mouth and occasional thick saliva. Struggles with anxiety as well as falling asleep/stayinga sleep, and fatigue during the day. Continues to struggle with vertigo. PCP ordered CT neck w/ contrast on 05/18/2020: "IMPRESSION: Stable post treatment neck. No evidence of new or recurrent disease."  Vitals:   06/02/20 1404  BP: (!) 146/83  Pulse: 93  Resp: 18  Temp: (!) 97.1 F (36.2 C)  SpO2: 100%

## 2020-06-06 ENCOUNTER — Encounter: Payer: Self-pay | Admitting: *Deleted

## 2020-06-06 NOTE — Progress Notes (Signed)
[place] Work  Clinical Social Work received referral from radiation oncology.  Head and Neck navigator has made multiple attempts to connect patient with dental resources in the community, and is now requesting CSW support.  CSW contacted patient to discuss possible resources.  CSW and patient discussed resources through [place]; including the orange card.  Patient requested CSW complete referral   CSW contacted CSW at [facility] Medicine to request assistance with orange card application and any additional dental care resources.  CSW will continue to support as needed.    Johnnye Lana, MSW, LCSW, OSW-C Clinical Social Worker [facility] [phone]

## 2020-06-07 ENCOUNTER — Encounter: Payer: Self-pay | Admitting: Radiation Oncology

## 2020-06-07 NOTE — Progress Notes (Signed)
Radiation Oncology         [phone] ________________________________  Name: Shelley Mason MRN: [id number]  Date: 06/02/2020  DOB: 11-29-1965  Follow-Up outpatient note  CC: Shelley Malay, MD  Shelley Gala, MD  Diagnosis and Prior Radiotherapy:       ICD-10-CM   1. 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  Cancer Staging Malignant neoplasm of parotid gland [facility]) Staging form: Major Salivary Glands, AJCC 8th Edition - Clinical: No stage assigned - Unsigned - Pathologic: Stage II (pT2, pN0, cM0) - Signed by Eppie Gibson, MD on 09/17/2017   Radiation treatment dates:   09/22/2017 to 11/06/2017 Site/dose:   The Left parotid bed and distal facial nerve pathway were treated to 64 Gy in 32 fractions of 2 Gy.  CHIEF COMPLAINT:  Here for follow-up and surveillance of left parotid cancer  Narrative:     [name] presents for follow up of radiation completed 11/06/2017 to her left parotid bed  Pain issues, if any: Patient denies any neck or throat pain; continues to deal pain to the left knee Using a feeding tube?: N/A Weight changes, if any: States she doesn't have much appetite or interest in eating Wt Readings from Last 3 Encounters:  06/02/20 202 lb 7 oz (91.8 kg)  05/03/20 202 lb 12.8 oz (92 kg)  02/28/20 190 lb (86.2 kg)   Swallowing issues, if any: Patient reports she can eat a wide variety of food, but  she does need to ensure she has liquids with her to ensure food goes down fully. Smoking or chewing tobacco? Continues to smoke cigarettes Using fluoride trays daily? N/A--still waiting to get established with a community dentist Last ENT visit was on: Still trying to find a provider that is in network Other notable issues, if any: Denies any ear pain, but does report jaw discomfort on the left side of her jaw when  she tries to open her mouth fully. Denies any issues with swelling or lymphedema to her jaw/neck. Continues to deal with dry mouth and occasional thick saliva. Struggles with anxiety as well as falling asleep/stayinga sleep, and fatigue during the day. Continues to struggle with vertigo. PCP ordered CT neck w/ contrast on 05/18/2020: "IMPRESSION: Stable post treatment neck. No evidence of new or recurrent disease."  Vertigo is partially helped by meclizine.  Vitals:   06/02/20 1404 06/02/20 1405  BP: (!) 146/83 (!) 146/83  Pulse: 93 93  Resp: 18 18  Temp: (!) 97.1 F (36.2 C) (!) 97.1 F (36.2 C)  SpO2: 100% 100%    ALLERGIES:  is allergic to penicillins and codeine.  Meds: Current Outpatient Medications  Medication Sig Dispense Refill  . buPROPion (WELLBUTRIN SR) 150 MG 12 hr tablet Take 1 tablet (150 mg total) by mouth 2 (two) times daily. 90 tablet 3  . diclofenac Sodium (VOLTAREN) 1 % GEL Apply four times daily to kene 150 g 1  . FLUoxetine (PROZAC) 20 MG capsule Take 20 mg by mouth daily.    . meclizine (ANTIVERT) 25 MG tablet Take 1 tablet (25 mg total) by mouth daily as needed for dizziness. 30 tablet 0  . nicotine (NICODERM CQ) 21 mg/24hr patch Place 1 patch (21 mg total) onto the skin daily. 14 patch 0  . omeprazole (PRILOSEC) 40 MG capsule Take 1 capsule (40 mg total) by mouth daily. 90 capsule 3  .  sodium fluoride (PREVIDENT 5000 PLUS) 1.1 % CREA dental cream Apply cream to tooth brush. Brush teeth for 2 minutes. Spit out excess. DO NOT rinse afterwards. Repeat nightly. 51 g 3  . sucralfate (CARAFATE) 1 g tablet Take 1 tablet (1 g total) by mouth 2 (two) times daily as needed. 60 tablet 3   No current facility-administered medications for this encounter.    Physical Findings: Wt Readings from Last 3 Encounters:  06/02/20 202 lb 7 oz (91.8 kg)  05/03/20 202 lb 12.8 oz (92 kg)  02/28/20 190 lb (86.2 kg)    height is 5\' 4"  (1.626 m) and weight is 202 lb 7 oz (91.8 kg).  Her temporal temperature is 97.1 F (36.2 C) (abnormal). Her blood pressure is 146/83 (abnormal) and her pulse is 93. Her respiration is 18 and oxygen saturation is 100%.    GEN: alert, oriented, NAD  HEENT: dentition in poor condition. No thrush or tumor in mouth.  b/l tympanic membranes are grossly WNL Neck: no adenopathy in periauricular , cervical, or SCV regions  Lab Findings: Lab Results  Component Value Date   WBC 10.9 (H) 07/27/2019   HGB 12.8 07/27/2019   HCT 39.9 07/27/2019   MCV 94.3 07/27/2019   PLT 270 07/27/2019   CMP     Component Value Date/Time   NA 141 07/27/2019 1925   NA 144 04/23/2019 1427   K 3.5 07/27/2019 1925   CL 106 07/27/2019 1925   CO2 28 07/27/2019 1925   GLUCOSE 96 07/27/2019 1925   BUN 12 07/27/2019 1925   BUN 9 04/23/2019 1427   CREATININE 0.95 07/27/2019 1925   CALCIUM 9.1 07/27/2019 1925   PROT 7.2 04/23/2019 1427   ALBUMIN 4.1 04/23/2019 1427   AST 14 04/23/2019 1427   ALT 10 04/23/2019 1427   ALKPHOS 84 04/23/2019 1427   BILITOT <0.2 04/23/2019 1427   GFRNONAA >60 07/27/2019 1925   GFRAA >60 07/27/2019 1925    Lab Results  Component Value Date   TSH 1.510 08/16/2019    Radiographic Findings: CT Soft Tissue Neck W Contrast  Result Date: 05/19/2020 CLINICAL DATA:  Parotid region mass with swelling for 3 weeks. No palpable mass for marker history of salivary cancer of the left parotid with surgery and radiation. EXAM: CT NECK WITH CONTRAST TECHNIQUE: Multidetector CT imaging of the neck was performed using the standard protocol following the bolus administration of intravenous contrast. 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 Salivary glands: Left parotidectomy with overlying non masslike soft tissue density attributed to scarring. Post  treatment atrophy of the left submandibular gland. No acute inflammation or mass is seen. Thyroid: Normal. Lymph nodes: None enlarged or abnormal density. Vascular: Negative Limited intracranial: Negative Visualized orbits: Minimal coverage is negative Mastoids and visualized paranasal sinuses: Retention cyst in the right maxillary sinus. Dental caries. Skeleton: No acute or aggressive finding. Upper chest: Negative apical lungs. IMPRESSION: Stable post treatment neck. No evidence of new or recurrent disease. Electronically Signed   By: Monte Fantasia M.D.   On: 05/19/2020 07:29    Impression/Plan:    1) Head and Neck Cancer Status: She remains without evidence of disease.  She is struggling with getting multidisciplinary medical and dental care due to insurance issues.  Her disability approval is still pending and she hopes that she will receive Medicaid coverage  when this is approved.   2) Nutritional Status: Stable  PEG tube: No  3) Risk Factors: The patient has been educated about risk factors including alcohol and tobacco abuse; they understand that avoidance of alcohol and tobacco is important to prevent recurrences as well as other cancers.  She continues to smoke.  4) Swallowing: Functional.  5) Thyroid function: WNL. Unlikely to be affected by ipsilateral RT, re-check only if symptoms warrant.  Lab Results  Component Value Date   TSH 1.510 08/16/2019   6) Broken teeth, dental pain in setting of dry mouth: We have made multiple attempts to establish dental care with [facility] dentistry but this has unfortunately been unsuccessful.  She is hopeful that more options will open up if and when she receives Medicaid coverage, pending disability approval  7) Rad/onc scheduling: f/u in 62mo, sooner PRN. I provided emotional support and encouragement today and told her to let us know if needs arise until then; she expressed deep gratitude for our care.  On date of service, in total, I spent 25  minutes on this encounter. Patient was seen in person.  _____________________________________   Eppie Gibson, MD

## 2020-06-08 NOTE — Progress Notes (Signed)
Can you place a new referral for SDOH? They have to be able to count the referral and the old referral for SDOH was closed   Thank you   [place], [place], [place] [address] Direct Dial: [email or web address] Website: [email or web address]

## 2020-06-08 NOTE — Progress Notes (Signed)
Neoma Laming I do not do SDOH calls I may can send to [employer] care guides to [employer]

## 2020-06-09 ENCOUNTER — Ambulatory Visit: Payer: Self-pay | Admitting: Licensed Clinical Social Worker

## 2020-06-09 DIAGNOSIS — K047 Periapical abscess without sinus: Secondary | ICD-10-CM

## 2020-06-09 NOTE — Patient Instructions (Signed)
  Referral placed to Care Guide for information and resources.  [name] received Care Coordination services today:  1. Care Coordination services include personalized support from designated clinical staff supervised by her physician, including individualized plan of care and coordination with other care providers 2. 24/7 contact [phone] for assistance for urgent and routine care needs. 3. Care Coordination are voluntary services and be declined at any time by calling the office.  Shelley Mason, [place]

## 2020-06-09 NOTE — Chronic Care Management (AMB) (Signed)
  Care Management  Collaboration  Note  06/09/2020 Name: Shelley Mason MRN: [id number] DOB: [birth year]  Shelley Mason is a 55 y.o. year old female who is a primary care patient of Martyn Malay, MD. The CCM team was consulted reference care coordination needs.  Assessment: Per Cancer social worker patient needs assistance with dental resources. Intervention: Patient was not interviewed or contacted during this encounter.  CCM LCSW collaborated with [employer] .  Conducted brief assessment, recommendations and relevant information discussed.  Follow up Plan: Referral placed to [place] to provide resources.   Collaboration with Martyn Malay, MD regarding development and update of comprehensive plan of care as evidenced by provider attestation and co-signature Review of patient past medical history, allergies, medications, and health status, including review of pertinent consultant reports was performed as part of comprehensive evaluation and provision of care management/care coordination services.     Casimer Lanius, [place] / [place]   [phone] 11:29 AM

## 2020-06-12 ENCOUNTER — Encounter: Payer: Self-pay | Admitting: General Practice

## 2020-06-12 NOTE — Progress Notes (Signed)
[facility] Spiritual Care Note  Referred by Dr Pearlie Oyster team as an additional layer of support. Reached Shelley Mason by phone, introducing Spiritual Care as part of her support team. She was delighted to take my number and plans to reach out as needed/desired. Also consulted with [name]/LCSW at her request regarding orange card follow-up.   [place], [place], [facility] Pager [phone] Voicemail [phone]

## 2020-06-13 ENCOUNTER — Telehealth: Payer: Self-pay | Admitting: Family Medicine

## 2020-06-13 NOTE — Telephone Encounter (Signed)
   Telephone encounter was:  Successful.  06/13/2020 Name: Shelley Mason MRN: [id number] DOB: [birth year]  Shelley Mason is a 55 y.o. year old female who is a primary care patient of Owens Shark, Raiford Simmonds, MD . The community resource team was consulted for assistance with Financial Difficulties related to dental care, insurance and disability   Care guide performed the following interventions: Patient provided with information about care guide support team and interviewed to confirm resource needs Discussed resources to assist with dental care sent pt a list of dental resources in the area, pt stated that she thought that she was going to get the orange card and then get dental help through [place]. I sent her the application for the orange card in the email. I also sent her an application for Medicaid to try and get that again. Currently pt cannot eat that much but she does have food stamps. She went in front of a judge for her diability claims and had to have an examination; she is waiting on the results of that exam now. Since she is a caner survivor I sent her an application for the [place] cancer fund program. Patient got the email and has no further resource needs at this time. .  Follow Up Plan:  No further follow up planned at this time. The patient has been provided with needed resources.  [place], Care Management Phone: [phone] Email: [email or web address]

## 2020-06-20 ENCOUNTER — Other Ambulatory Visit: Payer: Self-pay

## 2020-06-20 MED ORDER — BUPROPION HCL ER (SR) 150 MG PO TB12
150.0000 mg | ORAL_TABLET | Freq: Two times a day (BID) | ORAL | 3 refills | Status: DC
Start: 2020-06-20 — End: 2021-03-06

## 2020-07-11 ENCOUNTER — Other Ambulatory Visit: Payer: Self-pay | Admitting: Family Medicine

## 2020-07-11 DIAGNOSIS — Z1231 Encounter for screening mammogram for malignant neoplasm of breast: Secondary | ICD-10-CM

## 2020-09-07 ENCOUNTER — Other Ambulatory Visit: Payer: Self-pay

## 2020-09-07 ENCOUNTER — Ambulatory Visit
Admission: RE | Admit: 2020-09-07 | Discharge: 2020-09-07 | Disposition: A | Discharge status: Home / Self Care | Payer: 59 | Source: Ambulatory Visit | Attending: Family Medicine | Admitting: Family Medicine

## 2020-09-07 DIAGNOSIS — Z1231 Encounter for screening mammogram for malignant neoplasm of breast: Secondary | ICD-10-CM

## 2020-09-11 ENCOUNTER — Encounter: Payer: Self-pay | Admitting: Family Medicine

## 2020-09-19 NOTE — Patient Instructions (Addendum)
Thank you for coming to see me today. It was a pleasure.   Reordered Meclizine 25 mg at night  I would encourage you to make an appointment with Dr Valentina Lucks to talk about smoking cessation.  Recommend that you take to your pharmacist about the Shingles vaccine.  Please follow-up with PCP as needed  If you have any questions or concerns, please do not hesitate to call the office at [phone].  Best,   Carollee Leitz, MD    [email or web address]   [facility]  [phone] [email or web address] [place] and [facility]  [address] [place], [place] Crisis [phone]  [facility]) can see uninsured folks for outpatient therapy [email or web address] [address] [place], [place] [address] [phone]  [place] Mon-Fri, 8am-3pm [email or web address], [place], [place]   [place] [phone]- [place] [facility] for psych med management, there may be a wait- if [facility] is working with clients for OPT, they will coordinate with [place] for [place]   Walk-in-Clinic: Monday- Friday 9:00 AM - 4:00 PM [place], [place] [phone]  [facility] the [place] ([employer]) walk in M-F 8am-12pm and  1pm-3pm [place]- [place]  [place]  Phone: [phone]  [facility] ([place] and substance challenges) [address], [place] [phone]    [email or web address], [place]     Phone:  [phone] [place]  [place]  [place]  [phone]  [email or web address].gl   [facility] ( virtual or zoom therapy) strongminds@[employer] [email or web address]  [place]  [place]    [facility] [phone]  grief counseling, dementia and caregiver support    Alcohol & Drug Services Walk-in MWF 12:30 to 3:00     [place] [address]  [phone]  www.ADSyes.org call to schedule an appointment     National Alliance on Mental Illness (NAMI) [place]- Wellness classes, Support groups        [address] [place], [place] [address] [phone]   CurrentJokes.cz   [facility]  (Psycho-social Rehabilitation clubhouse, Individual and group therapy) [address], [place] [address]   [phone]  24- Hour Availability:  *[place] or [phone] * Family Service of the [employer] (Domestic Violence, Rape, etc. )[phone] [place] [phone] or [phone] * [place] [phone] only) [phone] (after hours) *[facility] Mobile Crisis Unit [phone] *[place] National Suicide Hotline 865-735-7663 Diamantina Monks)

## 2020-09-19 NOTE — Progress Notes (Signed)
    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  Has not worsened but interferes with daily living.  Has recently started working again at a daycare and finds this difficult with the dizziness.  PERTINENT  PMH / PSH:  Parotid gland malignancy s/p resection/ radiation Chronic vertigo Tobacco use  OBJECTIVE:   BP 112/64   Pulse 71   Ht 5\' 4"  (1.626 m)   Wt 195 lb 12.8 oz (88.8 kg)   SpO2 99%   BMI 33.61 kg/m    General: Alert, no acute distress Cardio: Normal S1 and S2, RRR, no r/m/g Pulm: CTAB, normal work of breathing Abdomen: Bowel sounds normal. Abdomen soft and non-tender.  Extremities: No peripheral edema.  Neuro: Cranial nerves grossly intact   ASSESSMENT/PLAN:   Vestibular disorder No new symptoms Refill meclizine 25 mg qhs x 30 tabs Follow up with PCP as needed  Cigarette smoker Discussed smoking cessation and making appointment with dr Valentina Lucks to help with smoking cessation.  She reports that she has not been successful with Wellbutrin. She plans to make appointment to discuss options Offered CT chest for screening, she would like to think about this and will let PCP know Follow up with PCP     Carollee Leitz, MD [place]

## 2020-09-20 ENCOUNTER — Ambulatory Visit: Payer: 59 | Admitting: Family Medicine

## 2020-09-20 ENCOUNTER — Other Ambulatory Visit: Payer: Self-pay

## 2020-09-20 VITALS — BP 112/64 | HR 71 | Ht 64.0 in | Wt 195.8 lb

## 2020-09-20 DIAGNOSIS — H8192 Unspecified disorder of vestibular function, left ear: Secondary | ICD-10-CM | POA: Diagnosis not present

## 2020-09-20 DIAGNOSIS — F17210 Nicotine dependence, cigarettes, uncomplicated: Secondary | ICD-10-CM | POA: Diagnosis not present

## 2020-09-20 MED ORDER — MECLIZINE HCL 25 MG PO TABS: 25.0000 mg | ORAL_TABLET | Freq: Every day | ORAL | 0 refills | Status: DC | PRN

## 2020-09-22 ENCOUNTER — Encounter: Payer: Self-pay | Admitting: Family Medicine

## 2020-09-22 NOTE — Assessment & Plan Note (Signed)
No new symptoms Refill meclizine 25 mg qhs x 30 tabs Follow up with PCP as needed

## 2020-09-22 NOTE — Assessment & Plan Note (Signed)
Discussed smoking cessation and making appointment with dr Valentina Lucks to help with smoking cessation.  She reports that she has not been successful with Wellbutrin. She plans to make appointment to discuss options Offered CT chest for screening, she would like to think about this and will let PCP know Follow up with PCP

## 2020-10-13 ENCOUNTER — Ambulatory Visit: Payer: 59 | Admitting: Family Medicine

## 2020-11-03 ENCOUNTER — Ambulatory Visit: Payer: 59 | Admitting: Family Medicine

## 2020-11-03 ENCOUNTER — Other Ambulatory Visit: Payer: Self-pay

## 2020-11-03 ENCOUNTER — Encounter: Payer: Self-pay | Admitting: Family Medicine

## 2020-11-03 VITALS — BP 139/75 | HR 78 | Ht 65.5 in | Wt 199.0 lb

## 2020-11-03 DIAGNOSIS — Z720 Tobacco use: Secondary | ICD-10-CM

## 2020-11-03 DIAGNOSIS — F3341 Major depressive disorder, recurrent, in partial remission: Secondary | ICD-10-CM

## 2020-11-03 DIAGNOSIS — H8192 Unspecified disorder of vestibular function, left ear: Secondary | ICD-10-CM | POA: Diagnosis not present

## 2020-11-03 DIAGNOSIS — Z0000 Encounter for general adult medical examination without abnormal findings: Secondary | ICD-10-CM | POA: Diagnosis not present

## 2020-11-03 DIAGNOSIS — F17210 Nicotine dependence, cigarettes, uncomplicated: Secondary | ICD-10-CM

## 2020-11-03 MED ORDER — NICOTINE POLACRILEX 4 MG MT GUM: 4.0000 mg | CHEWING_GUM | OROMUCOSAL | 0 refills | Status: DC | PRN

## 2020-11-03 NOTE — Progress Notes (Signed)
SUBJECTIVE:   CHIEF COMPLAINT: Follow-up medications and discuss home stressors HPI:   Shelley Mason is a 55 y.o. yo with history notable for parotid gland tumor status postresection with persistent vertigo, tobacco use and mood disorder presenting for routine follow-up.  Patient reports overall her mood is low.  She reports that her home situation is stressful.  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  She likes some of the people that she works with but not all of them.  She denies thoughts of hurting herself or others.  She finds support in some friends and family members.  Home is particularly stressful as her long-term partner has been hospitalized for suspected drug use since she feels he is distracting.  This significantly disrupts her sleep.  She is taking her Prozac and bupropion as prescribed.  To cope with this currently she is smoking more.  The patient is smoked since she is about 18 and smokes between 1/2 pack to 1 pack a day.  She feels Wellbutrin really cut down her cravings but finds that when she feels stressed particularly at home she smokes quite a bit.  She is interested in trying nicotine gum and seeing the pharmacy team to discuss other options for quitting.  PERTINENT  PMH / PSH/Family/Social History : Updated and reviewed as appropriate  OBJECTIVE:   BP 139/75   Pulse 78   Ht 5' 5.5" (1.664 m)   Wt 199 lb (90.3 kg)   SpO2 96%   BMI 32.61 kg/m   Today's weight:  Last Weight  Most recent update: 11/03/2020 11:09 AM    Weight  90.3 kg (199 lb)            Review of prior weights: [employer]   11/03/20 1109  Weight: 199 lb (90.3 kg)     Cardiac: Regular rate and rhythm. Normal S1/S2. No murmurs, rubs, or gallops appreciated. Lungs: Clear bilaterally to ascultation.  HEENT exam is notable for deformity to the left ear and postsurgical changes over the left parotid and left neck.  There is no cervical  lymphadenopathy.  She does have some chronic scarring and retraction of the left tympanic membrane.  The right paracranium is normal-appearing.  No oral lesions identified.  She does have poor dentition.  ASSESSMENT/PLAN:   Cigarette smoker Resources for quitting were given.  She is scheduled with our pharmacy team to discuss tobacco cessation at length.  Nicorette gum prescribed and we discussed at length how to use this including the chew and park method.  Depression We will continue current medications.  She does feel these help her mood and her sleep.  She she is very interested in therapy but feels every time she tries to establish care with one that either do not take her insurance or many months away.  Referral to chronic care management to aid in referral process.  Also referral to social determinants of health through [facility] for help with housing resources given patient's limited funds and home stressors.  We appreciate their consultation and support with this patient   Healthcare maintenance The patient declined routine vaccines today she is to go back to work She is due for COVID, flu and pneumonia She has an upcoming dental and eye exam which I congratulated her for scheduling Lipids at follow up     Dorris Singh, [place]

## 2020-11-03 NOTE — Assessment & Plan Note (Signed)
Resources for quitting were given.  She is scheduled with our pharmacy team to discuss tobacco cessation at length.  Nicorette gum prescribed and we discussed at length how to use this including the chew and park method.

## 2020-11-03 NOTE — Patient Instructions (Signed)
It was wonderful to see you today.  Please bring ALL of your medications with you to every visit.   Today we talked about:  Going to the lab to check cholesterol  Make a follow up in 6 weeks  I will call you with blood work    Thank you for choosing [facility].   Please call [phone] with any questions about today's appointment.  Please be sure to schedule follow up at the front  desk before you leave today.   Dorris Singh, MD  Family Medicine

## 2020-11-03 NOTE — Assessment & Plan Note (Signed)
We will continue current medications.  She does feel these help her mood and her sleep.  She she is very interested in therapy but feels every time she tries to establish care with one that either do not take her insurance or many months away.  Referral to chronic care management to aid in referral process.  Also referral to social determinants of health through [facility] for help with housing resources given patient's limited funds and home stressors.  We appreciate their consultation and support with this patient

## 2020-11-07 ENCOUNTER — Telehealth: Payer: Self-pay | Admitting: *Deleted

## 2020-11-07 NOTE — Chronic Care Management (AMB) (Signed)
  Care Management   Note  11/07/2020 Name: Shelley Mason MRN: [id number] DOB: 04/16/1965  Shelley Mason is a 55 y.o. year old female who is a primary care patient of Martyn Malay, MD. I reached out to [place] by phone today in response to a referral sent by Shelley Mason's PCP, Dr. Owens Shark.    Shelley Mason was given information about care management services today including:  Care management services include personalized support from designated clinical staff supervised by her physician, including individualized plan of care and coordination with other care providers 24/7 contact phone numbers for assistance for urgent and routine care needs. The patient may stop care management services at any time by phone call to the office staff.  Patient agreed to services and verbal consent obtained.   Follow up plan: Telephone appointment with care management team member scheduled for:11/14/20  [place] Management  Direct Dial: [phone]

## 2020-11-09 ENCOUNTER — Ambulatory Visit: Payer: 59 | Admitting: Pharmacist

## 2020-11-10 ENCOUNTER — Telehealth: Payer: Self-pay | Admitting: Student-PharmD

## 2020-11-10 NOTE — Telephone Encounter (Signed)
Called patient to reschedule missed appointment from yesterday with pharmacy clinic to discuss smoking cessation. She stated that she doesn't get off work until about 4:30pm and it would be best if instead of coming in, we could call her around 1pm. She has not yet picked up the nicotine gum that Dr. Owens Shark prescribed. Made plan for me to call her next Thursday 9/15 at 1pm. She confirmed understanding and thanked me for the call.

## 2020-11-14 ENCOUNTER — Ambulatory Visit: Payer: 59 | Admitting: Licensed Clinical Social Worker

## 2020-11-14 NOTE — Chronic Care Management (AMB) (Signed)
    Clinical Social Work  Care Management   Phone Outreach    11/14/2020 Name: Shelley Mason MRN: [id number] DOB: 09-16-1965  Shelley Mason is a 55 y.o. year old female who is a primary care patient of Martyn Malay, MD .   Reason for referral: Mental Health Counseling and Resources.    CCM LCSW reached out to patient today by phone to introduce self, assess needs and offer Care Management services and interventions.    Unable to keep phone appointment today and requested to reschedule.  Plan:Appointment was rescheduled with CCM 11/16/2020  Review of patient status, including review of consultants reports, relevant laboratory and other test results, and collaboration with appropriate care team members and the patient's provider was performed as part of comprehensive patient evaluation and provision of care management services.    Casimer Lanius, [place] / [place]   [phone] 3:09 PM

## 2020-11-14 NOTE — Patient Instructions (Signed)
   It was a pleasure speaking with you today. per your request your appointment is scheduled 11/16/2020  Casimer Lanius, LCSW Care Management & Coordination  [phone]

## 2020-11-16 ENCOUNTER — Telehealth: Payer: Self-pay | Admitting: Student-PharmD

## 2020-11-16 ENCOUNTER — Ambulatory Visit: Payer: 59 | Admitting: Licensed Clinical Social Worker

## 2020-11-16 DIAGNOSIS — Z7189 Other specified counseling: Secondary | ICD-10-CM

## 2020-11-16 DIAGNOSIS — Z139 Encounter for screening, unspecified: Secondary | ICD-10-CM

## 2020-11-16 NOTE — Telephone Encounter (Signed)
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 However, today she said this time would not work and would need to call back at 11am when she goes to lunch. Will try her again tomorrow at 11am. As of conversation last week, she had not yet picked up the nicotine gum Dr. Owens Shark prescribed.

## 2020-11-16 NOTE — Patient Instructions (Signed)
Licensed Clinical Social Worker Visit Information  Goals we discussed today:   Goals Addressed             This Visit's Progress    Coping Skills Enhanced ( connect with counseling and community support       Patient Goals/Self-Care Activities: Over the next 30 days Call your insurance provider for more information about your Enhanced Benefits  Follow up with [facility] [place] for [employer] options  Wednesday 3pm-7pm, Friday 11am-2pm, and Saturday 9am-12pm  You can go in and shop for what you need Continue with compliance of taking medication  I have place a referral to the care guide for community resources, they will follow up with you  I have places a referral  with [place] for [employer]  I have placed a referral with [facility] to assist with connecting you with a mental health provider. They informed me today that they have matched you with a provider and send you an e-mail.  Please check your e-mail. Review your EMMI educational information on breathing to relax and relieving stress       Ms. Millay was given information about Care Management services today including:  Care Management services include personalized support from designated clinical staff supervised by her physician, including individualized plan of care and coordination with other care providers 24/7 contact phone numbers for assistance for urgent and routine care needs. The patient may stop Care Management services at any time by phone call to the office staff.  Patient agreed to services and verbal consent obtained.   Patient verbalizes understanding of instructions provided today and agrees to view in [place].   Follow up plan: Appointment scheduled for SW follow up with client by phone on: 12/05/2020  Casimer Lanius, [place] / [place]   [phone]

## 2020-11-16 NOTE — Chronic Care Management (AMB) (Signed)
Care Management Clinical Social Work Note  11/16/2020 Name: Shelley Mason MRN: [id number] DOB: 04/27/1965  Shelley Mason is a 55 y.o. year old female who is a primary care patient of Martyn Malay, MD.  The Care Management team was consulted for assistance with chronic disease management and coordination needs. Mental Health Counseling and Resources  Engaged with patient by telephone for initial visit in response to provider referral for social work chronic care management and care coordination services  Consent to Services:  Shelley Mason was given information about Care Management services today including:  Care Management services includes personalized support from designated clinical staff supervised by her physician, including individualized plan of care and coordination with other care providers 24/7 contact phone numbers for assistance for urgent and routine care needs. The patient may stop case management services at any time by phone call to the office staff.  Patient agreed to services and consent obtained.   Assessment: .   Patient is currently experiencing symptoms of  anxiety and depression which seems to be exacerbated by life transitions, death of family members, own health and balancing work. She has spoken with her insurance provider to assist with locating a therapist but a of today not this has not happen.. See Care Plan below for interventions and patient self-care actives.  Recommendation: Patient may benefit from, and is in agreement to allow LCSW to make referral via [place]. ( Update from [place] they have matched patient with a provider)  Follow up Plan: Patient would like continued follow-up from CCM LCSW .  per patient's request will follow up in 2 to 3 weeks.  Will call office if needed prior to next encounter.    Review of patient past medical history, allergies, medications, and health status, including review of relevant consultants reports was performed  today as part of a comprehensive evaluation and provision of chronic care management and care coordination services.  SDOH (Social Determinants of Health) assessments and interventions performed:  SDOH Interventions    Flowsheet Row Most Recent Value  SDOH Interventions   SDOH Interventions for the Following Domains Depression  Food Insecurity Interventions Other (Comment)  [One Step further]  Housing Interventions [id number] Referral  Depression Interventions/Treatment  Counseling  [referral]        Advanced Directives Status: Not addressed in this encounter.  Care Plan  Allergies  Allergen Reactions   Penicillins Anaphylaxis and Rash    Has patient had a PCN reaction causing immediate rash, facial/tongue/throat swelling, SOB or lightheadedness with hypotension: Yes Has patient had a PCN reaction causing severe rash involving mucus membranes or skin necrosis: Yes Has patient had a PCN reaction that required hospitalization: Yes Has patient had a PCN reaction occurring within the last 10 years: No If all of the above answers are "NO", then may proceed with Cephalosporin use.    Codeine Rash    Outpatient Encounter Medications as of 11/16/2020  Medication Sig   buPROPion (WELLBUTRIN SR) 150 MG 12 hr tablet Take 1 tablet (150 mg total) by mouth 2 (two) times daily.   diclofenac Sodium (VOLTAREN) 1 % GEL Apply four times daily to kene   FLUoxetine (PROZAC) 20 MG capsule Take 20 mg by mouth daily.   meclizine (ANTIVERT) 25 MG tablet Take 1 tablet (25 mg total) by mouth daily as needed for dizziness.   nicotine polacrilex (NICORETTE) 4 MG gum Take 1 each (4 mg total) by mouth as needed for smoking cessation.   omeprazole (PRILOSEC) 40  MG capsule Take 1 capsule (40 mg total) by mouth daily.   sucralfate (CARAFATE) 1 g tablet Take 1 tablet (1 g total) by mouth 2 (two) times daily as needed.   No facility-administered encounter medications on file as of 11/16/2020.    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   Depression 04/21/2017   Cigarette smoker 06/21/2015    Conditions to be addressed/monitored: Anxiety and Depression; Financial constraints related to difficulty with monthly expenses   Care Plan : General Social Work (Adult)  Updates made by Maurine Cane, LCSW since 11/16/2020 12:00 AM     Problem: Emotional Distress      Goal: Emotional Health Supported by connecting with a therapist   Start Date: 11/16/2020  This Visit's Progress: On track  Priority: High  Note:   Current barriers:   Difficulty finding a provider to take her insurance  Chronic Mental Health needs related to managing symptoms of anxiety and depression  Needs Support, Education, and Care Coordination in order to meet unmet mental health needs. Clinical Goal(s): patient will work with mental health provider to address needs related to managing symptoms   Clinical Interventions:  Assessed patient's previous and current treatment, coping skills, support system and barriers to care  Solution-Focused Strategies Active listening / Reflection utilized  Problem [place]  Reviewed mental health medications with patient and discussed compliance:  Provided EMMI education information on breathing to relax and relieving stress  Suicidal Ideation/Homicidal Ideation assessed: Discussed referral to [facility] to assist with connecting to mental health provider ; (referral placed Update that [facility] has matched patient with a provider today Review various resources, discussed options and provided patient information about  Department of Social Services ( food stamps ) [employer] options  Referral for [employer] (rental assistance) Options for  mental health treatment based on need and insurance Inter-disciplinary care team collaboration (see longitudinal plan of care)  Patient Goals/Self-Care Activities: Over the next 30 days Call your insurance provider for more information about your Enhanced Benefits  Follow up with [facility] [place] for [employer] options  Wednesday 3pm-7pm, Friday 11am-2pm, and Saturday 9am-12pm  You can go in and shop for what you need Continue with compliance of taking medication  I have place a referral to the care guide for community resources, they will follow up with you  I have places a referral  with [place] for [employer]  I have placed a referral with [facility] to assist with connecting you with a mental health provider. they will contact you once a provider is located. Review your EMMI educational information on breathing to relax and relieving stress     Casimer Lanius, [place] / [place]   [phone] 1:33 PM

## 2020-11-27 ENCOUNTER — Telehealth: Payer: Self-pay | Admitting: *Deleted

## 2020-11-27 NOTE — Telephone Encounter (Signed)
   Telephone encounter was:  Unsuccessful.  11/27/2020 Name: Shelley Mason MRN: [id number] DOB: 1965-04-10  Unsuccessful outbound call made today to assist with:  Transportation Needs  and Food Insecurity  Outreach Attempt:  1st Attempt  A HIPAA compliant voice message was left requesting a return call.  Instructed patient to call back at   Instructed patient to call back at [phone]  at their earliest convenience. .  [place], Care Management  [phone] [address] , [place] [address] Email : [email or web address]

## 2020-11-29 ENCOUNTER — Telehealth: Payer: Self-pay | Admitting: *Deleted

## 2020-11-29 NOTE — Telephone Encounter (Signed)
   Telephone encounter was:  Successful.  11/29/2020 Name: Shelley Mason MRN: [id number] DOB: [birth year]  Shelley Mason is a 55 y.o. year old female who is a primary care patient of Owens Shark, Raiford Simmonds, MD . The community resource team was consulted for assistance with Transportation Needs , Food Insecurity, and housing  Care guide performed the following interventions: repeated called patient is busy but said to send information via email which i will and include my contact information incase she has any other needs. .  Follow Up Plan:  No further follow up planned at this time. The patient has been provided with needed resources.  [place], Care Management  [phone] [address] , [place] [address] Email : [email or web address]

## 2020-12-05 ENCOUNTER — Ambulatory Visit: Payer: 59 | Admitting: Licensed Clinical Social Worker

## 2020-12-05 NOTE — Chronic Care Management (AMB) (Signed)
    Clinical Social Work  Care Management   Phone Outreach    12/05/2020 Name: Shelley Mason MRN: [id number] DOB: 1965/03/11  [name] is a 55 y.o. year old female who is a primary care patient of Martyn Malay, MD .   Reason for referral: Mental Health Counseling and Resources.    F/U phone call today to assess needs, progress and barriers with care plan goals.  Patient unable to keep phone appointment and states will call LCSW back.   Interventions: f/u with [facility] for counseling who reports unable to reach patient to schedule therapy appointment.    Plan:CCM LCSW will wait for return call.  If no return call is received will attempt outreach later this week.  Review of patient status, including review of consultants reports, relevant laboratory and other test results, and collaboration with appropriate care team members and the patient's provider was performed as part of comprehensive patient evaluation and provision of care management services.     Casimer Lanius, [place] / [place]   [phone]

## 2020-12-06 ENCOUNTER — Telehealth: Payer: Self-pay | Admitting: *Deleted

## 2020-12-06 NOTE — Telephone Encounter (Signed)
CALLED PATIENT TO ALTER FU DUE TO DR. SQUIRE AND FAMILY BEING SICK, RESCHEDULED FOR 12-22-20 @ 3 PM, LVM FOR A RETURN CALL

## 2020-12-08 ENCOUNTER — Other Ambulatory Visit: Payer: Self-pay

## 2020-12-08 ENCOUNTER — Ambulatory Visit: Payer: Self-pay | Admitting: Radiation Oncology

## 2020-12-08 DIAGNOSIS — Z1329 Encounter for screening for other suspected endocrine disorder: Secondary | ICD-10-CM

## 2020-12-08 DIAGNOSIS — C07 Malignant neoplasm of parotid gland: Secondary | ICD-10-CM

## 2020-12-12 ENCOUNTER — Telehealth: Payer: Self-pay | Admitting: *Deleted

## 2020-12-12 NOTE — Chronic Care Management (AMB) (Signed)
  Care Management   Note  12/12/2020 Name: Shelley Mason MRN: [id number] DOB: 1965-05-24  Shelley Mason is a 55 y.o. year old female who is a primary care patient of Martyn Malay, MD and is actively engaged with the care management team. I reached out to [name] by phone today to assist with re-scheduling a follow up visit with the Licensed Clinical Social Worker  Follow up plan: Unsuccessful telephone outreach attempt made. A HIPAA compliant phone message was left for the patient providing contact information and requesting a return call.  The care management team will reach out to the patient again over the next 7 days.  If patient returns call to provider office, please advise to call [place] at [place] [facility]  Direct Dial: [phone]

## 2020-12-19 NOTE — Chronic Care Management (AMB) (Signed)
  Care Management   Note  12/19/2020 Name: [name] MRN: [id number] DOB: 17-May-1965  Shelley Mason is a 55 y.o. year old female who is a primary care patient of Martyn Malay, MD and is actively engaged with the care management team. I reached out to [place] by phone today to assist with re-scheduling a follow up visit with the Licensed Clinical Social Worker  Follow up plan: A second unsuccessful telephone outreach attempt made. A HIPAA compliant phone message was left for the patient providing contact information and requesting a return call. The care management team will reach out to the patient again over the next 7 days. If patient returns call to provider office, please advise to call [place]  at [phone].  [place] Management  Direct Dial: [phone]

## 2020-12-21 ENCOUNTER — Telehealth: Payer: Self-pay | Admitting: *Deleted

## 2020-12-21 NOTE — Progress Notes (Signed)
Shelley Mason presents for follow up of radiation completed 11/06/2017 to her left parotid bed  Pain issues, if any: none Using a feeding tube?: N/A Weight changes, if any:  Filed Weights   12/22/20 1440  Weight: 200 lb (90.7 kg)  BP 131/78 (BP Location: Right Arm, Patient Position: Sitting, Cuff Size: Normal)   Pulse (!) 103   Temp 97.8 F (36.6 C) (Skin)   Resp 18   Wt 200 lb (90.7 kg)   SpO2 100%   BMI 32.78 kg/m   Swallowing issues, if any: dry mouth always has a drink with her Smoking or chewing tobacco? Yes half a pack a day Using fluoride trays daily? No N/A--still waiting to get established with a community dentist?? Last ENT visit was on: no Still trying to find a provider that is in network?? Other notable issues, if any: patient states her teeth are breaking off. Doesn't want to eat because she can not enjoy her food.

## 2020-12-21 NOTE — Telephone Encounter (Signed)
CALLED PATIENT TO REMIND OF LAB PRIOR TO FU ON 12-22-20 @ 2:30 PM @ [place], LVM FOR A RETURN CALL

## 2020-12-22 ENCOUNTER — Ambulatory Visit
Admission: RE | Admit: 2020-12-22 | Discharge: 2020-12-22 | Disposition: A | Discharge status: Home / Self Care | Payer: 59 | Source: Ambulatory Visit | Attending: Radiation Oncology | Admitting: Radiation Oncology

## 2020-12-22 ENCOUNTER — Encounter: Payer: Self-pay | Admitting: Radiation Oncology

## 2020-12-22 ENCOUNTER — Other Ambulatory Visit: Payer: Self-pay

## 2020-12-22 VITALS — BP 131/78 | HR 103 | Temp 97.8°F | Resp 18 | Wt 200.0 lb

## 2020-12-22 DIAGNOSIS — F17210 Nicotine dependence, cigarettes, uncomplicated: Secondary | ICD-10-CM | POA: Insufficient documentation

## 2020-12-22 DIAGNOSIS — S025XXA Fracture of tooth (traumatic), initial encounter for closed fracture: Secondary | ICD-10-CM | POA: Insufficient documentation

## 2020-12-22 DIAGNOSIS — C07 Malignant neoplasm of parotid gland: Secondary | ICD-10-CM

## 2020-12-22 DIAGNOSIS — Z1329 Encounter for screening for other suspected endocrine disorder: Secondary | ICD-10-CM

## 2020-12-22 DIAGNOSIS — Z923 Personal history of irradiation: Secondary | ICD-10-CM | POA: Diagnosis not present

## 2020-12-22 DIAGNOSIS — Z79899 Other long term (current) drug therapy: Secondary | ICD-10-CM | POA: Insufficient documentation

## 2020-12-22 DIAGNOSIS — Z85818 Personal history of malignant neoplasm of other sites of lip, oral cavity, and pharynx: Secondary | ICD-10-CM | POA: Diagnosis present

## 2020-12-22 LAB — TSH: TSH: 0.771 u[IU]/mL (ref 0.350–4.500)

## 2020-12-25 ENCOUNTER — Encounter: Payer: Self-pay | Admitting: Radiation Oncology

## 2020-12-25 NOTE — Progress Notes (Addendum)
Radiation Oncology         [phone] ________________________________  Name: Shelley Mason MRN: [id number]  Date: 12/22/2020  DOB: 1966/02/26  Follow-Up outpatient note  CC: Martyn Malay, MD  Izora Gala, MD  Diagnosis and Prior Radiotherapy:       ICD-10-CM   1. 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  Cancer Staging Malignant neoplasm of parotid gland [facility]) Staging form: Major Salivary Glands, AJCC 8th Edition - Clinical: No stage assigned - Unsigned - Pathologic: Stage II (pT2, pN0, cM0) - Signed by Eppie Gibson, MD on 09/17/2017   Radiation treatment dates:   09/22/2017 to 11/06/2017 Site/dose:   The Left parotid bed and distal facial nerve pathway were treated to 64 Gy in 32 fractions of 2 Gy.  CHIEF COMPLAINT:  Here for follow-up and surveillance of left parotid cancer  Narrative:      Ms. Boggio presents for follow up of radiation completed 11/06/2017 to her left parotid bed  Pain issues, if any: none Using a feeding tube?: N/A Weight changes, if any:  Filed Weights   12/22/20 1440  Weight: 200 lb (90.7 kg)  BP 131/78 (BP Location: Right Arm, Patient Position: Sitting, Cuff Size: Normal)   Pulse (!) 103   Temp 97.8 F (36.6 C) (Skin)   Resp 18   Wt 200 lb (90.7 kg)   SpO2 100%   BMI 32.78 kg/m   Swallowing issues, if any: dry mouth always has a drink with her Smoking or chewing tobacco? Yes half a pack a day Using fluoride trays daily? No N/A--still waiting to get established with a community dentist and having difficulty finding one; her teeth are breaking off and it is affecting her ability to eat.  She is miserable. Last ENT visit was on: no Still trying to find a provider that is in network and having difficulty with this as well Other notable issues, if any: patient states her teeth  are breaking off. Doesn't want to eat because she can not enjoy her food.     ALLERGIES:  is allergic to penicillins and codeine.  Meds: Current Outpatient Medications  Medication Sig Dispense Refill   buPROPion (WELLBUTRIN SR) 150 MG 12 hr tablet Take 1 tablet (150 mg total) by mouth 2 (two) times daily. 90 tablet 3   FLUoxetine (PROZAC) 20 MG capsule Take 20 mg by mouth daily.     meclizine (ANTIVERT) 25 MG tablet Take 1 tablet (25 mg total) by mouth daily as needed for dizziness. 30 tablet 0   omeprazole (PRILOSEC) 40 MG capsule Take 1 capsule (40 mg total) by mouth daily. 90 capsule 3   diclofenac Sodium (VOLTAREN) 1 % GEL Apply four times daily to [facility] (Patient not taking: Reported on 12/22/2020) 150 g 1   nicotine polacrilex (NICORETTE) 4 MG gum Take 1 each (4 mg total) by mouth as needed for smoking cessation. (Patient not taking: Reported on 12/22/2020) 100 tablet 0   sucralfate (CARAFATE) 1 g tablet Take 1 tablet (1 g total) by mouth 2 (two) times daily as needed. (Patient not taking: Reported on 12/22/2020) 60 tablet 3   No current facility-administered medications for this encounter.    Physical Findings: Wt Readings from Last 3 Encounters:  12/22/20 200 lb (90.7 kg)  11/03/20 199 lb (90.3 kg)  09/20/20 195 lb 12.8 oz (88.8  kg)    weight is 200 lb (90.7 kg). Her skin temperature is 97.8 F (36.6 C). Her blood pressure is 131/78 and her pulse is 103 (abnormal). Her respiration is 18 and oxygen saturation is 100%.    GEN: alert, oriented, NAD  HEENT: dentition in poor condition. No thrush or tumor in mouth.  b/l tympanic membranes are without erythema or drainage.  There is some mild scarring in the left tympanic membrane Neck: no adenopathy in periauricular , cervical, or SCV regions Heart regular in rate and rhythm Chest clear to auscultation bilaterally  Lab Findings: Lab Results  Component Value Date   WBC 10.9 (H) 07/27/2019   HGB 12.8 07/27/2019   HCT 39.9  07/27/2019   MCV 94.3 07/27/2019   PLT 270 07/27/2019   CMP     Component Value Date/Time   NA 141 07/27/2019 1925   NA 144 04/23/2019 1427   K 3.5 07/27/2019 1925   CL 106 07/27/2019 1925   CO2 28 07/27/2019 1925   GLUCOSE 96 07/27/2019 1925   BUN 12 07/27/2019 1925   BUN 9 04/23/2019 1427   CREATININE 0.95 07/27/2019 1925   CALCIUM 9.1 07/27/2019 1925   PROT 7.2 04/23/2019 1427   ALBUMIN 4.1 04/23/2019 1427   AST 14 04/23/2019 1427   ALT 10 04/23/2019 1427   ALKPHOS 84 04/23/2019 1427   BILITOT <0.2 04/23/2019 1427   GFRNONAA >60 07/27/2019 1925   GFRAA >60 07/27/2019 1925    Lab Results  Component Value Date   TSH 0.771 12/22/2020    Radiographic Findings: No results found.   Impression/Plan:    1) Head and Neck Cancer Status: She remains without evidence of disease.   2) Nutritional Status: Stable  PEG tube: No  3) Risk Factors: The patient has been educated about risk factors including alcohol and tobacco abuse; they understand that avoidance of alcohol and tobacco is important to prevent recurrences as well as other cancers.  She continues to smoke.  4) Swallowing: Functional.  5) Thyroid function: WNL. Unlikely to be affected by ipsilateral RT Lab Results  Component Value Date   TSH 0.771 12/22/2020   6) Broken teeth, dental pain in setting of dry mouth, following surgery and radiation for parotid cancer.  Dentistry consultation and dental extractions are medically necessary. We have made multiple attempts to establish dental care with [facility] dentistry but this has unfortunately been unsuccessful.    I have reached out to Dr. Benson Norway here at [facility] to see if the patient can have a consultation with her  7) Rad/onc scheduling: f/u in 90mo, sooner PRN. I provided emotional support and encouragement today and told her to let us know if needs arise until then; she expressed deep gratitude for our care.  On date of service, in total, I spent 25 minutes on  this encounter. Patient was seen in person.  _____________________________________   Eppie Gibson, MD

## 2020-12-26 NOTE — Chronic Care Management (AMB) (Signed)
  Care Management   Note  12/26/2020 Name: SANELA EVOLA MRN: [id number] DOB: 1965/03/23  KARILYN WIND is a 55 y.o. year old female who is a primary care patient of Martyn Malay, MD and is actively engaged with the care management team. I reached out to [name] by phone today to assist with scheduling a follow up visit with the Licensed Clinical Social Worker  Follow up plan: Telephone appointment with care management team member scheduled for:01/01/21  [place] Management  Direct Dial: [phone]

## 2020-12-27 ENCOUNTER — Other Ambulatory Visit: Payer: Self-pay

## 2020-12-27 DIAGNOSIS — Z85818 Personal history of malignant neoplasm of other sites of lip, oral cavity, and pharynx: Secondary | ICD-10-CM

## 2021-01-01 ENCOUNTER — Ambulatory Visit: Payer: 59 | Admitting: Licensed Clinical Social Worker

## 2021-01-01 DIAGNOSIS — F419 Anxiety disorder, unspecified: Secondary | ICD-10-CM

## 2021-01-01 DIAGNOSIS — F3341 Major depressive disorder, recurrent, in partial remission: Secondary | ICD-10-CM

## 2021-01-01 DIAGNOSIS — Z7189 Other specified counseling: Secondary | ICD-10-CM

## 2021-01-01 DIAGNOSIS — F330 Major depressive disorder, recurrent, mild: Secondary | ICD-10-CM

## 2021-01-01 NOTE — Patient Instructions (Signed)
Visit Information   Goals Addressed             This Visit's Progress    Coping Skills Enhanced ( connect with counseling and community support   On track    Patient Goals/Self-Care Activities: Over the next 14 days Follow up with [facility] [place] for [employer] options  Wednesday 3pm-7pm, Friday 11am-2pm, and Saturday 9am-12pm  You can go in and shop for what you need Continue with compliance of taking medication  Look for E-mail from [place]; Review your EMMI educational information on [place] [employer] [employer] and [place] I have placed the referral with [place]  [phone] [email or web address]        It was a pleasure speaking with you today. Please call the office if needed Follow up appointment is scheduled 01/15/21 Patient verbalizes understanding of instructions provided today and agrees to view in [place].  Casimer Lanius, LCSW Care Management & Coordination  [phone]

## 2021-01-01 NOTE — Chronic Care Management (AMB) (Signed)
Care Management  Clinical Social Work Note  01/01/2021 Name: Shelley Mason MRN: [id number] DOB: 03-Mar-1966  Shelley Mason is a 55 y.o. year old female who is a primary care patient of Martyn Malay, MD. The CCM team was consulted for assistance with care coordination needs. Mental Health Counseling and Resources   Consent to Services:  The patient was given information about Care Management services, agreed to services, and gave verbal consent prior to initiation of services.  Please see initial visit note for detailed documentation.   Patient agreed to services today and consent obtained.  Engaged with patient by telephone for follow up visit in response to provider referral for social work care coordination services.   Assessment/Interventions: Assessed patient's current treatment, progress, coping skills, support system and barriers to care.  She continues to experience difficulty with connecting to provider for therapy. She is doing better with managing her stress level but continue to experience symptoms with anxiety and difficult sleeping.  Reports " my mind is not resting" .  See Care Plan below for interventions and patient self-care actives.  Recommendation: Patient may benefit from, and is in agreement to contact PCP with sleep concerns, she will also implement sleep hygiene information shared via EMMI video. Referral placed today to [facility]..   Follow up Plan: Patient would like continued follow-up from CCM LCSW .  per patient's request will follow up in 2 weeks.  Will call office if needed prior to next encounter.   Review of patient past medical history, allergies, medications, and health status, including review of pertinent consultant reports was performed as part of comprehensive evaluation and provision of care management/care coordination services.   SDOH (Social Determinants of Health) screening and interventions performed today:   Advanced  Directives Status:Not addressed in this encounter.     Care Plan    Conditions to be addressed/monitored per PCP order: Anxiety and Depression,   Care Plan : General Social Work (Adult)  Updates made by Maurine Cane, LCSW since 01/01/2021 12:00 AM     Problem: Emotional Distress      Goal: Emotional Health Supported by connecting with a therapist   Start Date: 11/16/2020  This Visit's Progress: On track  Recent Progress: On track  Priority: High  Note:   Current barriers:   Difficulty connecting with [facility] for counseling  Chronic Mental Health needs related to managing symptoms of anxiety and depression  Needs Support, Education, and Care Coordination in order to meet unmet mental health needs. Clinical Goal(s): patient will work with mental health provider to address needs related to managing symptoms   Clinical Interventions:  Assessed patient's previous and current treatment, coping skills, support system and barriers to care  Solution-Focused Strategies employed: discussing therapy options  Active listening / Reflection utilized  Problem [place] strategies reviewed Reviewed mental health medications with patient and discussed importance of compliance:  Quality of sleep assessed & Sleep Hygiene techniques promoted  Provided EMMI education information on [place] NIGHT'S SLEEP and MANAGING ANXIETY AROUND DAILY TASKS Review various resources, discussed options and provided patient information about  Options for mental health treatment based on need and insurance Inter-disciplinary care team collaboration (see longitudinal plan of care) Patient Goals/Self-Care Activities: Over the next 14 days Follow up with [facility] for [employer] options  Wednesday 3pm-7pm, Friday 11am-2pm, and Saturday 9am-12pm  You can go in and shop for what you need Continue with  compliance of taking medication  Look for E-mail from  [place]; Review your EMMI educational information on [place] NIGHT'S SLEEP and MANAGING ANXIETY AROUND DAILY TASKS I have placed the referral with [facility]  [phone] [email or web address]     Casimer Lanius, [place] / [place]   [phone]

## 2021-01-15 ENCOUNTER — Ambulatory Visit: Payer: 59 | Admitting: Licensed Clinical Social Worker

## 2021-01-15 DIAGNOSIS — F330 Major depressive disorder, recurrent, mild: Secondary | ICD-10-CM

## 2021-01-15 DIAGNOSIS — Z7189 Other specified counseling: Secondary | ICD-10-CM

## 2021-01-15 NOTE — Chronic Care Management (AMB) (Signed)
Care Management  Clinical Social Work Note  01/15/2021 Name: Shelley Mason MRN: [id number] DOB: [birth year]  Shelley Mason is a 55 y.o. year old female who is a primary care patient of Martyn Malay, MD. The CCM team was consulted for assistance with care coordination needs.  Connect with mental health provider.    Consent to Services:  The patient was given information about Care Management services, agreed to services, and gave verbal consent prior to initiation of services.  Please see initial visit note for detailed documentation.   Patient agreed to services today and consent obtained.  Engaged with patient by telephone for follow up visit in response to provider referral for social work care coordination services.   Assessment/Interventions: Assessed patient's current treatment, progress, coping skills, support system and barriers to care.  She has been able to complete paper work with [facility] but has not been able to connect to get appointment scheduled . Able to assist patient with getting appointment scheduled.  12/20/22See Care Plan below for interventions and patient self-care actives.  Follow up Plan: No follow up scheduled with CCM LCSW at this time. Will follow up with patient in 4 to 5 weeks .Patient will call office if needed prior to next encounter.   Review of patient past medical history, allergies, medications, and health status, including review of pertinent consultant reports was performed as part of comprehensive evaluation and provision of care management/care coordination services.   SDOH (Social Determinants of Health) screening and interventions performed today:   Advanced Directives Status:Not addressed in this encounter.     Care Plan    Conditions to be addressed/monitored per PCP order: Depression,   Care Plan : General Social Work (Adult)  Updates made by Maurine Cane, LCSW since 01/15/2021 12:00 AM     Problem: Emotional  Distress      Goal: Emotional Health Supported by connecting with a therapist   Start Date: 11/16/2020  This Visit's Progress: On track  Recent Progress: On track  Priority: High  Note:   Current barriers:   Chronic Mental Health needs related to managing symptoms of anxiety and depression  Needs Support, Education, and Care Coordination in order to meet unmet mental health needs. Clinical Goal(s): patient will work with mental health provider to address needs related to managing symptoms   Clinical Interventions:  Collaborate with [facility] to get appointment scheduled  02/20/21 Inter-disciplinary care team collaboration (see longitudinal plan of care) Patient Goals/Self-Care Activities:  Follow up with [facility] for community food options  Wednesday 3pm-7pm, Friday 11am-2pm, and Saturday 9am-12pm  You can go in and shop for what you need Continue with compliance of taking medication  Look for E-mail from [facility]; Review your EMMI educational information on [place] NIGHT'S SLEEP and [place] appointment 02/20/21 with [place]  [place], [place] / [place]   [phone]

## 2021-01-15 NOTE — Patient Instructions (Signed)
Licensed Clinical Social Worker Visit Information  Goals we discussed today:   Goals Addressed             This Visit's Progress    Coping Skills Enhanced ( connect with counseling and community support   On track    Patient Goals/Self-Care Activities:  Keep appointment 12/20/22with [facility]  [phone]          Materials provided: Yes: see above  Ms. Mcconnell was given information about Care Management services today including:  Care Management services include personalized support from designated clinical staff supervised by her physician, including individualized plan of care and coordination with other care providers 24/7 contact phone numbers for assistance for urgent and routine care needs. The patient may stop Care Management services at any time by phone call to the office staff.  Patient agreed to services and verbal consent obtained.   Patient verbalizes understanding of instructions provided today and agrees to view in [place].  It was a pleasure speaking with you today. No follow up scheduled, per our conversation I will contact you in 4 to 5 weeks.  Please call the office if needed prior to next encounter.  Casimer Lanius, LCSW Care Management & Coordination  [phone]

## 2021-01-16 ENCOUNTER — Telehealth (HOSPITAL_COMMUNITY): Payer: Self-pay

## 2021-01-16 ENCOUNTER — Other Ambulatory Visit (HOSPITAL_COMMUNITY): Payer: 59 | Admitting: Dentistry

## 2021-02-13 ENCOUNTER — Other Ambulatory Visit (HOSPITAL_COMMUNITY): Payer: 59 | Admitting: Dentistry

## 2021-02-14 ENCOUNTER — Other Ambulatory Visit: Payer: Self-pay

## 2021-02-14 ENCOUNTER — Encounter (HOSPITAL_COMMUNITY): Payer: Self-pay | Admitting: Dentistry

## 2021-02-14 ENCOUNTER — Ambulatory Visit (INDEPENDENT_AMBULATORY_CARE_PROVIDER_SITE_OTHER): Payer: 59 | Admitting: Dentistry

## 2021-02-14 VITALS — BP 121/72 | HR 78 | Temp 98.7°F

## 2021-02-14 DIAGNOSIS — Z0120 Encounter for dental examination and cleaning without abnormal findings: Secondary | ICD-10-CM

## 2021-02-14 DIAGNOSIS — K036 Deposits [accretions] on teeth: Secondary | ICD-10-CM

## 2021-02-14 DIAGNOSIS — M2632 Excessive spacing of fully erupted teeth: Secondary | ICD-10-CM

## 2021-02-14 DIAGNOSIS — K083 Retained dental root: Secondary | ICD-10-CM

## 2021-02-14 DIAGNOSIS — K117 Disturbances of salivary secretion: Secondary | ICD-10-CM

## 2021-02-14 DIAGNOSIS — K08109 Complete loss of teeth, unspecified cause, unspecified class: Secondary | ICD-10-CM

## 2021-02-14 DIAGNOSIS — K0850 Unsatisfactory restoration of tooth, unspecified: Secondary | ICD-10-CM

## 2021-02-14 DIAGNOSIS — K030 Excessive attrition of teeth: Secondary | ICD-10-CM

## 2021-02-14 DIAGNOSIS — K0530 Chronic periodontitis, unspecified: Secondary | ICD-10-CM

## 2021-02-14 DIAGNOSIS — K0889 Other specified disorders of teeth and supporting structures: Secondary | ICD-10-CM

## 2021-02-14 DIAGNOSIS — K045 Chronic apical periodontitis: Secondary | ICD-10-CM

## 2021-02-14 DIAGNOSIS — K029 Dental caries, unspecified: Secondary | ICD-10-CM

## 2021-02-14 DIAGNOSIS — Z923 Personal history of irradiation: Secondary | ICD-10-CM

## 2021-02-14 DIAGNOSIS — M270 Developmental disorders of jaws: Secondary | ICD-10-CM

## 2021-02-14 DIAGNOSIS — Z85818 Personal history of malignant neoplasm of other sites of lip, oral cavity, and pharynx: Secondary | ICD-10-CM

## 2021-02-14 NOTE — Patient Instructions (Addendum)
[place], D.M.D. Phone: [phone] Fax: [phone]   It was a pleasure seeing you today!  Please refer to the information below regarding your dental visit with [place].  Call us if any questions or concerns come up after you leave.   Thank you for giving [place] the opportunity to provide care for you.  If there is anything we can do for you, please let us know.   TODAY'S VISIT:  Psychiatric nurse (radiation oncology nurse navigator) in regards to possibly finding someone from the social work department for guidance on reapplying for dental insurance. Thursday, February 3rd - tentative O.R. date for extractions under general anesthesia.  You will need to have an updated History and Physical within 30 days of this date.   Call the pre-service center at [facility] ([place]) with any insurance or pre-authorization questions.  You can reach them at [phone].    RECOMMENDATIONS: Brush after meals and at bedtime.  Floss once a day, and use fluoride at bedtime. Use CLOSYs for dry mouth, and salt water/baking soda rinses to help with any mouth sores. Take multiple sips of water as needed.  Stay hydrated. Return for routine dental care including cleanings and periodic exams. Call us if any problems or concerns arise.    QUESTIONS? Call our office during office hours at [phone].

## 2021-02-14 NOTE — Progress Notes (Signed)
Department of Dental Medicine   Service Date:   02/14/2021  Patient Name:  Shelley Mason. Gregg Date of Birth:   09/03/1965 Medical Record Number: [id number]  Referring Provider:           Eppie Gibson, M.D.   OUTPATIENT CONSULTATION PLAN/RECOMMENDATIONS   ASSESSMENT: There are no current signs of acute odontogenic infection including abscess, edema or erythema, or suspicious lesion requiring biopsy.   Several carious teeth, retained root tips; periodontal findings include accretions, bone loss & loose teeth.  RECOMMENDATIONS: Extractions of all indicated teeth to decrease the risk of postoperative/post-radiation infection and complications. Establish care at a dental office of the patient's choice for comprehensive treatment including cleanings/periodontal therapy, restorative & replacement of teeth as needed.  PLAN: Discuss case with medical team. Recommend dental treatment be completed in the operating room under general anesthesia, pending medical team's recommendations. O.R. case on Thursday, February 2nd. Confirmed with the patient that she will have an updated H&P prior to surgery date.  Discussed in detail all treatment options and recommendations with the patient and they are agreeable to the plan.    Thank you for consulting with [facility] and for the opportunity to participate in this patient's treatment.  Should you have any questions or concerns, please contact the [facility] at [phone].       02/14/2021 CONSULT NOTE:    COVID-19 SCREENING:  The patient denies symptoms concerning for COVID-19 infection including fever, chills, cough, or newly developed shortness of breath.   HISTORY OF PRESENT ILLNESS: Shelley Mason is a very pleasant 55 y.o. female with h/o anemia, anxiety, GERD, tobacco use (previously quit, now current user- cigarettes), depression, OSA and parotid gland cancer s/p left parotidectomy (2019) and radiation (from  09/22/17-11/06/17) who was referred by Dr. Eppie Gibson for an outpatient dental consultation to evaluate poor dentition following radiation therapy.  The patient presents today to discuss extractions and other potential treatment options.     DENTAL HISTORY: The patient was previously seen in the hospital dental clinic on 09/18/2017 for a pre-radiotherapy dental exam which was completed by Dr. Enrique Sack.  At that time, he recommended extractions of teeth numbers 2 and 30, however due to urgency, Dr. Isidore Moos preferred to defer dental care until post-therapy.  She was also seen by Dr. Enrique Sack for an oral examination status post radiation therapy on 12/23/17.  At that time, a referral was made to Dr. Hoyt Koch for extractions of teeth numbers 2 and 30.  It appears that the patient never ended up seeing Dr. Hoyt Koch for extractions. Of note, the patient reports that she has been having much difficulty in obtaining dental insurance coverage and disability approval since her cancer treatment.  She says that she is currently in the middle of appealing decisions that have been made in regards to this and says that she really appreciates [place] seeing her for a dental consult at the request of Dr. Isidore Moos as she has been unable to see another dentist for regular care due to financial constraints following her cancer diagnosis. Patient is able to manage oral secretions.  Patient denies dysphagia, odynophagia, dysphonia, SOB and neck pain.  Patient denies fever, rigors and malaise.   CHIEF COMPLAINT:  Here for a dental exam to discuss extractions of infected and broken down teeth as well as comprehensive dental care.  She states that since she has completed radiation for head and neck cancer, she has had multiple teeth break off, pain related to her  teeth and other concerns regarding her overall oral health.   Patient Active Problem List   Diagnosis Date Noted   Healthcare maintenance 09/20/2019   Vestibular disorder  08/16/2019   Gastroesophageal reflux disease 04/23/2019   Obesity (BMI 35.0-39.9 without comorbidity) 04/23/2019   MDD (major depressive disorder), recurrent episode, mild ([place]) 02/16/2019   Anxiety 02/16/2019   Vertigo 11/19/2018   Sensorineural hearing loss (SNHL) of left ear with unrestricted hearing of right ear 10/28/2017   History of malignant neoplasm of parotid gland 09/17/2017   Depression 04/21/2017   Cigarette smoker 06/21/2015   Past Medical History:  Diagnosis Date   Anemia    history of   Anxiety    Cigarette smoker    Depression    Fibroids    history of uterine fibroids   GERD (gastroesophageal reflux disease)    Hearing loss    Hearing loss    History of radiation therapy 09/22/17- 11/06/17   tumor bed left neck with added margin o facial nerve.    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(HCC)    Vertigo    Past Surgical History:  Procedure Laterality Date   CESAREAN SECTION     x 3   COLONOSCOPY     CYSTOSCOPY     LAPAROSCOPIC ASSISTED VAGINAL HYSTERECTOMY     PAROTIDECTOMY Left    TUBAL LIGATION     Allergies  Allergen Reactions   Penicillins Anaphylaxis and Rash    Has patient had a PCN reaction causing immediate rash, facial/tongue/throat swelling, SOB or lightheadedness with hypotension: Yes Has patient had a PCN reaction causing severe rash involving mucus membranes or skin necrosis: Yes Has patient had a PCN reaction that required hospitalization: Yes Has patient had a PCN reaction occurring within the last 10 years: No If all of the above answers are "NO", then may proceed with Cephalosporin use.    Codeine Rash   Current Outpatient Medications  Medication Sig Dispense Refill   buPROPion (WELLBUTRIN SR) 150 MG 12 hr tablet Take 1 tablet (150 mg total) by mouth 2 (two) times daily. 90 tablet 3   diclofenac Sodium (VOLTAREN) 1 % GEL Apply four times daily to [facility] (Patient not taking: Reported  on 12/22/2020) 150 g 1   FLUoxetine (PROZAC) 20 MG capsule Take 20 mg by mouth daily.     meclizine (ANTIVERT) 25 MG tablet Take 1 tablet (25 mg total) by mouth daily as needed for dizziness. 30 tablet 0   nicotine polacrilex (NICORETTE) 4 MG gum Take 1 each (4 mg total) by mouth as needed for smoking cessation. (Patient not taking: Reported on 12/22/2020) 100 tablet 0   omeprazole (PRILOSEC) 40 MG capsule Take 1 capsule (40 mg total) by mouth daily. 90 capsule 3   sucralfate (CARAFATE) 1 g tablet Take 1 tablet (1 g total) by mouth 2 (two) times daily as needed. (Patient not taking: Reported on 12/22/2020) 60 tablet 3   No current facility-administered medications for this visit.    LABS: Lab Results  Component Value Date   WBC 10.9 (H) 07/27/2019   HGB 12.8 07/27/2019   HCT 39.9 07/27/2019   MCV 94.3 07/27/2019   PLT 270 07/27/2019      Component Value Date/Time   NA 141 07/27/2019 1925   NA 144 04/23/2019 1427   K 3.5 07/27/2019 1925   CL 106 07/27/2019 1925   CO2 28 07/27/2019 1925  GLUCOSE 96 07/27/2019 1925   BUN 12 07/27/2019 1925   BUN 9 04/23/2019 1427   CREATININE 0.95 07/27/2019 1925   CALCIUM 9.1 07/27/2019 1925   GFRNONAA >60 07/27/2019 1925   GFRAA >60 07/27/2019 1925   No results found for: INR, PROTIME No results found for: PTT  Social History   Socioeconomic History   Marital status: Divorced    Spouse name: Not on file   Number of children: 3   Years of education: Not on file   Highest education level: Not on file  Occupational History    Employer: [employer]  Tobacco Use   Smoking status: Every Day    Packs/day: 1.00    Years: 19.00    Pack years: 19.00    Types: Cigarettes    Start date: 06/20/2000   Smokeless tobacco: Never   Tobacco comments:    She had quit, but restarted due to stress.   Vaping Use   Vaping Use: Never used  Substance and Sexual Activity   Alcohol use: Not Currently    Alcohol/week: 0.0 standard drinks    Comment:  occasionally   Drug use: No   Sexual activity: Yes    Birth control/protection: None, Surgical  Other Topics Concern   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      If she couldn't take --> Nettie Bullard would make decisions       Smokes cigarettes    Social Determinants of Health   Financial Resource Strain: Not on file  Food Insecurity: Food Insecurity Present   Worried About Charity fundraiser in the Last Year: Sometimes true   Ran Out of Food in the Last Year: Never true  Transportation Needs: Unknown   Lack of Transportation (Medical): No   Lack of Transportation (Non-Medical): Not on file  Physical Activity: Not on file  Stress: Stress Concern Present   Feeling of Stress : Very much  Social Connections: Not on file  Intimate Partner Violence: Not on file   Family History  Problem Relation Age of Onset   Hyperlipidemia Mother    Hypertension Mother    Heart failure Mother    Depression Mother    Alcohol abuse Mother    Prostate cancer Father    Alcohol abuse Father    Hyperlipidemia Sister    Heart disease Other        family history   Diabetes Other        family history   Hypertension Other        family history   Colon cancer Neg Hx    Colon polyps Neg Hx    Esophageal cancer Neg Hx    Rectal cancer Neg Hx    Stomach cancer Neg Hx      REVIEW OF SYSTEMS:  Reviewed with the patient as per HPI. Psych:  Patient denies having dental phobia.   VITAL SIGNS: BP 121/72 (BP Location: Right Arm, Patient Position: Sitting, Cuff Size: Normal)    Pulse 78    Temp 98.7 F (37.1 C) (Oral)    PHYSICAL EXAM: General:  Well-developed, comfortable and in no apparent distress. Neurological:  Alert and oriented to person, place and  time. Extraoral:  Facial symmetry present without any edema or erythema.  No swelling or lymphadenopathy.  TMJ asymptomatic without clicks or  crepitations.  Intraoral:  Soft tissues  appear well-perfused and mucous membranes moist.  FOM and vestibules soft and not raised. Oral cavity without mass or lesion. No signs of infection, parulis, sinus tract, edema or erythema evident upon exam.  Small palatal tori. (+) Mucous membranes:  Thick and viscous salivary secretions   DENTAL EXAM: Hard tissue exam completed and charted.    Overall impression:  Fair remaining dentition.    Oral hygiene:  Poor     Periodontal:  Pink, healthy gingival tissue with blunted papilla with areas of inflamed and erythematous tissue.  Generalized calculus accumulation.  Of note:  Attempted to record probing depths today. The patient reported severe sensitivity upon probing and so we were unable to measure at today's visit. FMD is needed prior to completing comprehensive periodontal exam. (+) Mobility:  class I- #12; class II- #18 Caries:  #2, #3, #4, #5, #12, #18, #20, #21, #22, #29 Retained root tips:  #14, #30 Defective restorations:  #9 existing resin restoration w/ open margins susceptible to recurrent decay on RCT tooth Endodontics:   #9 has had previous RCT without definitive restoration Removable/fixed prosthodontics:  Patient denies wearing partial dentures.  Occlusion:  Unable to assess molar occlusion. Non-functional teeth:  #2, #3 Supra-erupted teeth:  #2, #3 Other findings:   (+) Attrition/wear:  #6-#11 incisal; #23-#27 incisal (+) Diastema(s):  #6  #7  #8  #9  #10  #11; #20  #21  #22  #23  #24  #25  #26  #27   RADIOGRAPHIC EXAM:  Full Mouth Series exposed and interpreted. PAN (taken 12/23/2017) was reviewed.       Condyles seated bilaterally in fossas.  No evidence of abnormal pathology.  All visualized osseous structures appear WNL. Missing teeth #1, #13, #16, #17, #19, #28, #31 & #32.  #2, #3, #14 appear supra-erupted.        Generalized moderate horizontal bone loss consistent with moderate periodontitis.  Radiographic calculus  evident.  Severe horizontal & vertical bone loss on #2; #18 has mesial & distal vertical bone loss & shows signs of possible developing radiolucency on distal root.     Missing teeth. Existing restorations evident on #3, #15 & #18.  #9 has been previously endodontically treated w/ no full-coverage restoration/crown; gutta percha full appears adequate & is ~1-2 mm short of apex.  Retained root tips-  #14 & #30 w/ periapical radiolucencies; #18 w/ signs of developing periapical radiolucency.  Caries- #68M, #36M&D, #17M&D, #66M&D, #10M, #20D, #22I & #29D.  #23 fractured/broken tooth.   ASSESSMENT:  1.  History of radiation therapy to the head and neck 2.  History of parotid gland cancer 3.  Encounter for dental examination 4.  Missing teeth 5.  Radiation caries 6.  Retained root tips 7.  Chronic apical periodontitis 8.  Accretions on teeth 9.  Loose teeth 10.  Chronic periodontitis 11.  Diastema(s) 12.  Attrition/wear 13.  Xerostomia 14.  Defective dental restoration 15.  Palatal tori   PLAN AND RECOMMENDATIONS: I discussed the risks, benefits, and complications of various scenarios with the patient in relationship to their medical and dental conditions, which included systemic infection or other serious issues such as osteoradionecrosis that could potentially occur if dental/oral concerns are not addressed.  I explained that if any chronic or acute dental/oral infection(s) are addressed and subsequently not maintained, the risk of the previously mentioned complications remain just as high.  I explained all significant findings of the dental consultation with the patient including several teeth with caries, #14 and #  30 are retained roots, periodontal/gum concerns including loose teeth #12 and #18 (#18 also has caries and periapical infection), tartar or calculus build-up causing chronic inflammation and infection of her gums leading to bone loss and bleeding/pain on probing, and the recommended care  to complete the disease-control phase of treatment (extractions of hopeless teeth or teeth with poor prognoses- #2, #3, #14, #18, #30 and full mouth debridement), followed by comprehensive care (treatment planning, restorative, any other indicated extractions following debridement, re-evaluation of periodontal status, replacement of missing teeth, etc).  The patient verbalized understanding of all findings, discussion, and recommendations. We then discussed various treatment options to include no treatment, multiple extractions with alveoloplasty, pre-prosthetic surgery as indicated, periodontal therapy, dental restorations, root canal therapy, crown and bridge therapy, implant therapy, and replacement of missing teeth as indicated.  The patient verbalized understanding of all options, and currently wishes to proceed with extractions and debridement as recommended and then returning for comprehensive care.   Recommend that initial treatment be completed in the operating room under general anesthesia due to the patient's history of head and neck radiation so we are able to complete all invasive work (extractions and cleaning of inflamed/infected gingiva) and reduce the risk for developing ORN.  The patient is agreeable to this plan.  She also understands that she will need an updated H&P prior to her surgery date. Plan to discuss all findings and recommendations with medical team and coordinate future care as needed.   Tentative O.R. date scheduled for Thursday, February 2nd for extractions & full mouth debridement.  All questions and concerns were invited and addressed.  The patient tolerated today's visit well and departed in stable condition. I spent in excess of 120 minutes during the conduct of this consultation and >50% of this time involved direct face-to-face encounter for counseling and/or coordination of the patient's care.      [place], D.M.D.

## 2021-02-20 ENCOUNTER — Ambulatory Visit: Payer: 59 | Admitting: Psychologist

## 2021-02-21 ENCOUNTER — Ambulatory Visit: Payer: Self-pay | Admitting: Licensed Clinical Social Worker

## 2021-02-21 NOTE — Patient Instructions (Addendum)
° °  I  will disconnect from patient's care team at this time, due to transiting out of the current clinic Please call [place] to reschedule your appointment  Patient was not contacted during this encounter.  LCSW collaborated with care team to accomplish patient's care plan goal   Casimer Lanius, [facility]  [phone]

## 2021-02-21 NOTE — Chronic Care Management (AMB) (Signed)
°  Care Management   Note  02/21/2021 Name: Shelley Mason MRN: [id number] DOB: 1965/11/07  Shelley Mason is a 55 y.o. year old female who is a primary care patient of Shelley Malay, MD. The CCM team was consulted reference care coordination needs for  therapy resources .  Assessment: Patient was not interviewed or contacted during this encounter. See Care Plan or interventions for patient self-care actives.   Intervention:Conducted brief assessment, recommendations and relevant information discussed.   Follow up Plan:  Patient does not require continued follow-up by CCM LCSW. Will contact the office if needed CCM LCSW will disconnect from patient's care team at this time, due to transiting out of the current clinic CCM Team will be available at any time by submitting a new referral.    Review of patient past medical history, allergies, medications, and health status, including review of pertinent consultant reports was performed as part of comprehensive evaluation and provision of care management/care coordination services.   Care Plan Conditions to be addressed/monitored per PCP order: Depression,    Care Plan : General Social Work (Adult)  Updates made by Shelley Cane, LCSW since 02/21/2021 12:00 AM     Problem: Emotional Distress      Goal: Emotional Health Supported by connecting with a therapist Completed 02/21/2021  Start Date: 11/16/2020  Recent Progress: On track  Priority: High  Note:   Current barriers:  Patient has all needed information to connect for therapy. Will complete goal  Chronic Mental Health needs related to managing symptoms of anxiety and depression  Needs Support, Education, and Care Coordination in order to meet unmet mental health needs. Clinical Goal(s): patient will work with mental health provider to address needs related to managing symptoms   Clinical Interventions:  Collaborate with [place] to get appointment scheduled   02/20/21 / appointment canceled in system Inter-disciplinary care team collaboration (see longitudinal plan of care) Patient Goals/Self-Care Activities:  Follow up with Shelley Mason, [place] / [place]   [phone]

## 2021-03-06 ENCOUNTER — Encounter: Payer: Self-pay | Admitting: Family Medicine

## 2021-03-06 ENCOUNTER — Other Ambulatory Visit: Payer: Self-pay

## 2021-03-06 ENCOUNTER — Ambulatory Visit (INDEPENDENT_AMBULATORY_CARE_PROVIDER_SITE_OTHER): Payer: 59 | Admitting: Family Medicine

## 2021-03-06 DIAGNOSIS — Z720 Tobacco use: Secondary | ICD-10-CM | POA: Diagnosis not present

## 2021-03-06 DIAGNOSIS — F17210 Nicotine dependence, cigarettes, uncomplicated: Secondary | ICD-10-CM

## 2021-03-06 DIAGNOSIS — R1319 Other dysphagia: Secondary | ICD-10-CM

## 2021-03-06 DIAGNOSIS — K219 Gastro-esophageal reflux disease without esophagitis: Secondary | ICD-10-CM

## 2021-03-06 DIAGNOSIS — F3341 Major depressive disorder, recurrent, in partial remission: Secondary | ICD-10-CM

## 2021-03-06 MED ORDER — BUPROPION HCL ER (SR) 150 MG PO TB12: 150.0000 mg | ORAL_TABLET | Freq: Two times a day (BID) | ORAL | 3 refills | Status: DC

## 2021-03-06 MED ORDER — FLUOXETINE HCL 20 MG PO CAPS: 20.0000 mg | ORAL_CAPSULE | Freq: Every day | ORAL | 1 refills | Status: DC

## 2021-03-06 MED ORDER — NICOTINE POLACRILEX 4 MG MT GUM: 4.0000 mg | CHEWING_GUM | OROMUCOSAL | 0 refills | Status: DC | PRN

## 2021-03-06 MED ORDER — SUCRALFATE 1 G PO TABS: 1.0000 g | ORAL_TABLET | Freq: Two times a day (BID) | ORAL | 3 refills | Status: DC | PRN

## 2021-03-06 MED ORDER — TRIAMCINOLONE ACETONIDE 0.1 % EX OINT: 1.0000 "application " | TOPICAL_OINTMENT | Freq: Two times a day (BID) | CUTANEOUS | 2 refills | Status: DC

## 2021-03-06 MED ORDER — OMEPRAZOLE 40 MG PO CPDR: 40.0000 mg | DELAYED_RELEASE_CAPSULE | Freq: Every day | ORAL | 3 refills | Status: DC

## 2021-03-06 NOTE — Assessment & Plan Note (Signed)
Reports she feels is well controlled.  Refilled her medications

## 2021-03-06 NOTE — Assessment & Plan Note (Signed)
Improving.  Plans to stop in March. Discussed benefits of stopping even a few days before surgery

## 2021-03-06 NOTE — Patient Instructions (Signed)
Good to see you today - Thank you for coming in  Things we discussed today:  I think you are fine for surgery as long as you get the flu and covid vaccines and your blood work is ok  I will call you if your tests are not good.  Otherwise, I will send you a message on MyChart (if it is active) or a letter in the mail..  If you do not hear from me with in 2 weeks please call our office.     Keep working on stopping smoking - certainly for a few days before surgery would help prevent pneumonia

## 2021-03-06 NOTE — Progress Notes (Signed)
° ° °  SUBJECTIVE:   CHIEF COMPLAINT / HPI:   Here for preop evaluation prior to teeth extractions/repair that will require general anesthesia.  No history of heart or lung disease.  Walks regularly without chest pain or shortness of breath   Depression Feels this is stable on her current medications   GERD No bleeding or food sticking.  Well controlled with daily PPI  Tobacco - smokes 1/2 ppd down from 1 ppd about a year ago.  Never was able to get nicorette gum.  Quit date is in March   Hand rash - history of itchy dry rash on palms.  Usually controlled with triancinolone but ran out   PERTINENT  PMH / PSH: Tobacco, Depression GERD Parotid cancer  Stopped alcohol 2 months ago   OBJECTIVE:   BP 120/74    Pulse (!) 103    Wt 196 lb 9.6 oz (89.2 kg)    SpO2 100%    BMI 32.22 kg/m   Heart - Regular rate and rhythm.  No murmurs, gallops or rubs.    Lungs:  Normal respiratory effort, chest expands symmetrically. Lungs are clear to auscultation, no crackles or wheezes. Abdomen: soft and non-tender without masses, organomegaly or hernias noted.  No guarding or rebound Extremities:  No cyanosis, edema, or deformity noted with good range of motion of all major joints.   Neck:  No thyromegaly, masses, or tenderness noted.   Supple with full range of motion without pain. Well healed surgery scar from L parotid cancer Hands - diffusely dry with mild flaking   ASSESSMENT/PLAN:   Cigarette smoker Improving.  Plans to stop in March. Discussed benefits of stopping even a few days before surgery   Gastroesophageal reflux disease Reports is well controlled with regular PPI use and intermittent carafate.  No red flags.  Continue   Depression Reports she feels is well controlled.  Refilled her medications    PreOp clearance Did not have a form.  Given low risk procedure and no history of CAD or pulmonary problems.  Is low risk for complications and does not need further work up other that check  CBC with history of GERD.  Recommend flu and covid vaccine prior to surgery  Lind Covert, MD [place]

## 2021-03-06 NOTE — Assessment & Plan Note (Signed)
Reports is well controlled with regular PPI use and intermittent carafate.  No red flags.  Continue

## 2021-03-06 NOTE — H&P (View-Only) (Signed)
° ° °  SUBJECTIVE:   CHIEF COMPLAINT / HPI:   Here for preop evaluation prior to teeth extractions/repair that will require general anesthesia.  No history of heart or lung disease.  Walks regularly without chest pain or shortness of breath   Depression Feels this is stable on her current medications   GERD No bleeding or food sticking.  Well controlled with daily PPI  Tobacco - smokes 1/2 ppd down from 1 ppd about a year ago.  Never was able to get nicorette gum.  Quit date is in March   Hand rash - history of itchy dry rash on palms.  Usually controlled with triancinolone but ran out   PERTINENT  PMH / PSH: Tobacco, Depression GERD Parotid cancer  Stopped alcohol 2 months ago   OBJECTIVE:   BP 120/74    Pulse (!) 103    Wt 196 lb 9.6 oz (89.2 kg)    SpO2 100%    BMI 32.22 kg/m   Heart - Regular rate and rhythm.  No murmurs, gallops or rubs.    Lungs:  Normal respiratory effort, chest expands symmetrically. Lungs are clear to auscultation, no crackles or wheezes. Abdomen: soft and non-tender without masses, organomegaly or hernias noted.  No guarding or rebound Extremities:  No cyanosis, edema, or deformity noted with good range of motion of all major joints.   Neck:  No thyromegaly, masses, or tenderness noted.   Supple with full range of motion without pain. Well healed surgery scar from L parotid cancer Hands - diffusely dry with mild flaking   ASSESSMENT/PLAN:   Cigarette smoker Improving.  Plans to stop in March. Discussed benefits of stopping even a few days before surgery   Gastroesophageal reflux disease Reports is well controlled with regular PPI use and intermittent carafate.  No red flags.  Continue   Depression Reports she feels is well controlled.  Refilled her medications    PreOp clearance Did not have a form.  Given low risk procedure and no history of CAD or pulmonary problems.  Is low risk for complications and does not need further work up other that check  CBC with history of GERD.  Recommend flu and covid vaccine prior to surgery  Lind Covert, MD [place]

## 2021-03-07 ENCOUNTER — Ambulatory Visit: Payer: 59 | Admitting: Psychologist

## 2021-03-07 LAB — CBC
Hematocrit: 44 % (ref 34.0–46.6)
Hemoglobin: 14.5 g/dL (ref 11.1–15.9)
MCH: 29.8 pg (ref 26.6–33.0)
MCHC: 33 g/dL (ref 31.5–35.7)
MCV: 91 fL (ref 79–97)
Platelets: 298 10*3/uL (ref 150–450)
RBC: 4.86 x10E6/uL (ref 3.77–5.28)
RDW: 12.8 % (ref 11.7–15.4)
WBC: 8.3 10*3/uL (ref 3.4–10.8)

## 2021-03-10 DIAGNOSIS — K083 Retained dental root: Secondary | ICD-10-CM | POA: Insufficient documentation

## 2021-03-10 DIAGNOSIS — K0889 Other specified disorders of teeth and supporting structures: Secondary | ICD-10-CM | POA: Insufficient documentation

## 2021-03-10 DIAGNOSIS — K0530 Chronic periodontitis, unspecified: Secondary | ICD-10-CM | POA: Insufficient documentation

## 2021-03-10 DIAGNOSIS — Z923 Personal history of irradiation: Secondary | ICD-10-CM | POA: Insufficient documentation

## 2021-03-10 DIAGNOSIS — K0850 Unsatisfactory restoration of tooth, unspecified: Secondary | ICD-10-CM | POA: Insufficient documentation

## 2021-03-10 DIAGNOSIS — K045 Chronic apical periodontitis: Secondary | ICD-10-CM | POA: Insufficient documentation

## 2021-03-10 DIAGNOSIS — K030 Excessive attrition of teeth: Secondary | ICD-10-CM | POA: Insufficient documentation

## 2021-03-10 DIAGNOSIS — K029 Dental caries, unspecified: Secondary | ICD-10-CM | POA: Insufficient documentation

## 2021-03-10 DIAGNOSIS — K117 Disturbances of salivary secretion: Secondary | ICD-10-CM | POA: Insufficient documentation

## 2021-03-10 DIAGNOSIS — Z0120 Encounter for dental examination and cleaning without abnormal findings: Secondary | ICD-10-CM | POA: Insufficient documentation

## 2021-03-10 DIAGNOSIS — K036 Deposits [accretions] on teeth: Secondary | ICD-10-CM | POA: Insufficient documentation

## 2021-03-10 DIAGNOSIS — M2632 Excessive spacing of fully erupted teeth: Secondary | ICD-10-CM | POA: Insufficient documentation

## 2021-03-10 DIAGNOSIS — K08109 Complete loss of teeth, unspecified cause, unspecified class: Secondary | ICD-10-CM | POA: Insufficient documentation

## 2021-03-10 DIAGNOSIS — M270 Developmental disorders of jaws: Secondary | ICD-10-CM | POA: Insufficient documentation

## 2021-03-16 ENCOUNTER — Encounter: Payer: 59 | Admitting: Family Medicine

## 2021-03-22 ENCOUNTER — Ambulatory Visit: Payer: 59 | Admitting: Psychologist

## 2021-03-23 ENCOUNTER — Other Ambulatory Visit: Payer: Self-pay

## 2021-03-23 ENCOUNTER — Ambulatory Visit (INDEPENDENT_AMBULATORY_CARE_PROVIDER_SITE_OTHER): Payer: 59

## 2021-03-23 DIAGNOSIS — Z23 Encounter for immunization: Secondary | ICD-10-CM

## 2021-03-23 NOTE — Progress Notes (Signed)
Patient presents to nurse clinic for COVID booster and flu vaccination. Tolerated both injections well, sites unremarkable.   See flow sheet.   Talbot Grumbling, RN

## 2021-04-01 ENCOUNTER — Other Ambulatory Visit: Payer: Self-pay | Admitting: Family Medicine

## 2021-04-03 ENCOUNTER — Other Ambulatory Visit: Payer: Self-pay

## 2021-04-03 ENCOUNTER — Encounter (HOSPITAL_COMMUNITY): Payer: Self-pay | Admitting: Dentistry

## 2021-04-03 NOTE — Progress Notes (Signed)
Ms Laneve denies chest pain or shortness of breath. Patient denies having any s/s of Covid in her household.  Patient denies any known exposure to Covid.   PCP is Dr. Dorris Singh. I instructed Ms Scoggins  to shower with antibiotic soap, if it is available.  Dry off with a clean towel. Do not put lotion, powder, cologne or deodorant or makeup.No jewelry or piercings. Men may shave their face and neck. Woman should not shave. No nail polish, artificial or acrylic nails. Wear clean clothes, brush your teeth. Glasses, contact lens,dentures or partials may not be worn in the OR. If you need to wear them, please bring a case for glasses, do not wear contacts or bring a case, the hospital does not have contact cases, dentures or partials will have to be removed , make sure they are clean, we will provide a denture cup to put them in. You will need some one to drive you home and a responsible person over the age of 44 to stay with you for the first 24 hours after surgery.

## 2021-04-04 NOTE — Anesthesia Preprocedure Evaluation (Addendum)
Anesthesia Evaluation  Patient identified by MRN, date of birth, ID band Patient awake    Reviewed: Allergy & Precautions, NPO status , Patient's Chart, lab work & pertinent test results  History of Anesthesia Complications Negative for: history of anesthetic complications  Airway Mallampati: II  TM Distance: >3 FB Neck ROM: Full    Dental  (+) Poor Dentition, Dental Advisory Given   Pulmonary Current Smoker,    Pulmonary exam normal        Cardiovascular negative cardio ROS Normal cardiovascular exam     Neuro/Psych PSYCHIATRIC DISORDERS Anxiety Depression negative neurological ROS     GI/Hepatic Neg liver ROS, GERD  Medicated,  Endo/Other  negative endocrine ROS  Renal/GU negative Renal ROS     Musculoskeletal negative musculoskeletal ROS (+)   Abdominal   Peds  Hematology negative hematology ROS (+)   Anesthesia Other Findings   Reproductive/Obstetrics                            Anesthesia Physical Anesthesia Plan  ASA: 2  Anesthesia Plan: General   Post-op Pain Management: Tylenol PO (pre-op) and Celebrex PO (pre-op)   Induction:   PONV Risk Score and Plan: 3  Airway Management Planned: Nasal ETT  Additional Equipment:   Intra-op Plan:   Post-operative Plan: Extubation in OR  Informed Consent: I have reviewed the patients History and Physical, chart, labs and discussed the procedure including the risks, benefits and alternatives for the proposed anesthesia with the patient or authorized representative who has indicated his/her understanding and acceptance.     Dental advisory given  Plan Discussed with: Anesthesiologist and CRNA  Anesthesia Plan Comments:        Anesthesia Quick Evaluation

## 2021-04-05 ENCOUNTER — Other Ambulatory Visit: Payer: Self-pay

## 2021-04-05 ENCOUNTER — Ambulatory Visit (HOSPITAL_COMMUNITY)
Admission: RE | Admit: 2021-04-05 | Discharge: 2021-04-05 | Disposition: A | Discharge status: Home / Self Care | Payer: 59 | Attending: Dentistry | Admitting: Dentistry

## 2021-04-05 ENCOUNTER — Encounter (HOSPITAL_COMMUNITY): Payer: Self-pay | Admitting: Dentistry

## 2021-04-05 ENCOUNTER — Ambulatory Visit (HOSPITAL_COMMUNITY): Payer: 59 | Admitting: Anesthesiology

## 2021-04-05 ENCOUNTER — Encounter (HOSPITAL_COMMUNITY)
Admission: RE | Disposition: A | Discharge status: Home / Self Care | Payer: Self-pay | Source: Home / Self Care | Attending: Dentistry

## 2021-04-05 DIAGNOSIS — K219 Gastro-esophageal reflux disease without esophagitis: Secondary | ICD-10-CM | POA: Diagnosis not present

## 2021-04-05 DIAGNOSIS — F32A Depression, unspecified: Secondary | ICD-10-CM | POA: Insufficient documentation

## 2021-04-05 DIAGNOSIS — F17210 Nicotine dependence, cigarettes, uncomplicated: Secondary | ICD-10-CM | POA: Diagnosis not present

## 2021-04-05 DIAGNOSIS — K056 Periodontal disease, unspecified: Secondary | ICD-10-CM

## 2021-04-05 DIAGNOSIS — K029 Dental caries, unspecified: Secondary | ICD-10-CM | POA: Insufficient documentation

## 2021-04-05 DIAGNOSIS — R21 Rash and other nonspecific skin eruption: Secondary | ICD-10-CM | POA: Diagnosis not present

## 2021-04-05 HISTORY — DX: Personal history of other medical treatment: Z92.89

## 2021-04-05 HISTORY — PX: MULTIPLE EXTRACTIONS WITH ALVEOLOPLASTY: SHX5342

## 2021-04-05 SURGERY — MULTIPLE EXTRACTION WITH ALVEOLOPLASTY
Anesthesia: General

## 2021-04-05 MED ORDER — ONDANSETRON HCL 4 MG/2ML IJ SOLN
INTRAMUSCULAR | Status: DC | PRN
  Administered 2021-04-05: 4 mg via INTRAVENOUS

## 2021-04-05 MED ORDER — OXYMETAZOLINE HCL 0.05 % NA SOLN: 1.0000 | Freq: Two times a day (BID) | NASAL | Status: DC

## 2021-04-05 MED ORDER — CHLORHEXIDINE GLUCONATE 0.12 % MT SOLN
OROMUCOSAL | Status: AC
  Administered 2021-04-05: 15 mL
  Filled 2021-04-05: qty 15

## 2021-04-05 MED ORDER — LIDOCAINE-EPINEPHRINE 2 %-1:100000 IJ SOLN
INTRAMUSCULAR | Status: DC | PRN
  Administered 2021-04-05: 1.7 mL

## 2021-04-05 MED ORDER — CIPROFLOXACIN IN D5W 400 MG/200ML IV SOLN
400.0000 mg | INTRAVENOUS | Status: AC
  Administered 2021-04-05: 400 mg via INTRAVENOUS
  Filled 2021-04-05: qty 200

## 2021-04-05 MED ORDER — FENTANYL CITRATE (PF) 100 MCG/2ML IJ SOLN
25.0000 ug | INTRAMUSCULAR | Status: DC | PRN
  Administered 2021-04-05: 25 ug via INTRAVENOUS

## 2021-04-05 MED ORDER — MIDAZOLAM HCL 2 MG/2ML IJ SOLN
INTRAMUSCULAR | Status: AC
  Filled 2021-04-05: qty 2

## 2021-04-05 MED ORDER — BUPIVACAINE-EPINEPHRINE (PF) 0.5% -1:200000 IJ SOLN
INTRAMUSCULAR | Status: AC
  Filled 2021-04-05: qty 3.6

## 2021-04-05 MED ORDER — FENTANYL CITRATE (PF) 250 MCG/5ML IJ SOLN
INTRAMUSCULAR | Status: AC
  Filled 2021-04-05: qty 5

## 2021-04-05 MED ORDER — FENTANYL CITRATE (PF) 100 MCG/2ML IJ SOLN
INTRAMUSCULAR | Status: AC
  Filled 2021-04-05: qty 2

## 2021-04-05 MED ORDER — FENTANYL CITRATE (PF) 100 MCG/2ML IJ SOLN
INTRAMUSCULAR | Status: DC | PRN
  Administered 2021-04-05: 50 ug via INTRAVENOUS
  Administered 2021-04-05: 100 ug via INTRAVENOUS

## 2021-04-05 MED ORDER — CHLORHEXIDINE GLUCONATE 0.12 % MT SOLN: 15.0000 mL | Freq: Two times a day (BID) | OROMUCOSAL | 0 refills | Status: AC

## 2021-04-05 MED ORDER — LIDOCAINE 2% (20 MG/ML) 5 ML SYRINGE
INTRAMUSCULAR | Status: AC
  Filled 2021-04-05: qty 5

## 2021-04-05 MED ORDER — METRONIDAZOLE 500 MG/100ML IV SOLN
500.0000 mg | INTRAVENOUS | Status: AC
  Administered 2021-04-05: 500 mg via INTRAVENOUS
  Filled 2021-04-05: qty 100

## 2021-04-05 MED ORDER — OXYMETAZOLINE HCL 0.05 % NA SOLN
NASAL | Status: AC
  Administered 2021-04-05: 1 via NASAL
  Filled 2021-04-05: qty 30

## 2021-04-05 MED ORDER — LACTATED RINGERS IV SOLN: INTRAVENOUS | Status: DC

## 2021-04-05 MED ORDER — DEXAMETHASONE SODIUM PHOSPHATE 10 MG/ML IJ SOLN
INTRAMUSCULAR | Status: AC
  Filled 2021-04-05: qty 1

## 2021-04-05 MED ORDER — LIDOCAINE 2% (20 MG/ML) 5 ML SYRINGE
INTRAMUSCULAR | Status: DC | PRN
  Administered 2021-04-05: 100 mg via INTRAVENOUS

## 2021-04-05 MED ORDER — DEXAMETHASONE SODIUM PHOSPHATE 10 MG/ML IJ SOLN
INTRAMUSCULAR | Status: DC | PRN
  Administered 2021-04-05: 5 mg via INTRAVENOUS

## 2021-04-05 MED ORDER — SUGAMMADEX SODIUM 200 MG/2ML IV SOLN
INTRAVENOUS | Status: DC | PRN
  Administered 2021-04-05: 400 mg via INTRAVENOUS

## 2021-04-05 MED ORDER — AMISULPRIDE (ANTIEMETIC) 5 MG/2ML IV SOLN: 10.0000 mg | Freq: Once | INTRAVENOUS | Status: DC | PRN

## 2021-04-05 MED ORDER — PROPOFOL 10 MG/ML IV BOLUS
INTRAVENOUS | Status: AC
  Filled 2021-04-05: qty 20

## 2021-04-05 MED ORDER — PHENYLEPHRINE 40 MCG/ML (10ML) SYRINGE FOR IV PUSH (FOR BLOOD PRESSURE SUPPORT)
PREFILLED_SYRINGE | INTRAVENOUS | Status: AC
  Filled 2021-04-05: qty 10

## 2021-04-05 MED ORDER — PROMETHAZINE HCL 25 MG/ML IJ SOLN: 6.2500 mg | INTRAMUSCULAR | Status: DC | PRN

## 2021-04-05 MED ORDER — LIDOCAINE-EPINEPHRINE 2 %-1:100000 IJ SOLN
INTRAMUSCULAR | Status: AC
  Filled 2021-04-05: qty 10.2

## 2021-04-05 MED ORDER — AZITHROMYCIN 250 MG PO TABS: 250.0000 mg | ORAL_TABLET | Freq: Every day | ORAL | 0 refills | Status: AC

## 2021-04-05 MED ORDER — MIDAZOLAM HCL 2 MG/2ML IJ SOLN
INTRAMUSCULAR | Status: DC | PRN
  Administered 2021-04-05: 2 mg via INTRAVENOUS

## 2021-04-05 MED ORDER — ONDANSETRON HCL 4 MG/2ML IJ SOLN
INTRAMUSCULAR | Status: AC
  Filled 2021-04-05: qty 2

## 2021-04-05 MED ORDER — 0.9 % SODIUM CHLORIDE (POUR BTL) OPTIME
TOPICAL | Status: DC | PRN
  Administered 2021-04-05: 1000 mL

## 2021-04-05 MED ORDER — BUPIVACAINE-EPINEPHRINE 0.5% -1:200000 IJ SOLN
INTRAMUSCULAR | Status: DC | PRN
  Administered 2021-04-05 (×2): 1.7 mL

## 2021-04-05 MED ORDER — ROCURONIUM BROMIDE 10 MG/ML (PF) SYRINGE
PREFILLED_SYRINGE | INTRAVENOUS | Status: AC
  Filled 2021-04-05: qty 10

## 2021-04-05 MED ORDER — ACETAMINOPHEN 500 MG PO TABS
1000.0000 mg | ORAL_TABLET | Freq: Once | ORAL | Status: AC
  Administered 2021-04-05: 1000 mg via ORAL
  Filled 2021-04-05: qty 2

## 2021-04-05 MED ORDER — ROCURONIUM BROMIDE 10 MG/ML (PF) SYRINGE
PREFILLED_SYRINGE | INTRAVENOUS | Status: DC | PRN
  Administered 2021-04-05: 80 mg via INTRAVENOUS

## 2021-04-05 MED ORDER — CELECOXIB 200 MG PO CAPS
200.0000 mg | ORAL_CAPSULE | Freq: Once | ORAL | Status: AC
  Administered 2021-04-05: 200 mg via ORAL
  Filled 2021-04-05: qty 1

## 2021-04-05 MED ORDER — LACTATED RINGERS IV SOLN
INTRAVENOUS | Status: DC | PRN
Start: 2021-04-05 — End: 2021-04-05

## 2021-04-05 MED ORDER — PROPOFOL 10 MG/ML IV BOLUS
INTRAVENOUS | Status: DC | PRN
  Administered 2021-04-05: 170 mg via INTRAVENOUS

## 2021-04-05 SURGICAL SUPPLY — 37 items
ALCOHOL 70% 16 OZ (MISCELLANEOUS) ×2 IMPLANT
BAG COUNTER SPONGE SURGICOUNT (BAG) ×2 IMPLANT
BAG SPNG CNTER NS LX DISP (BAG) ×1
BLADE SURG 15 STRL LF DISP TIS (BLADE) ×1 IMPLANT
BLADE SURG 15 STRL SS (BLADE) ×2
COVER SURGICAL LIGHT HANDLE (MISCELLANEOUS) ×2 IMPLANT
GAUZE 4X4 16PLY ~~LOC~~+RFID DBL (SPONGE) ×2 IMPLANT
GAUZE PACKING FOLDED 2  STR (GAUZE/BANDAGES/DRESSINGS) ×2
GAUZE PACKING FOLDED 2 STR (GAUZE/BANDAGES/DRESSINGS) ×1 IMPLANT
GLOVE SURG ENC MOIS LTX SZ6.5 (GLOVE) ×2 IMPLANT
GLOVE SURG POLYISO LF SZ6 (GLOVE) ×2 IMPLANT
GOWN STRL REUS W/ TWL LRG LVL3 (GOWN DISPOSABLE) ×2 IMPLANT
GOWN STRL REUS W/TWL LRG LVL3 (GOWN DISPOSABLE) ×4
KIT BASIN OR (CUSTOM PROCEDURE TRAY) ×2 IMPLANT
KIT TURNOVER KIT B (KITS) ×2 IMPLANT
MANIFOLD NEPTUNE II (INSTRUMENTS) ×2 IMPLANT
NDL BLUNT 16X1.5 OR ONLY (NEEDLE) ×1 IMPLANT
NDL DENTAL 27 LONG (NEEDLE) ×2 IMPLANT
NEEDLE BLUNT 16X1.5 OR ONLY (NEEDLE) ×4 IMPLANT
NEEDLE DENTAL 27 LONG (NEEDLE) IMPLANT
NS IRRIG 1000ML POUR BTL (IV SOLUTION) ×2 IMPLANT
PACK EENT II TURBAN DRAPE (CUSTOM PROCEDURE TRAY) ×2 IMPLANT
PAD ARMBOARD 7.5X6 YLW CONV (MISCELLANEOUS) ×2 IMPLANT
SPONGE SURGIFOAM ABS GEL 100 (HEMOSTASIS) ×1 IMPLANT
SPONGE SURGIFOAM ABS GEL 12-7 (HEMOSTASIS) IMPLANT
SPONGE SURGIFOAM ABS GEL SZ50 (HEMOSTASIS) IMPLANT
SUCTION FRAZIER HANDLE 10FR (MISCELLANEOUS) ×2
SUCTION TUBE FRAZIER 10FR DISP (MISCELLANEOUS) ×1 IMPLANT
SUT CHROMIC 3 0 PS 2 (SUTURE) ×3 IMPLANT
SUT CHROMIC 4 0 P 3 18 (SUTURE) IMPLANT
SYR 50ML SLIP (SYRINGE) ×2 IMPLANT
SYR BULB IRRIG 60ML STRL (SYRINGE) ×2 IMPLANT
TOWEL GREEN STERILE FF (TOWEL DISPOSABLE) ×2 IMPLANT
TUBE CONNECTING 12X1/4 (SUCTIONS) ×2 IMPLANT
WATER STERILE IRR 1000ML POUR (IV SOLUTION) ×2 IMPLANT
WATER TABLETS ICX (MISCELLANEOUS) ×2 IMPLANT
YANKAUER SUCT BULB TIP NO VENT (SUCTIONS) ×2 IMPLANT

## 2021-04-05 NOTE — Discharge Instructions (Signed)
[facility] DEPARTMENT OF DENTAL MEDICINE Dr. Ameet Sandy B. Veto Macqueen, D.M.D. Phone: [phone] Fax: [phone]   MOUTH CARE AFTER SURGERY    FACTS: Ice used in ice bag helps keep the swelling down, and can help lessen the pain for the first 24 hours after surgery. It is easier to treat pain BEFORE it happens. Spitting disturbs the clot and may cause bleeding to start again, or to get worse. Smoking delays healing and can cause complications. Sharing prescriptions can be dangerous.  Do not take medications not recently prescribed for you. Antibiotics may stop birth control pills from working.  Use other means of birth control while on antibiotics. Warm salt water rinses after the first 24 hours will help lessen the swelling:  Use 1/2 teaspoonful of table salt per oz.of water.    DO NOT: Spit Drink through a straw It is strongly advised not to smoke, dip snuff or chew tobacco for at least 3 days. Eat sharp or crunchy foods.  Avoid the area of surgery when chewing. Stop your antibiotics before your instructions say to do so. Eat hot foods until bleeding has stopped.  If you need to, let your food cool down to room temperature.      WHAT TO EXPECT: Some swelling, especially during the first 2-3 days. Soreness or discomfort in varying degrees.  Follow your dentist's instructions about how to handle pain before it starts. Pinkish saliva or light blood in saliva, or on your pillow in the morning.  This can last around 24 hours. Bruising inside or outside the mouth.  This may not show up until 2-3 days after surgery.  Don't worry, it will go away in time. Pieces of "bone" may work themselves loose.  It's OK.  If they bother you, let us know.     WHAT TO DO IMMEDIATELY AFTER SURGERY: Bite on gauze with steady pressure for 30-45 minutes at a time.  Switch out the gauze after 30-45 minutes for clean gauze, and continue this for 1-2 hours or until bleeding  subsides. Do not chew on the gauze. Do not lie down flat.  Raise your head support especially for the first 24 hours. Apply ice to your face on the side of the surgery.  You may apply it 20 minutes on and a few minutes off.  Ice for 8-12 hours.  You may use ice up to 24 hours. Before the numbness wears off, take a pain pill as instructed. Prescription pain medication is not always required.     SWELLING: Expect swelling for the first couple of days.  It should get better after that. If swelling increases 3 days or so after surgery, let us know as soon as possible.    FEVER: Take Tylenol every 4 hours if needed to lower your temperature, especially if it is at 100oF or higher. Drink lots of fluids. If the fever does not go away, let us know.    BREATHING: Any unusual difficulty breathing means you have to have someone bring you to the emergency room ASAP.    BLEEDING: Light oozing is expected for 24 hours or so. Prop head up with pillows. Do not spit. Do not confuse bright red fresh flowing blood with lots of saliva colored with a little bit of blood. If you notice some bleeding, place gauze or a tea bag where it is bleeding and apply CONSTANT pressure by biting down for 1 hour.  Avoid talking during this time.  Do not   remove the gauze or tea bag during this hour to "check" the bleeding. If you notice bright RED bleeding FLOWING out of particular area, and filling the floor of your mouth, put a wad of gauze on that area, bite down firmly and constantly.  Call us immediately.  If we're closed, have someone bring you to the emergency room.     ORAL HYGIENE: Brush your teeth as usual after meals and before bedtime. Use a soft toothbrush around the area of surgery. DO NOT AVOID BRUSHING.  Otherwise bacteria(germs) will grow and may delay healing or encourage infection. Since you cannot spit, just gently rinse and let the water flow out of your mouth. DO NOT SWISH HARD.      EATING: Cool liquids are a good point to start.  Increase to soft foods as tolerated.     PRESCRIPTIONS: Follow the directions for your prescriptions exactly as written. If your doctor gave you a narcotic pain medication, do not drive, operate machinery or drink alcohol when on that medication.    Questions?  Call our office during office hours at [phone] or call the Emergency Room at [phone].  

## 2021-04-05 NOTE — Interval H&P Note (Signed)
Anesthesia H&P Update: History and Physical Exam reviewed; patient is OK for planned anesthetic and procedure. ? ?

## 2021-04-05 NOTE — Anesthesia Procedure Notes (Signed)
Procedure Name: Intubation Date/Time: 04/05/2021 7:43 AM Performed by: Moshe Salisbury, CRNA Pre-anesthesia Checklist: Patient identified, Emergency Drugs available, Suction available and Patient being monitored Patient Re-evaluated:Patient Re-evaluated prior to induction Oxygen Delivery Method: Circle System Utilized Preoxygenation: Pre-oxygenation with 100% oxygen Induction Type: IV induction Ventilation: Mask ventilation without difficulty Laryngoscope Size: Mac and 3 Grade View: Grade I Nasal Tubes: Nasal Rae, Nasal prep performed, Left and Magill forceps- large, utilized Tube size: 7.0 mm Number of attempts: 1 Intubation method: 14 fr red rubber catheter used to guide tip of NTT through left nare into oropharynx. Placement Confirmation: ETT inserted through vocal cords under direct vision, positive ETCO2 and breath sounds checked- equal and bilateral Secured at: 26 cm Tube secured with: Tape Dental Injury: Teeth and Oropharynx as per pre-operative assessment

## 2021-04-05 NOTE — Interval H&P Note (Signed)
History and Physical Interval Note:  04/05/2021  Shelley Mason  has presented today for surgery, with the diagnosis of dental caries.  The various methods of treatment have been discussed with the patient and family. After consideration of risks, benefits and other options for treatment, the patient has consented to the procedure(s): MULTIPLE EXTRACTIONS WITH ALVEOLOPLASTY as a surgical intervention.  The patient's history has been reviewed, patient examined, no change in status, stable for surgery.  I have reviewed the patient's chart and labs.  Questions were answered to the patient's satisfaction.       [place] Benson Norway, D.M.D.

## 2021-04-05 NOTE — Op Note (Signed)
Department of Dental Medicine        OPERATIVE REPORT  DATE OF SURGERY:   04/05/2021  PATIENT'S NAME:   Shelley Mason DATE OF BIRTH:   29-Feb-1966 MEDICAL RECORD NUMBER: [id number]  SURGEON:   [place] Romey B. Benson Norway, D.M.D.  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/10/2021   Accretions on teeth 03/10/2021   Loose, teeth 03/10/2021   Chronic periodontitis 03/10/2021   Attrition, teeth excessive 03/10/2021   Diastema of teeth 03/10/2021   Defective dental restoration 03/10/2021   Clinical xerostomia 03/10/2021   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   Healthcare maintenance 09/20/2019   Vestibular disorder 08/16/2019   Gastroesophageal reflux disease 04/23/2019   Obesity (BMI 35.0-39.9 without comorbidity) 04/23/2019   MDD (major depressive disorder), recurrent episode, mild ([place]) 02/16/2019   Anxiety 02/16/2019   Vertigo 11/19/2018   Sensorineural hearing loss (SNHL) of left ear with unrestricted hearing of right ear 10/28/2017   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  MEDICATIONS: Ciprofloxacin IV 400 mg, Metronidazole 500 mg IV prior to invasive dental procedures. Local anesthesia with a total utilization of 2 cartridges of 34 mg of lidocaine with 0.018 mg of epinephrine/ea as well as 1 cartridge with 9 mg of bupivacaine with 0.009 mg of  epinephrine/ea.  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  A medically necessary dental consult was then requested to evaluate the patient for any dental/orofacial infection and their overall oral health.  The patient was examined and subsequently treatment planned for multiple extractions of severely decayed and chronically infected teeth.  This treatment plan was made to decrease the postoperative risks of osteoradionecrosis should the teeth not be removed as well as to decrease complications associated with dental/orofacial infection from affecting the patient's systemic health.  OPERATIVE FINDINGS:  The patient was examined in operating room number 7.  The indicated teeth were identified and verified for extraction. The patient was noted be affected by severe dental decay and periodontal disease.  DESCRIPTION OF PROCEDURE:  The patient was identified in the holding area and brought to the main operating room number 12 by the anesthesia team. The patient was then placed in the supine position on the operating table.  General anesthesia was then induced per the anesthesia team. The patient was then prepped and draped in the usual sterile fashion for dental medicine procedures.  A timeout was performed. The patient was identified and procedures were verified. A throat pack was placed at this time. The oral cavity was then thoroughly examined with the findings noted above. The patient was then ready for  the dental medicine procedure as follows:   ANESTHESIA: Local anesthesia was administered sequentially with a total utilization of 2 cartridges each containing 34 mg of lidocaine with 0.018 mg of epinephrine as well as 1 cartridge  each containing 9 mg bupivacaine with 0.009 mg of epinephrine.  Location of anesthesia included upper right and left quadrants infiltration and palatal; lower left and right quadrants infiltration, lingual and long buccal nerve blocks.    ROUTINE EXTRACTIONS: The maxillary left and right quadrants were first approached. The teeth were then subluxated with a series of straight elevators.  Teeth numbers 2, 3 and 14 were then removed with a 150 forceps and rongeurs without complications.  The tissues were approximated and trimmed appropriately to help achieve primary closure.  The surgical sites were then curetted and irrigated with copious amounts of sterile saline.  Surgi Foam was placed in extraction sites #2 and #3.   The surgical sites were closed using 3-0 chromic gut sutures as follows: 2 figure-8 fashion, 2 simple interrupted.  The mandibular left and right quadrants were then approached. The teeth were subluxated with a series of straight elevators.  Teeth numbers 18 and 30 were then removed utilizing a 23 cowhorn and rongeurs without complications. The tissues were approximated and trimmed appropriately to help achieve primary closure. The surgical sites were then curetted and irrigated with copious amounts of sterile saline.  The surgical sites were closed using 3-0 chromic gut sutures as follows:  1 simple interrupted.  FULL MOUTH DEBRIDEMENT: Removed all supra-gingival calculus and plaque using Kavo with sterile water irrigation and hand instruments.   END OF PROCEDURE: Thorough oral irrigation with sterile saline was performed.  Good hemostasis was observed.  The patient was examined for complications, and seeing none, the dental medicine procedure was deemed to be complete.  The throat pack was removed at this time.  A series of 4x4 gauze were placed in the mouth to aid hemostasis as needed.  The patient was then handed over to the anesthesia team for final disposition.  After an appropriate  amount of time, the patient was extubated and taken to the postanesthsia care unit in stable condition.  All counts were correct for the dental medicine procedure.     Charlaine Dalton, D.M.D.

## 2021-04-05 NOTE — Anesthesia Postprocedure Evaluation (Signed)
Anesthesia Post Note  Patient: Shelley Mason  Procedure(s) Performed: MULTIPLE EXTRACTION WITH ALVEOLOPLASTY     Patient location during evaluation: PACU Anesthesia Type: General Level of consciousness: sedated Pain management: pain level controlled Vital Signs Assessment: post-procedure vital signs reviewed and stable Respiratory status: spontaneous breathing and respiratory function stable Cardiovascular status: stable Postop Assessment: no apparent nausea or vomiting Anesthetic complications: no   No notable events documented.  Last Vitals:  Vitals:   04/05/21 0935 04/05/21 0950  BP: 130/73 123/70  Pulse: 65 74  Resp: 14 17  Temp: 36.6 C   SpO2: 100% 100%    Last Pain:  Vitals:   04/05/21 0935  TempSrc:   PainSc: 0-No pain                 Ashur Glatfelter DANIEL

## 2021-04-05 NOTE — Transfer of Care (Signed)
Immediate Anesthesia Transfer of Care Note  Patient: Shelley Mason  Procedure(s) Performed: MULTIPLE EXTRACTION WITH ALVEOLOPLASTY  Patient Location: PACU  Anesthesia Type:General  Level of Consciousness: drowsy and patient cooperative  Airway & Oxygen Therapy: Patient Spontanous Breathing and Patient connected to face mask oxygen  Post-op Assessment: Report given to RN and Post -op Vital signs reviewed and stable  Post vital signs: Reviewed and stable  Last Vitals:  Vitals Value Taken Time  BP 136/91 04/05/21 0906  Temp    Pulse 88 04/05/21 0906  Resp 14 04/05/21 0906  SpO2 100 % 04/05/21 0906  Vitals shown include unvalidated device data.  Last Pain:  Vitals:   04/05/21 0557  TempSrc: Oral  PainSc: 0-No pain         Complications: No notable events documented.

## 2021-04-06 ENCOUNTER — Encounter (HOSPITAL_COMMUNITY): Payer: Self-pay | Admitting: Dentistry

## 2021-04-13 ENCOUNTER — Ambulatory Visit (INDEPENDENT_AMBULATORY_CARE_PROVIDER_SITE_OTHER): Payer: Self-pay | Admitting: Dentistry

## 2021-04-13 ENCOUNTER — Encounter (HOSPITAL_COMMUNITY): Payer: Self-pay | Admitting: Dentistry

## 2021-04-13 ENCOUNTER — Other Ambulatory Visit: Payer: Self-pay

## 2021-04-13 VITALS — BP 120/75 | HR 95 | Temp 98.5°F

## 2021-04-13 DIAGNOSIS — Z923 Personal history of irradiation: Secondary | ICD-10-CM

## 2021-04-13 DIAGNOSIS — K08199 Complete loss of teeth due to other specified cause, unspecified class: Secondary | ICD-10-CM | POA: Insufficient documentation

## 2021-04-13 NOTE — Progress Notes (Signed)
Department of Dental Medicine   Service Date:   04/13/2021  Patient Name:  Shelley Mason Date of Birth:   [birth year] Medical Record Number: [id number]   TODAY'S VISIT: POSTOPERATIVE APPOINTMENT   ASSESSMENT: The patient continues to heal well and consistent with dental procedures performed.  PLAN: Follow-up in ~1 week.  Discussed in detail all treatment options and recommendations with the patient and they are agreeable to the plan.       04/13/2021 POSTOP NOTE:    COVID-19 SCREENING:  The patient denies symptoms concerning for COVID-19 infection including fever, chills, cough, or newly developed shortness of breath.   HISTORY OF PRESENT ILLNESS: Shelley Mason presents today for a postoperative visit status-post multiple extractions in the operating room on 04/05/21.   Medical and dental history reviewed with the patient.  No changes reported.   CHIEF COMPLAINT:   Here for a postoperative appointment; patient with no complaints.   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   Healthcare maintenance 09/20/2019   Vestibular disorder 08/16/2019   Gastroesophageal reflux disease 04/23/2019   Obesity (BMI 35.0-39.9 without comorbidity) 04/23/2019   MDD (major depressive disorder), recurrent episode, mild ([place]) 02/16/2019   Anxiety 02/16/2019   Vertigo 11/19/2018   Sensorineural hearing loss (SNHL) of left ear with unrestricted hearing of right  ear 10/28/2017   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(HCC)    Vertigo    Past Surgical History:  Procedure Laterality Date   CESAREAN SECTION     x 3   COLONOSCOPY     CYSTOSCOPY     LAPAROSCOPIC ASSISTED VAGINAL HYSTERECTOMY     MULTIPLE EXTRACTIONS WITH ALVEOLOPLASTY N/A 04/05/2021   Procedure: MULTIPLE EXTRACTION WITH ALVEOLOPLASTY;  Surgeon: Charlaine Dalton, DMD;  Location: [facility];  Service: Dentistry;  Laterality: N/A;   PAROTIDECTOMY Left    TUBAL LIGATION     Current Outpatient Medications  Medication Sig Dispense Refill   buPROPion (WELLBUTRIN SR) 150 MG 12 hr tablet TAKE 1 TABLET BY MOUTH TWICE A DAY 180 tablet 2   chlorhexidine (PERIDEX) 0.12 % solution Use as directed 15 mLs in the mouth or throat 2 (two) times daily for 14 days. START USING ON 04/06/2021 120 mL 0   FLUoxetine (PROZAC) 20 MG capsule Take 1 capsule (  20 mg total) by mouth daily. 90 capsule 1   meclizine (ANTIVERT) 25 MG tablet Take 1 tablet (25 mg total) by mouth daily as needed for dizziness. 30 tablet 0   nicotine polacrilex (NICORETTE) 4 MG gum Take 1 each (4 mg total) by mouth as needed for smoking cessation. 100 tablet 0   omeprazole (PRILOSEC) 40 MG capsule Take 1 capsule (40 mg total) by mouth daily. 90 capsule 3   sucralfate (CARAFATE) 1 g tablet Take 1 tablet (1 g total) by mouth 2 (two) times daily as needed. (Patient not taking: Reported on 04/03/2021) 60 tablet 3    triamcinolone ointment (KENALOG) 0.1 % Apply 1 application topically 2 (two) times daily. (Patient taking differently: Apply 1 application topically 2 (two) times daily as needed (eczema).) 30 g 2   No current facility-administered medications for this visit.   Allergies  Allergen Reactions   Penicillins Anaphylaxis and Rash    Has patient had a PCN reaction causing immediate rash, facial/tongue/throat swelling, SOB or lightheadedness with hypotension: Yes Has patient had a PCN reaction causing severe rash involving mucus membranes or skin necrosis: Yes Has patient had a PCN reaction that required hospitalization: Yes Has patient had a PCN reaction occurring within the last 10 years: No If all of the above answers are "NO", then may proceed with Cephalosporin use.    Codeine Rash    LABS: Lab Results  Component Value Date   WBC 8.3 03/06/2021   HGB 14.5 03/06/2021   HCT 44.0 03/06/2021   MCV 91 03/06/2021   PLT 298 03/06/2021   BMET    Component Value Date/Time   NA 141 07/27/2019 1925   NA 144 04/23/2019 1427   K 3.5 07/27/2019 1925   CL 106 07/27/2019 1925   CO2 28 07/27/2019 1925   GLUCOSE 96 07/27/2019 1925   BUN 12 07/27/2019 1925   BUN 9 04/23/2019 1427   CREATININE 0.95 07/27/2019 1925   CALCIUM 9.1 07/27/2019 1925   GFRNONAA >60 07/27/2019 1925    No results found for: INR, PROTIME No results found for: PTT   VITALS: BP 120/75 (BP Location: Right Arm, Patient Position: Sitting, Cuff Size: Normal)    Pulse 95    Temp 98.5 F (36.9 C) (Oral)    EXAM: Extraction sites appear to be healing WNL.  No signs of wound dehiscence or infection evident upon examination. Sutures remain in-tact on the upper right and left.   ASSESSMENT:   Postoperative course is consistent with dental procedures performed.   PROCEDURES: Suture removal.  The patient was given a chlorhexidine gluconate rinse for 30 seconds.  Sutures removed without complication on the upper  left.     PLAN AND RECOMMENDATIONS: Return in 1 week for another follow-up visit . Call if any questions or concerns arise.  All questions and concerns were invited and addressed.  The patient tolerated today's visit well and departed in stable condition.   Charlaine Dalton, D.M.D.

## 2021-04-18 ENCOUNTER — Ambulatory Visit (INDEPENDENT_AMBULATORY_CARE_PROVIDER_SITE_OTHER): Payer: Medicaid Other | Admitting: Dentistry

## 2021-04-18 ENCOUNTER — Other Ambulatory Visit: Payer: Self-pay

## 2021-04-18 VITALS — BP 124/74 | HR 84 | Temp 98.5°F

## 2021-04-18 DIAGNOSIS — K08199 Complete loss of teeth due to other specified cause, unspecified class: Secondary | ICD-10-CM

## 2021-04-18 NOTE — Progress Notes (Signed)
Department of Dental Medicine   Service Date:   04/18/2021  Patient Name:  Shelley Mason Date of Birth:   10/14/1965 Medical Record Number: [id number]   TODAY'S VISIT: POSTOPERATIVE APPOINTMENT   ASSESSMENT: The patient continues to heal well and consistent with dental procedures performed.  PLAN: Return for comprehensive exam & treatment planning Call if any questions or concerns arise.  Discussed in detail all treatment options and recommendations with the patient and they are agreeable to the plan.       04/18/2021 POSTOP NOTE:   HISTORY OF PRESENT ILLNESS: Shelley Mason presents today for a 2nd postoperative visit status-post multiple extractions in the operating room on 04/05/21.  The patient has a history of head and neck radiation therapy.   Medical and dental history reviewed with the patient.  No changes reported.   CHIEF COMPLAINT:   Here for a postop appointment; patient with no complaints.   Patient Active Problem List   Diagnosis Date Noted   Loss of teeth due to extraction 04/13/2021   Periodontal disease    History of radiation to head and neck region 03/10/2021   Encounter for dental examination 03/10/2021   Teeth missing 03/10/2021   Caries 03/10/2021   Retained tooth root 03/10/2021   Chronic apical periodontitis 03/10/2021   Accretions on teeth 03/10/2021   Loose, teeth 03/10/2021   Chronic periodontitis 03/10/2021   Attrition, teeth excessive 03/10/2021   Diastema of teeth 03/10/2021   Defective dental restoration 03/10/2021   Clinical xerostomia 03/10/2021   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   Healthcare maintenance 09/20/2019   Vestibular disorder 08/16/2019   Gastroesophageal reflux disease 04/23/2019   Obesity (BMI 35.0-39.9 without comorbidity) 04/23/2019   MDD (major depressive disorder), recurrent episode, mild ([place]) 02/16/2019   Anxiety 02/16/2019   Vertigo 11/19/2018   Sensorineural hearing loss (SNHL) of left ear with unrestricted  hearing of right ear 10/28/2017   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(HCC)    Vertigo    Past Surgical History:  Procedure Laterality Date   CESAREAN SECTION     x 3   COLONOSCOPY     CYSTOSCOPY     LAPAROSCOPIC ASSISTED VAGINAL HYSTERECTOMY     MULTIPLE EXTRACTIONS WITH ALVEOLOPLASTY N/A 04/05/2021   Procedure: MULTIPLE EXTRACTION WITH ALVEOLOPLASTY;  Surgeon: Charlaine Dalton, DMD;  Location: [facility];  Service: Dentistry;  Laterality: N/A;   PAROTIDECTOMY Left    TUBAL LIGATION     Current Outpatient Medications  Medication Sig Dispense Refill   buPROPion (WELLBUTRIN SR) 150 MG 12 hr tablet TAKE 1 TABLET BY MOUTH TWICE A DAY 180 tablet 2   chlorhexidine (PERIDEX) 0.12 % solution Use as directed 15 mLs in the mouth or throat 2 (two) times daily for 14 days. START USING ON 04/06/2021 120 mL 0  FLUoxetine (PROZAC) 20 MG capsule Take 1 capsule (20 mg total) by mouth daily. 90 capsule 1   meclizine (ANTIVERT) 25 MG tablet Take 1 tablet (25 mg total) by mouth daily as needed for dizziness. 30 tablet 0   nicotine polacrilex (NICORETTE) 4 MG gum Take 1 each (4 mg total) by mouth as needed for smoking cessation. 100 tablet 0   omeprazole (PRILOSEC) 40 MG capsule Take 1 capsule (40 mg total) by mouth daily. 90 capsule 3   sucralfate (CARAFATE) 1 g tablet Take 1 tablet (1 g total) by mouth 2 (two) times daily as needed. (Patient not taking: Reported on 04/03/2021) 60  tablet 3   triamcinolone ointment (KENALOG) 0.1 % Apply 1 application topically 2 (two) times daily. (Patient taking differently: Apply 1 application topically 2 (two) times daily as needed (eczema).) 30 g 2   No current facility-administered medications for this visit.   Allergies  Allergen Reactions   Penicillins Anaphylaxis and Rash    Has patient had a PCN reaction causing immediate rash, facial/tongue/throat swelling, SOB or lightheadedness with hypotension: Yes Has patient had a PCN reaction causing severe rash involving mucus membranes or skin necrosis: Yes Has patient had a PCN reaction that required hospitalization: Yes Has patient had a PCN reaction occurring within the last 10 years: No If all of the above answers are "NO", then may proceed with Cephalosporin use.    Codeine Rash    LABS: Lab Results  Component Value Date   WBC 8.3 03/06/2021   HGB 14.5 03/06/2021   HCT 44.0 03/06/2021   MCV 91 03/06/2021   PLT 298 03/06/2021   BMET    Component Value Date/Time   NA 141 07/27/2019 1925   NA 144 04/23/2019 1427   K 3.5 07/27/2019 1925   CL 106 07/27/2019 1925   CO2 28 07/27/2019 1925   GLUCOSE 96 07/27/2019 1925   BUN 12 07/27/2019 1925   BUN 9 04/23/2019 1427   CREATININE 0.95 07/27/2019 1925   CALCIUM 9.1 07/27/2019 1925   GFRNONAA >60 07/27/2019 1925    No results found for: INR, PROTIME No results found for: PTT   VITALS: BP 124/74 (BP Location: Left Arm, Patient Position: Sitting, Cuff Size: Normal)    Pulse 84    Temp 98.5 F (36.9 C) (Oral)    EXAM: Extraction sites appear to be healing WNL.  No signs of wound dehiscence or infection evident upon examination.  No sutures remain in-tact.   ASSESSMENT:   Postoperative course is consistent with dental procedures performed.   PLAN AND RECOMMENDATIONS: Return for comprehensive exam and treatment planning. Call if any questions or concerns arise before next visit.  All questions and concerns  were invited and addressed.  The patient tolerated today's visit well and departed in stable condition.   Charlaine Dalton, D.M.D.

## 2021-04-24 ENCOUNTER — Ambulatory Visit: Payer: 59 | Admitting: Podiatry

## 2021-04-24 ENCOUNTER — Encounter: Payer: Self-pay | Admitting: Podiatry

## 2021-04-24 ENCOUNTER — Other Ambulatory Visit: Payer: Self-pay

## 2021-04-24 DIAGNOSIS — L603 Nail dystrophy: Secondary | ICD-10-CM | POA: Diagnosis not present

## 2021-04-24 NOTE — Patient Instructions (Signed)
Look for urea 40% gel and apply to the thickened skin and toenail. This can be bought over the counter, at a pharmacy or online such as [employer].  Biotin supplements may help with the brittleness and texture of the nails   Regular pedicures and soaks to alleviate the nail pressure can help. If this does not work return to see me for an ingrowing nail procedure.

## 2021-04-26 NOTE — Progress Notes (Signed)
°  Subjective:  Patient ID: Shelley Mason, female    DOB: [birth year],  MRN: [id number]  Chief Complaint  Patient presents with   Nail Problem    np bil great toenail pain    56 y.o. female presents with the above complaint. History confirmed with patient.  Been going on for about 2 months.  Its worse after working all day she has tried different size shoes and this has not helped.  The right seems to be thicker than the left.  They feel very brittle.  Objective:  Physical Exam: warm, good capillary refill, no trophic changes or ulcerative lesions, normal DP and PT pulses, normal sensory exam, and thickening of the bilateral hallux nail, no active ingrown or paronychia currently..  Assessment:   1. Nail dystrophy      Plan:  Patient was evaluated and treated and all questions answered.  Discussed treatment options with her in detail.  I recommended she begin applying urea gel to the nail and the thickened skin around it.  Also discussed that biotin supplements may help with the brittleness in texture of the nails.  She will look into getting regular pedicures to see if this can alleviate the nail pressure.  We discussed the option of a partial permanent matricectomy if this does not improve and she will return as needed for this.  Return if symptoms worsen or fail to improve.

## 2021-05-11 ENCOUNTER — Other Ambulatory Visit (HOSPITAL_COMMUNITY): Payer: 59 | Admitting: Dentistry

## 2021-05-28 ENCOUNTER — Encounter: Payer: Self-pay | Admitting: Family Medicine

## 2021-05-28 ENCOUNTER — Other Ambulatory Visit: Payer: Self-pay

## 2021-05-28 ENCOUNTER — Ambulatory Visit (INDEPENDENT_AMBULATORY_CARE_PROVIDER_SITE_OTHER): Payer: 59 | Admitting: Family Medicine

## 2021-05-28 VITALS — BP 135/85 | HR 92 | Wt 198.6 lb

## 2021-05-28 DIAGNOSIS — R42 Dizziness and giddiness: Secondary | ICD-10-CM | POA: Diagnosis not present

## 2021-05-28 DIAGNOSIS — Z23 Encounter for immunization: Secondary | ICD-10-CM

## 2021-05-28 DIAGNOSIS — Z85818 Personal history of malignant neoplasm of other sites of lip, oral cavity, and pharynx: Secondary | ICD-10-CM

## 2021-05-28 DIAGNOSIS — Z59819 Housing instability, housed unspecified: Secondary | ICD-10-CM

## 2021-05-28 DIAGNOSIS — Z1239 Encounter for other screening for malignant neoplasm of breast: Secondary | ICD-10-CM | POA: Diagnosis not present

## 2021-05-28 DIAGNOSIS — F17210 Nicotine dependence, cigarettes, uncomplicated: Secondary | ICD-10-CM

## 2021-05-28 DIAGNOSIS — E669 Obesity, unspecified: Secondary | ICD-10-CM

## 2021-05-28 MED ORDER — SHINGRIX 50 MCG/0.5ML IM SUSR: 0.5000 mL | INTRAMUSCULAR | 0 refills | Status: AC

## 2021-05-28 NOTE — Patient Instructions (Signed)
It was wonderful to see you today. ? ?Please bring ALL of your medications with you to every visit.  ? ?Today we talked about: ? ? Going to get blood work ? ?GREAT WORK WITH YOUR HELP ? ?Congratulations on your job ? ?You will be called about the lung cancer screening  ? ? ?Thank you for choosing [facility].  ? ?Please call [phone] with any questions about today's appointment. ? ?Please be sure to schedule follow up at the front  desk before you leave today.  ? ?Dorris Singh, MD  ?Family Medicine  ? ?Please ask your pharmacy to provide you with NALOXONE  ? ? ? ? For crisis assistance, 24/7:  ? ?[facility] ?Crisis: [phone] ?[facility] Access to Care line:  ?[phone] ?[facility]: [address] [place], [place] ?[facility]: [address] [place], [place] ?[employer]: [place], [phone]  ? ?Needle Exchange/Harm reduction:  ?[facility]: [place], [place], [place] locations  ?*All locations, call [phone]  ?[address] [place], [place] ?1-7 Mondays and Tuesdays ?4pm-8pm Thursdays ?1pm-8pm Fridays ?4pm-6pm Sundays ? ?[facility]: [place] ?Wed 2-5, Thurs 3-8 ?[phone] ?http://www.[email or web address]  ?*All Services are Free Primary school teacher, Fentanyl test kits, access to Narcan and training, access to suboxone and methadone, case management, referrals, nurse counseling groups) ? ?                                                                   ? ?[place] ?[facility]: [facility] Drug [facility]: [address] ?[phone] www.adyes.org  ?*Subsidized costs available  ? ?[facility]: [address] ?Mon 9am-5pm, Tues & Thurs 9am-8pm, Wed & Fri 9am-4pm, Sat 9am-1pm  ?[phone] https://al-con-counseling-inc.business.[email or web address]  ? ?[facility]: [place] ?[phone]/ Fax: [phone]  http://www.amethystcares.com/index.html [facility] ? ?[facility]: [place], [place] ?24/7 Walk-In Hours ?[phone]/ Helpline: [phone]/ Fax: [phone] ?https://www.[email or web address] ?*Interpreters Available ?*Accepts Medicare, Medicaid, and uninsured patients  ? ?[facility]: [address], [place], [place] ?Mon-Fri 5am-10am, Sat 6am-8:30am, Janeal Holmes ?[phone]/ Fax: [phone] [facility]: [phone] ?https://www.[email or web address]/ ?*Accepts Medicaid  ? ?[facility]: [address] [place], [place]  ?[phone] [employer]ch  ? ?[facility]  Overdose Loss Support Group ?Please call Bradd Burner at [phone] for: ?Date, time, location, brief intake interview, & registration. ?https://www.authoracare.org/event/overdose-loss-support-group-ongoing/2018-03-03/ ? ?[facility]: [address], [place] ?[phone]/ Fax: [phone]   https://www.[email or web address] ? ?[facility]: [address] [place], [place] ?[phone] http://www.kellinfoundation.org/support-groups.html  ? ?[facility]: [place], [place], [place] ?Mon, Wed, Fri: 9:00am-9:00pm; Tues, Thurs: 9:00am-6:00pm ?[phone]  https://[email or web address]  ?*Habla Espan[employer]  ? ?[facility]: [address] [place], [place] ?Mon-Thurs 9am-6pm, Fri 9am-2pm ?[phone]  http://triadbehavioralresources.com/ ?*Accepts Medicaid ? ?[facility]: [address] [place], [place], [place]  ?[phone] fax) [phone]  https://youthhavenservices.[email or web address]/    ?*Accepts Medicaid ? ?[facility]: [place], [place], [place], [place]  ?[phone] / Fax: [phone]  https://[email or web address] ?  [place] ?[facility]: Alcohol and Drug Services: [address] [place], [place] ?[place] fax: [phone] ? ?[facility]: [place]  [address], [place], [place] ?Mon-Fri 8am-5pm ?[phone]/ Fax: [phone]  ?http://www.[email or web address]/ ?*Accepts Medicare, Medicaid ? ?[facility]: [address], [place] ?[phone] www.caringservices.org ? ?[facility]: [facility]: [address], [place], [place] ?[phone] ?                                                                [place] ?[facility].: [address], [place], [place] [address] ?Phone: [phone] https://[email or web address] ?*Accepts Medicaid  ?                                                               [place] ?[facility] : [address] [place], [place]  ?[phone]/ fax:[phone] https://[email or web address] ?*Accepts Medicaid  ?                                                      [place]  ?[employer] [employer] [facility] [address] [place]  ?[phone] or [phone]  ?Fax: [phone] ?*Accepts Medicaid ?[facility]: [address] [place] ?[phone] [email or web address]://[email or web address]  ?*Accepts Medicare, Medicaid   ? ?These referrals have been provided to you as appropriate for your clinical needs while taking into account your financial concerns. Please be aware that agencies, practitioners and insurance companies sometimes change contracts. When calling to make an appointment have your insurance information available so the professional you are going to see can confirm whether they are covered by your plan. Take this form with you in case the person you are seeing needs a copy or to contact us. ? ? ? ?
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
## 2021-05-28 NOTE — Assessment & Plan Note (Signed)
Recently had several teeth extractions.  Poor dentition was in part related to radiation.  Her dentition is markedly improved.  No new symptoms or swelling.  She is followed by radiation oncology. ?

## 2021-05-28 NOTE — Assessment & Plan Note (Signed)
Discussed cessation.  She is down to 5 cigarettes/day. ?The patient has a 30-year history of tobacco use.  She currently smokes just 5 cigarettes a day but previously for many years smoked 15-20 for many years (21 pack years per chart)  ?

## 2021-05-29 ENCOUNTER — Telehealth: Payer: Self-pay | Admitting: *Deleted

## 2021-05-29 LAB — LIPID PANEL
Chol/HDL Ratio: 4.4 ratio (ref 0.0–4.4)
Cholesterol, Total: 222 mg/dL — ABNORMAL HIGH (ref 100–199)
HDL: 51 mg/dL (ref >39)
LDL Chol Calc (NIH): 158 mg/dL — ABNORMAL HIGH (ref 0–99)
Triglycerides: 74 mg/dL (ref 0–149)
VLDL Cholesterol Cal: 13 mg/dL (ref 5–40)

## 2021-05-29 LAB — HEMOGLOBIN A1C
Est. average glucose Bld gHb Est-mCnc: 117 mg/dL
Hgb A1c MFr Bld: 5.7 % — ABNORMAL HIGH (ref 4.8–5.6)

## 2021-05-29 NOTE — Chronic Care Management (AMB) (Signed)
?  Care Management  ? ?Note ? ?05/29/2021 ?Name: Shelley Mason MRN: [id number] DOB: 1966/02/23 ? ?Shelley Mason is a 55 y.o. year old female who is a primary care patient of Martyn Malay, MD. I reached out to [name] by phone today offer care coordination services.  ? ?Ms. Tappen was given information about care management services today including:  ?Care management services include personalized support from designated clinical staff supervised by her physician, including individualized plan of care and coordination with other care providers ?24/7 contact phone numbers for assistance for urgent and routine care needs. ?The patient may stop care management services at any time by phone call to the office staff. ? ?Patient agreed to services and verbal consent obtained.  ? ?Follow up plan: ?Telephone appointment with care management team member scheduled for:06/27/21 ? ?Laverda Sorenson  ?Care Guide, Embedded Care Coordination ?[facility]  Care Management  ?Direct Dial: [phone] ? ?

## 2021-05-30 ENCOUNTER — Telehealth: Payer: Self-pay | Admitting: Family Medicine

## 2021-05-30 MED ORDER — TRIAMCINOLONE ACETONIDE 0.1 % EX OINT: 1.0000 "application " | TOPICAL_OINTMENT | Freq: Two times a day (BID) | CUTANEOUS | 1 refills | Status: DC | PRN

## 2021-05-30 NOTE — Telephone Encounter (Signed)
Called with results. Discussed starting statin. Has been eating pork ribs. Wants to hold off on statin. Discussed benefits of statin. Would like to repeat at follow up.  ?Refilled steroid cream for dyshidrotic eczema.  ? ?Dorris Singh, MD  ?Family Medicine Teaching Service  ? ?

## 2021-06-14 ENCOUNTER — Ambulatory Visit (HOSPITAL_COMMUNITY): Payer: 59

## 2021-06-22 ENCOUNTER — Encounter: Payer: Self-pay | Admitting: Radiation Oncology

## 2021-06-22 ENCOUNTER — Ambulatory Visit
Admission: RE | Admit: 2021-06-22 | Discharge: 2021-06-22 | Disposition: A | Discharge status: Home / Self Care | Payer: 59 | Source: Ambulatory Visit | Attending: Radiation Oncology | Admitting: Radiation Oncology

## 2021-06-22 VITALS — BP 136/79 | HR 87 | Temp 97.4°F | Resp 20 | Ht 65.0 in | Wt 195.6 lb

## 2021-06-22 DIAGNOSIS — Z923 Personal history of irradiation: Secondary | ICD-10-CM | POA: Insufficient documentation

## 2021-06-22 DIAGNOSIS — Z85818 Personal history of malignant neoplasm of other sites of lip, oral cavity, and pharynx: Secondary | ICD-10-CM | POA: Diagnosis present

## 2021-06-22 DIAGNOSIS — Z79899 Other long term (current) drug therapy: Secondary | ICD-10-CM | POA: Diagnosis not present

## 2021-06-22 DIAGNOSIS — C07 Malignant neoplasm of parotid gland: Secondary | ICD-10-CM

## 2021-06-22 DIAGNOSIS — F17210 Nicotine dependence, cigarettes, uncomplicated: Secondary | ICD-10-CM | POA: Diagnosis not present

## 2021-06-22 NOTE — Progress Notes (Signed)
Ms. Else presents for follow up of radiation completed 11/06/2017 to her left parotid bed ? ?Pain issues, if any: Patient denies ?Using a feeding tube?: N/A ?Weight changes, if any:  ?Wt Readings from Last 3 Encounters:  ?06/22/21 195 lb 9.6 oz (88.7 kg)  ?05/28/21 198 lb 9.6 oz (90.1 kg)  ?04/05/21 195 lb (88.5 kg)  ? ?Swallowing issues, if any: Patient denies. Reports she is able to eat/drink a wide variety as long as she takes her time and chews everything thoroughly ?Smoking or chewing tobacco? Continues to smoke cigarettes  ?Using fluoride trays daily? N/A--had dental extractions by Dr. Sandi Mariscal on 04/05/2021. Patient reports area has healed well. F/U with Dr. Benson Norway in June ?Last ENT visit was on: Still trying to find a provider that is in network ?Other notable issues, if any: Reports constant fatigue; has difficulty falling asleep and staying asleep. Tried all during the day, and then feels wired after she gets off work (which makes it difficult to fall asleep). Continues to deal dry mouth. Denies any ear or jaw pain, or difficulty opening her mouth. Has been dealing with night sweats for the past 9 months; states she will feel drenched (soaks her bedsheets) and then changes to chilled. Was never started on hormonal therapy after her hysterectomy. TSH last October was normal ? ? ? ? ?

## 2021-06-22 NOTE — Progress Notes (Signed)
?Radiation Oncology         [phone] ?________________________________ ? ?Name: Shelley Mason MRN: [id number]  ?Date: 06/22/2021  DOB: 11-15-1965 ? ?Follow-Up outpatient note ? ?CC: Martyn Malay, MD  Izora Gala, MD ? ?Diagnosis and Prior Radiotherapy:     ?  ICD-10-CM   ?1. Malignant neoplasm of parotid gland (HCC)  C07 Amb Referral to Survivorship Program  ?  ?  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 ? ?Cancer Staging ?Malignant neoplasm of parotid gland ([facility]) ?Staging form: Major Salivary Glands, AJCC 8th Edition ?- Clinical: No stage assigned - Unsigned ?- Pathologic: Stage II (pT2, pN0, cM0) - Signed by Eppie Gibson, MD on 09/17/2017  ? ?Radiation treatment dates:   09/22/2017 to 11/06/2017 ?Site/dose:   The Left parotid bed and distal facial nerve pathway were treated to 64 Gy in 32 fractions of 2 Gy. ? ?CHIEF COMPLAINT:  Here for follow-up and surveillance of left parotid cancer ? ?Narrative:     ? Ms. Rhinehart presents for follow up of radiation completed 11/06/2017 to her left parotid bed ? ?Pain issues, if any: Patient denies ?Using a feeding tube?: N/A ?Weight changes, if any:  ?Wt Readings from Last 3 Encounters:  ?06/22/21 195 lb 9.6 oz (88.7 kg)  ?05/28/21 198 lb 9.6 oz (90.1 kg)  ?04/05/21 195 lb (88.5 kg)  ? ?Swallowing issues, if any: Patient denies. Reports she is able to eat/drink a wide variety as long as she takes her time and chews everything thoroughly ?Smoking or chewing tobacco? Continues to smoke cigarettes  ?Using fluoride trays daily? N/A--had dental extractions by Dr. Sandi Mariscal on 04/05/2021. Patient reports area has healed well. F/U with Dr. Benson Norway in June ?Last ENT visit was on: Still trying to find a provider that is in network ?Other notable issues, if any: Reports constant fatigue; has difficulty falling asleep and staying asleep. Tried all during the day, and  then feels wired after she gets off work (which makes it difficult to fall asleep). Continues to deal dry mouth. Denies any ear or jaw pain, or difficulty opening her mouth. Has been dealing with night sweats for the past 9 months; states she will feel drenched (soaks her bedsheets) and then changes to chilled. Was never started on hormonal therapy after her hysterectomy. TSH last October was normal ? ?She is working as a Biomedical scientist.  She reports that her quality life has improved since dental extractions.  She is very grateful that this was done. ? ? ?ALLERGIES:  is allergic to penicillins and codeine. ? ?Meds: ?Current Outpatient Medications  ?Medication Sig Dispense Refill  ? buPROPion (WELLBUTRIN SR) 150 MG 12 hr tablet TAKE 1 TABLET BY MOUTH TWICE A DAY 180 tablet 2  ? FLUoxetine (PROZAC) 20 MG capsule Take 1 capsule (20 mg total) by mouth daily. 90 capsule 1  ? meclizine (ANTIVERT) 25 MG tablet Take 1 tablet (25 mg total) by mouth daily as needed for dizziness. 30 tablet 0  ? nicotine polacrilex (NICORETTE) 4 MG gum Take 1 each (4 mg total) by mouth as needed for smoking cessation. 100 tablet 0  ? omeprazole (PRILOSEC) 40 MG capsule Take 1 capsule (40 mg total) by mouth daily. 90 capsule 3  ? sucralfate (CARAFATE) 1 g tablet Take 1 tablet (1 g total) by mouth 2 (two) times daily as needed. (Patient not taking: Reported on 04/03/2021) 60 tablet 3  ? triamcinolone ointment (KENALOG) 0.1 %  Apply 1 application. topically 2 (two) times daily as needed (eczema). 453.6 g 1  ? Zoster Vaccine Adjuvanted [facility]) injection Inject 0.5 mLs into the muscle every 2 (two) months for 2 doses. 1 mL 0  ? ?No current facility-administered medications for this encounter.  ? ? ?Physical Findings: ?Wt Readings from Last 3 Encounters:  ?06/22/21 195 lb 9.6 oz (88.7 kg)  ?05/28/21 198 lb 9.6 oz (90.1 kg)  ?04/05/21 195 lb (88.5 kg)  ? ? height is '5\' 5"'$  (1.651 m) and weight is 195 lb 9.6 oz (88.7 kg). Her temperature is 97.4 ?F (36.3 ?C)  (abnormal). Her blood pressure is 136/79 and her pulse is 87. Her respiration is 20 and oxygen saturation is 100%.    ?GEN: alert, oriented, NAD  ?HEENT:  No thrush or tumor in mouth or upper throat.  b/l tympanic membranes are without erythema or drainage.  ?Neck: no adenopathy in periauricular, cervical, or SCV regions ?Heart regular in rate and rhythm ?Chest clear to auscultation bilaterally ? ?Lab Findings: ?Lab Results  ?Component Value Date  ? WBC 8.3 03/06/2021  ? HGB 14.5 03/06/2021  ? HCT 44.0 03/06/2021  ? MCV 91 03/06/2021  ? PLT 298 03/06/2021  ? ?CMP  ?   ?Component Value Date/Time  ? NA 141 07/27/2019 1925  ? NA 144 04/23/2019 1427  ? K 3.5 07/27/2019 1925  ? CL 106 07/27/2019 1925  ? CO2 28 07/27/2019 1925  ? GLUCOSE 96 07/27/2019 1925  ? BUN 12 07/27/2019 1925  ? BUN 9 04/23/2019 1427  ? CREATININE 0.95 07/27/2019 1925  ? CALCIUM 9.1 07/27/2019 1925  ? PROT 7.2 04/23/2019 1427  ? ALBUMIN 4.1 04/23/2019 1427  ? AST 14 04/23/2019 1427  ? ALT 10 04/23/2019 1427  ? ALKPHOS 84 04/23/2019 1427  ? BILITOT <0.2 04/23/2019 1427  ? GFRNONAA >60 07/27/2019 1925  ? GFRAA >60 07/27/2019 1925  ? ? ?Lab Results  ?Component Value Date  ? TSH 0.771 12/22/2020  ? ? ?Radiographic Findings: ?No results found. ? ? ?Impression/Plan:   ? ?1) Head and Neck Cancer Status: She remains without evidence of disease.  ? ?2) Nutritional Status: Stable  ?PEG tube: No ? ?3) Risk Factors: The patient has been educated about risk factors including alcohol and tobacco abuse; she understands that avoidance of alcohol and tobacco is important to prevent recurrences as well as other cancers.  She continues to smoke. ? ?4) Swallowing: Functional. ? ?5) Thyroid function: WNL. Unlikely to be affected by ipsilateral RT but this can be followed yearly as a precaution ?Lab Results  ?Component Value Date  ? TSH 0.771 12/22/2020  ? ?6)  She has coped with vertigo since her surgery and radiation and has tried to apply for disability without  success.  She is now working as a Biomedical scientist.  I applauded her on her resilience. ? ?7) I consider her cured.  We will refer her to survivorship clinic in 12moand I will see her as needed. ? ?On date of service, in total, I spent 20 minutes on this encounter. Patient was seen in person. ? ?_____________________________________ ? ? ?SEppie Gibson MD  ?

## 2021-06-27 ENCOUNTER — Ambulatory Visit: Payer: 59 | Admitting: Licensed Clinical Social Worker

## 2021-06-27 NOTE — Chronic Care Management (AMB) (Signed)
?  Care Management  ? ?Social Work Visit Note ? ?06/27/2021 ?Name: Shelley Mason MRN: [id number] DOB: 10-24-1965 ? ?Shelley Mason is a 56 y.o. year old female who sees Martyn Malay, MD for primary care. The care management team was consulted for assistance with care management and care coordination needs related to  Initial visit.   ? ?Patient was given the following information about care management and care coordination services today, agreed to services, and gave verbal consent: 1.care management/care coordination services include personalized support from designated clinical staff supervised by their physician, including individualized plan of care and coordination with other care providers 2. 24/7 contact phone numbers for assistance for urgent and routine care needs. 3. The patient may stop care management/care coordination services at any time by phone call to the office staff. ? ?Engaged with patient by telephone for initial visit in response to provider referral for social work chronic care management and care coordination services. ? ?Assessment: Review of patient history, allergies, and health status during evaluation of patient need for care management/care coordination services.   ? ?Interventions:  ?Patient interviewed and appropriate assessments performed ?Collaborated with clinical team regarding patient needs  ?Patient has concerns regarding her live in boyfriend and substance abuse. SW confirm patient received resources from PCP. SW offered additional resources including [employer]. Patient stated she is aware. Patient understands boyfriend has to be willing to participate in programs. Patient advised her boyfriend is on probation.  ?SW inquired on patients mental health and if she is seeing a therapist regarding her emotional stress. Patient advised no, but she would like to. SW referred patient to [employer] benefits and available counselors in network. Patient  advised she would outreach counselors.  ?SW completed SDOH screening. Patient confirmed she received [employer] benefits, has food, has transportation and housing. Patient has a significant support systems and has a great relationship with family. Patient has no children in the home and is not a caretaker for anyone.  ? ?SDOH (Social Determinants of Health) assessments performed: Yes ?SDOH Interventions   ? ?Flowsheet Row Most Recent Value  ?SDOH Interventions   ?Stress Interventions Provide Counseling  ? ?  ?   ? ?Plan:  ?No further assistance is needed per patient.  ? ?Milus Height, BSW , MSW, LCSW-A ?Social Worker ?[facility] Care Management  ?[phone] ?. ? ? ? ? ? ? ? ? ? ? ? ? ? ? ?

## 2021-06-27 NOTE — Patient Instructions (Signed)
Visit Information ? ?Instructions:  ? ?Patient was given the following information about care management and care coordination services today, agreed to services, and gave verbal consent: 1.care management/care coordination services include personalized support from designated clinical staff supervised by their physician, including individualized plan of care and coordination with other care providers 2. 24/7 contact phone numbers for assistance for urgent and routine care needs. 3. The patient may stop care management/care coordination services at any time by phone call to the office staff. ? ?Patient verbalizes understanding of instructions and care plan provided today and agrees to view in [place]. Active MyChart status confirmed with patient.   ? ?No further follow up required: Per patient request. ? ?Milus Height, BSW  ?Social Worker ?[facility] Care Management  ?[phone] ?  ? ?  ?

## 2021-07-05 ENCOUNTER — Ambulatory Visit (HOSPITAL_COMMUNITY)
Admission: RE | Admit: 2021-07-05 | Discharge: 2021-07-05 | Disposition: A | Discharge status: Home / Self Care | Payer: 59 | Source: Ambulatory Visit | Attending: Family Medicine | Admitting: Family Medicine

## 2021-07-05 DIAGNOSIS — F17210 Nicotine dependence, cigarettes, uncomplicated: Secondary | ICD-10-CM | POA: Diagnosis present

## 2021-07-06 ENCOUNTER — Telehealth: Payer: Self-pay | Admitting: Family Medicine

## 2021-07-06 NOTE — Telephone Encounter (Signed)
Called with results. Discussed. Repeat 1 year. Encouraged smoking cessation. Discussed statin, will consider starting at follow up. ?Shelley Singh, MD  ?Family Medicine Teaching Service  ? ?

## 2021-08-03 ENCOUNTER — Other Ambulatory Visit (HOSPITAL_COMMUNITY): Payer: 59 | Admitting: Dentistry

## 2021-08-07 ENCOUNTER — Encounter: Payer: Self-pay | Admitting: *Deleted

## 2021-08-10 ENCOUNTER — Encounter (HOSPITAL_COMMUNITY): Payer: Self-pay | Admitting: Dentistry

## 2021-08-10 ENCOUNTER — Ambulatory Visit (INDEPENDENT_AMBULATORY_CARE_PROVIDER_SITE_OTHER): Payer: Self-pay | Admitting: Dentistry

## 2021-08-10 VITALS — BP 126/74 | HR 76 | Temp 98.2°F

## 2021-08-10 DIAGNOSIS — Z923 Personal history of irradiation: Secondary | ICD-10-CM

## 2021-08-10 DIAGNOSIS — Z0120 Encounter for dental examination and cleaning without abnormal findings: Secondary | ICD-10-CM

## 2021-08-10 DIAGNOSIS — K08109 Complete loss of teeth, unspecified cause, unspecified class: Secondary | ICD-10-CM | POA: Diagnosis not present

## 2021-08-10 DIAGNOSIS — Z85818 Personal history of malignant neoplasm of other sites of lip, oral cavity, and pharynx: Secondary | ICD-10-CM | POA: Diagnosis not present

## 2021-08-10 DIAGNOSIS — M270 Developmental disorders of jaws: Secondary | ICD-10-CM

## 2021-08-10 DIAGNOSIS — K117 Disturbances of salivary secretion: Secondary | ICD-10-CM

## 2021-08-10 DIAGNOSIS — K0850 Unsatisfactory restoration of tooth, unspecified: Secondary | ICD-10-CM

## 2021-08-10 DIAGNOSIS — K029 Dental caries, unspecified: Secondary | ICD-10-CM

## 2021-08-10 DIAGNOSIS — K06020 Generalized gingival recession, unspecified: Secondary | ICD-10-CM

## 2021-08-10 DIAGNOSIS — K0889 Other specified disorders of teeth and supporting structures: Secondary | ICD-10-CM

## 2021-08-10 DIAGNOSIS — Y842 Radiological procedure and radiotherapy as the cause of abnormal reaction of the patient, or of later complication, without mention of misadventure at the time of the procedure: Secondary | ICD-10-CM

## 2021-08-10 DIAGNOSIS — K030 Excessive attrition of teeth: Secondary | ICD-10-CM

## 2021-08-10 DIAGNOSIS — M2632 Excessive spacing of fully erupted teeth: Secondary | ICD-10-CM

## 2021-08-10 DIAGNOSIS — K036 Deposits [accretions] on teeth: Secondary | ICD-10-CM

## 2021-08-10 DIAGNOSIS — K056 Periodontal disease, unspecified: Secondary | ICD-10-CM

## 2021-08-10 NOTE — Progress Notes (Signed)
Department of Dental Medicine   Service Date:   08/10/2021  Patient Name:  Shelley Mason Date of Birth:   26-May-1965 Medical Record Number: [id number]  Referring Provider:           Eppie Gibson, M.D.  TODAY'S VISIT: COMPREHENSIVE EXAM   EXAM: Soft tissue:  WNL Caries risk:  HIGH Periodontal impression:  Chronic periodontitis, accretions on teeth Other findings:  Dry mucous membranes  PLAN:  After a discussion & exploring various treatment options, the following treatment plan was finalized: ScRP all 4 quadrants Restorative #9 extraction P/P fabrication Recall interval:  Radiographs- 6 mos, periodontal maintenance- 4-6 mos  Next visit:  ScRP UR+LR   08/10/2021   HISTORY OF PRESENT ILLNESS: Shelley Mason is a very pleasant 56 y.o. female with h/o parotid gland cancer s/p left parotidectomy (2019) and radiation (from 09/22/17-11/06/17),  anemia, anxiety, GERD, tobacco use (previously quit, now current user- cigarettes), depression and OSA who presents today for a comprehensive dental exam.   DENTAL HISTORY: The patient is a patient of record at the hospital dental clinic. She was last seen for multiple extractions and full mouth debridement in the operating room in 04/2021. Postoperatively she healed well following extractions. She is unsure if her dental insurance has been approved yet or not and plans on following up with this after today's dental visit and prior to scheduling her next appointment.  She currently denies any dental/orofacial pain or sensitivity. Patient is able to manage oral secretions.  Patient denies dysphagia, odynophagia, dysphonia, SOB and neck pain.  Patient denies fever, rigors and malaise.   CHIEF COMPLAINT: Here for a comprehensive dental exam; patient with no complaints.   Patient Active Problem List   Diagnosis Date Noted   History of radiation to head and neck region 03/10/2021   Healthcare maintenance 09/20/2019   Vestibular disorder  08/16/2019   Gastroesophageal reflux disease 04/23/2019   Obesity (BMI 35.0-39.9 without comorbidity) 04/23/2019   Anxiety 02/16/2019   Vertigo 11/19/2018   Sensorineural hearing loss (SNHL) of left ear with unrestricted hearing of right ear 10/28/2017   History of malignant neoplasm of parotid gland 09/17/2017   Cigarette smoker 06/21/2015   Past Medical History:  Diagnosis Date   Anemia    history of   Anxiety    Cigarette smoker    Depression    Fibroids    history of uterine fibroids   GERD (gastroesophageal reflux disease)    Hearing loss    Hearing loss    History of blood transfusion    History of radiation therapy 09/22/17- 11/06/17   tumor bed left neck with added margin o facial nerve.    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(HCC)    Vertigo    Past Surgical History:  Procedure Laterality Date   CESAREAN SECTION     x 3   COLONOSCOPY     CYSTOSCOPY     LAPAROSCOPIC ASSISTED VAGINAL HYSTERECTOMY     MULTIPLE EXTRACTIONS WITH ALVEOLOPLASTY N/A 04/05/2021   Procedure: MULTIPLE EXTRACTION WITH ALVEOLOPLASTY;  Surgeon: Charlaine Dalton, DMD;  Location: [facility];  Service: Dentistry;  Laterality: N/A;   PAROTIDECTOMY Left    TUBAL LIGATION     Allergies  Allergen Reactions   Penicillins Anaphylaxis and Rash    Has patient had a PCN reaction causing immediate rash, facial/tongue/throat swelling, SOB or lightheadedness with hypotension: Yes Has patient had a  PCN reaction causing severe rash involving mucus membranes or skin necrosis: Yes Has patient had a PCN reaction that required hospitalization: Yes Has patient had a PCN reaction occurring within the last 10 years: No If all of the above answers are "NO", then may proceed with Cephalosporin use.    Codeine Rash   Current Outpatient Medications  Medication Sig Dispense Refill   buPROPion (WELLBUTRIN SR) 150 MG 12 hr tablet TAKE 1 TABLET BY MOUTH TWICE A DAY 180  tablet 2   FLUoxetine (PROZAC) 20 MG capsule Take 1 capsule (20 mg total) by mouth daily. 90 capsule 1   meclizine (ANTIVERT) 25 MG tablet Take 1 tablet (25 mg total) by mouth daily as needed for dizziness. 30 tablet 0   nicotine polacrilex (NICORETTE) 4 MG gum Take 1 each (4 mg total) by mouth as needed for smoking cessation. 100 tablet 0   omeprazole (PRILOSEC) 40 MG capsule Take 1 capsule (40 mg total) by mouth daily. 90 capsule 3   sucralfate (CARAFATE) 1 g tablet Take 1 tablet (1 g total) by mouth 2 (two) times daily as needed. (Patient not taking: Reported on 04/03/2021) 60 tablet 3   triamcinolone ointment (KENALOG) 0.1 % Apply 1 application. topically 2 (two) times daily as needed (eczema). 453.6 g 1   No current facility-administered medications for this visit.    LABS: Lab Results  Component Value Date   WBC 8.3 03/06/2021   HGB 14.5 03/06/2021   HCT 44.0 03/06/2021   MCV 91 03/06/2021   PLT 298 03/06/2021      Component Value Date/Time   NA 141 07/27/2019 1925   NA 144 04/23/2019 1427   K 3.5 07/27/2019 1925   CL 106 07/27/2019 1925   CO2 28 07/27/2019 1925   GLUCOSE 96 07/27/2019 1925   BUN 12 07/27/2019 1925   BUN 9 04/23/2019 1427   CREATININE 0.95 07/27/2019 1925   CALCIUM 9.1 07/27/2019 1925   GFRNONAA >60 07/27/2019 1925   GFRAA >60 07/27/2019 1925   No results found for: "INR", "PROTIME" No results found for: "PTT"  Social History   Socioeconomic History   Marital status: Divorced    Spouse name: Not on file   Number of children: 3   Years of education: Not on file   Highest education level: Not on file  Occupational History    Employer: [employer]  Tobacco Use   Smoking status: Every Day    Packs/day: 1.00    Years: 21.00    Total pack years: 21.00    Types: Cigarettes    Start date: 06/20/2000   Smokeless tobacco: Never   Tobacco comments:    She had quit, but restarted due to stress.   Vaping Use   Vaping Use: Never used  Substance and  Sexual Activity   Alcohol use: Not Currently    Alcohol/week: 0.0 standard drinks of alcohol    Comment: occasionally   Drug use: No   Sexual activity: Yes    Birth control/protection: None, Surgical  Other Topics Concern   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      If she couldn't take --> Nettie Bullard would make decisions       Smokes cigarettes    Social Determinants of Health   Financial Resource Strain: Not on file  Food Insecurity: No Food Insecurity (06/27/2021)  Hunger Vital Sign    Worried About Running Out of Food in the Last Year: Never true    Ran Out of Food in the Last Year: Never true  Transportation Needs: No Transportation Needs (06/27/2021)   PRAPARE - Hydrologist (Medical): No    Lack of Transportation (Non-Medical): No  Physical Activity: Not on file  Stress: Stress Concern Present (06/27/2021)   [place]    Feeling of Stress : Very much  Social Connections: Not on file  Intimate Partner Violence: Not At Risk (06/27/2021)   Humiliation, Afraid, Rape, and Kick questionnaire    Fear of Current or Ex-Partner: No    Emotionally Abused: No    Physically Abused: No    Sexually Abused: No   Family History  Problem Relation Age of Onset   Hyperlipidemia Mother    Hypertension Mother    Heart failure Mother    Depression Mother    Alcohol abuse Mother    Prostate cancer Father    Alcohol abuse Father    Hyperlipidemia Sister    Heart disease Other        family history   Diabetes Other        family history   Hypertension Other        family history   Colon cancer Neg Hx    Colon polyps Neg Hx    Esophageal cancer Neg Hx    Rectal cancer Neg Hx    Stomach cancer Neg Hx      REVIEW OF SYSTEMS:  Reviewed with the patient as per HPI. Psych: Patient denies  having dental phobia.   VITAL SIGNS: BP 126/74 (BP Location: Right Arm, Patient Position: Sitting, Cuff Size: Normal)   Pulse 76   Temp 98.2 F (36.8 C) (Oral)    PHYSICAL EXAM:  Soft tissue exam completed and charted.  General:  Well-developed, comfortable and in no apparent distress. Neurological:  Alert and oriented to person, place and  time. Extraoral:  Head size, head shape, facial symmetry, skin, complexion, pigmentation, conjunctiva, oral labia, parotid gland, submandibular gland, thyroid, cervical and preauricular lymph nodes, submandibular and submental lymph nodes, tonsillar and occipital lymph nodes and supraclavicular lymph nodes WNL. No swelling or lymphadenopathy.  TMJ asymptomatic without clicks or crepitations. Intraoral:  Soft tissues appear well-perfused and mucous membranes moist.  FOM and vestibules soft and not raised. Oral cavity without mass or lesion. No signs of infection, parulis, sinus tract, edema or erythema evident upon exam.  Small palatal tori. (+) Mucous membranes:  Dry; thick, viscous saliva   DENTAL EXAM:  Hard tissue and periodontal exam completed and charted.     Overall impression:  Fair remaining dentition.    Oral hygiene:  Poor      HARD TISSUE:     Missing:  #1, #2, #3, #13, #14, #16, #17, #18, #19, #28, #30, #31, #32 Caries:  Restorable:  #4, #5, #11, #12, #20, #21, #22, #29   Incipient lesions:  #35F(V), #77M, #37F(V)  Defective Restorations:  #9 core-build up with recurrent decay under margins  Endodontics:   #9 previous RCT without definitive restoration  Occlusion:  Unable to assess molar occlusion  Non-functional teeth:  #5, #15, #20   Supra-erupted teeth:  #5, #20  Other: drifting- #29 towards mesial; #23DI has small chip/fracture limited to enamel Other findings:   (+) Attrition/wear: #6 - #11 incisal; #23 - #27 incisal (+) Diastema(s): #  6#7, #7#8, #8#9, #9#10, #10#11; #20#21, #21#22, #22#23, #23#24,  #24#25, #25#26, #26#27      PERIODONTAL:    Probing depths:  Range between 1-10 mm; 8-10 mm pocket depths localized to tooth #9 & adjacent  Plaque:  Slight, localized Calculus:  Generalized accumulation Mobility:  Class I -  #12 Periodontally compromised teeth:  #9 hopeless prognosis with 10 mm probing depths on distal (there is likely a vertical root fracture) Gingival appearance:  Pink, healthy gingival tissue with blunted papilla with areas of inflamed, erythematous gingival tissue.  Generalized bleeding on probing.   Exam performed by [facility] B. Benson Norway, D.M.D. with Leta Speller, DAII acting as scribe.   RADIOGRAPHIC EXAM:  None taken at today's visit   ASSESSMENT:  1. History of head and cancer  2. History of radiation to the head and neck 3. New patient dental exam 4.  Missing teeth 5.  Caries 6.  Accretions on teeth 7.  Xerostomia due to radiotherapy 8.  Periodontal disease 9.  Incipient carious lesions 10.  Attrition/wear 11.  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  Loose teeth 14.  Gingival recession, generalized 15.  Defective restoration   PLAN/RECOMMENDATIONS: I discussed the risks, benefits, and complications of various scenarios with the patient in relationship to their medical and dental conditions.  I explained all significant findings of the dental consultation with the patient including: Periodontal disease and recommend deep cleaning of all 4 quadrants (whole mouth) due to deep probings, ++bleeding upon probing and calculus or tartar build-up underneath her gums.  I also discussed the >10 mm pocket depths taken on tooth #9 and the recommended treatment being extraction.  I discussed that isolated 10 mm probing on this tooth indicated vertical root fracture, and since the tooth has recurrent decay and likely reinfection of existing root canal, extraction of tooth would be recommended and we can always add tooth #9 onto future partial denture if she  desires. Several cavities and recommended fillings on these teeth.  I also discussed incipient lesions and that these will be monitored at future recall visits. The patient verbalized understanding of all findings, discussion, and recommendations. We then discussed various treatment options to include no treatment, multiple extractions with alveoloplasty, pre-prosthetic surgery as indicated, periodontal therapy, dental restorations, root canal therapy, crown and bridge therapy, implant therapy, and replacement of missing teeth as indicated.  The patient verbalized understanding of all options, and currently wishes to proceed with the treatment plan below:  ScRP all 4 quadrants Restorative Extraction of tooth #9 Upper and lower RPD fabrication Recall interval: Periodontal maintenance- 4-6 mos, Bitewings/update radiographs- 6 mos  Next visit:  ScRP UR/LR The patient wishes to call the dental clinic once she verifies her dental insurance has been approved.  Next visit was not scheduled today.   All questions and concerns were invited and addressed.  The patient tolerated today's visit well and departed in stable condition.    I spent in excess of 120 minutes during the conduct of this consultation and >50% of this time involved direct face-to-face encounter for counseling and/or coordination of the patient's care.     Charlaine Dalton, D.M.D.

## 2021-08-18 ENCOUNTER — Other Ambulatory Visit: Payer: Self-pay | Admitting: Family Medicine

## 2021-08-20 DIAGNOSIS — Y842 Radiological procedure and radiotherapy as the cause of abnormal reaction of the patient, or of later complication, without mention of misadventure at the time of the procedure: Secondary | ICD-10-CM | POA: Insufficient documentation

## 2021-08-20 DIAGNOSIS — K029 Dental caries, unspecified: Secondary | ICD-10-CM | POA: Insufficient documentation

## 2021-08-20 DIAGNOSIS — K06020 Generalized gingival recession, unspecified: Secondary | ICD-10-CM | POA: Insufficient documentation

## 2021-09-03 ENCOUNTER — Telehealth: Payer: Self-pay | Admitting: Nurse Practitioner

## 2021-09-03 NOTE — Telephone Encounter (Signed)
.  Called patient to schedule appointment per 5/3 inbasket, patient is aware of date and time.   ?

## 2021-09-10 ENCOUNTER — Ambulatory Visit: Payer: 59

## 2021-09-17 ENCOUNTER — Ambulatory Visit
Admission: RE | Admit: 2021-09-17 | Discharge: 2021-09-17 | Disposition: A | Discharge status: Home / Self Care | Payer: Medicaid Other | Source: Ambulatory Visit | Attending: Family Medicine | Admitting: Family Medicine

## 2021-09-17 DIAGNOSIS — Z1239 Encounter for other screening for malignant neoplasm of breast: Secondary | ICD-10-CM

## 2021-09-18 ENCOUNTER — Encounter: Payer: Self-pay | Admitting: Family Medicine

## 2021-12-02 ENCOUNTER — Encounter: Payer: Self-pay | Admitting: Family Medicine

## 2021-12-03 ENCOUNTER — Other Ambulatory Visit: Payer: Self-pay | Admitting: Family Medicine

## 2021-12-03 DIAGNOSIS — Z720 Tobacco use: Secondary | ICD-10-CM

## 2021-12-04 ENCOUNTER — Other Ambulatory Visit: Payer: Self-pay

## 2021-12-04 DIAGNOSIS — Z1329 Encounter for screening for other suspected endocrine disorder: Secondary | ICD-10-CM

## 2021-12-04 MED ORDER — NICOTINE POLACRILEX 4 MG MT GUM: 4.0000 mg | CHEWING_GUM | OROMUCOSAL | 0 refills | Status: DC | PRN

## 2021-12-10 ENCOUNTER — Ambulatory Visit: Payer: Commercial Managed Care - HMO | Attending: Family Medicine

## 2021-12-10 ENCOUNTER — Inpatient Hospital Stay: Payer: 59 | Admitting: Nurse Practitioner

## 2021-12-14 ENCOUNTER — Encounter: Payer: Self-pay | Admitting: Family Medicine

## 2021-12-14 ENCOUNTER — Ambulatory Visit (INDEPENDENT_AMBULATORY_CARE_PROVIDER_SITE_OTHER): Payer: Commercial Managed Care - HMO | Admitting: Family Medicine

## 2021-12-14 VITALS — BP 128/70 | HR 78 | Ht 64.0 in | Wt 189.4 lb

## 2021-12-14 DIAGNOSIS — F419 Anxiety disorder, unspecified: Secondary | ICD-10-CM

## 2021-12-14 DIAGNOSIS — K219 Gastro-esophageal reflux disease without esophagitis: Secondary | ICD-10-CM | POA: Diagnosis not present

## 2021-12-14 DIAGNOSIS — E669 Obesity, unspecified: Secondary | ICD-10-CM

## 2021-12-14 DIAGNOSIS — R6889 Other general symptoms and signs: Secondary | ICD-10-CM

## 2021-12-14 DIAGNOSIS — F17210 Nicotine dependence, cigarettes, uncomplicated: Secondary | ICD-10-CM

## 2021-12-14 DIAGNOSIS — Z23 Encounter for immunization: Secondary | ICD-10-CM | POA: Diagnosis not present

## 2021-12-14 DIAGNOSIS — R1310 Dysphagia, unspecified: Secondary | ICD-10-CM

## 2021-12-14 MED ORDER — CITALOPRAM HYDROBROMIDE 10 MG PO TABS: 10.0000 mg | ORAL_TABLET | Freq: Every day | ORAL | 1 refills | Status: DC

## 2021-12-14 MED ORDER — SHINGRIX 50 MCG/0.5ML IM SUSR: 0.5000 mL | INTRAMUSCULAR | 0 refills | Status: AC

## 2021-12-14 MED ORDER — MECLIZINE HCL 25 MG PO TABS: 25.0000 mg | ORAL_TABLET | Freq: Every day | ORAL | 0 refills | Status: DC | PRN

## 2021-12-14 NOTE — Assessment & Plan Note (Signed)
A1C and LDL today

## 2021-12-14 NOTE — Assessment & Plan Note (Signed)
Discussed cessation 

## 2021-12-14 NOTE — Progress Notes (Signed)
    SUBJECTIVE:   CHIEF COMPLAINT: anxiety  HPI:   Shelley Mason is a 56 y.o.  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   She reports overall she is doing okay. She is struggling with side effects of dental procedure + prior radiation. Taste and olfaction after radiation have not returned--many of her favorite foods (broccoli and watermelon) do not taste the same. She has difficulty chewing as now has only 12 teeth. She has had slow weight loss this year--she thinks related to poor appetite and not wanting to eat due to dental issues. Endorses new odynophagia to solids and liquids over the past 3 weeks. No sick symptoms. She feels this moreso on L as compared to R. She has been released from Dr. Isidore Moos (Rad-onc) and is now in a survivorship plan (diagnosed 2019).   The patient reports worsening anxiety. She thinks the holidays are the cause--thinking about her parents. Manifests as poor sleep, poor appetite, overwhelming thoughts. No SI/HI. She is not taking Prozac. She has tried therapy and is not interested at this time. She is interested in restarting Citalopram which helped some in the past. 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 No other new stressors.    PERTINENT  PMH / PSH/Family/Social History : tobacco use, obesity, anxiety   OBJECTIVE:   BP 128/70   Pulse 78   Ht '5\' 4"'$  (1.626 m)   Wt 189 lb 6.4 oz (85.9 kg)   SpO2 99%   BMI 32.51 kg/m   Today's weight:  Last Weight  Most recent update: 12/14/2021  8:32 AM    Weight  85.9 kg (189 lb 6.4 oz)            Review of prior weights: [employer]   12/14/21 0832  Weight: 189 lb 6.4 oz (85.9 kg)   HEENT Surgically absent L parotid No masses or LAD Oropharynx with poor dentition  Cardiac: Regular rate and rhythm. Normal S1/S2. No murmurs, rubs, or gallops appreciated. Lungs: Clear bilaterally to ascultation.  Abdomen:  Normoactive bowel sounds. No tenderness to deep or light palpation. No rebound or guarding.  Psych: Pleasant and appropriate    ASSESSMENT/PLAN:   Obesity (BMI 35.0-39.9 without comorbidity) A1C and LDL today   Cigarette smoker Discussed cessation    Odynophagia, new, given smoking history and prior RT, refer to ENT for further evaluation.   Weight change suspect due to change in appetite, UTD on colonoscopy and mammogram. Had low dose CT in May, showed aortic atherosclerosis.  Obtain CBC, BMP today. TSH ordered. Follow up 1 month to monitor and discuss imaging (neck).   Anxiety, offered therapy, discussed options, restart Citalopram.   HCM Flu and Shingrix Rx--declined flu as she has to go to work today--discussed importance, benefits and limited side effects    Dorris Singh, MD  [place] [facility]

## 2021-12-14 NOTE — Patient Instructions (Addendum)
It was wonderful to see you today.  Please bring ALL of your medications with you to every visit.   Today we talked about:  - Start Citalopram 10 mg   - Please follow up in 1 month to see how it is working--we will increase the dose  - I will message you with blood work results  I have referred you to ENT to further evaluate your concern. If you do not received a phone call about this appointment within 2 weeks, please call our office back at [phone]. Jazmin Hartsell coordinates our referrals and can assist you in this.   For your foot--you can use tape--let me know if this does not improve   You can get your shingles vaccine at the pharmacy   GREAT WORK on cutting down on smoking  Tobacco use is damaging to your body. It increases your risk of stroke, heart attack, lung cancer, and serious lung disease in the future. It also reduces your fertility.   Quitting tobacco is the best thing for your health but is a challenge---nicotine, a chemical in cigarettes, is highly addictive.   You can call 1 800 QUIT NOW (1-564-041-9656)---you will be connected with a Artist. They can also mail you nicotine gums, lozenges, and patches to quit.   Ask me about patches (which you wear all day) and gums (which you use when you have a craving) to help you quit.   There are safe, effective medications to help you quit--  Varencline---also called Chantix---- is the most common medication used to help people stop smoking. It starts a low dose and is increased. I recommended choosing a quit date then starting the medication 8 days before this. Side effects include mild headache, difficulty sleeping, and odd dreams. The medication is typically very well tolerated.     Bupropion---also called Zyban---- is started 1 week before your quit date. You take 1 pill for three days then increase to 1 pill twice per day. Side effects include a mild headache and anxiety---this usually goes away. Some  patients experience weight loss.     Please follow up in 1 months   Thank you for choosing [facility].   Please call [phone] with any questions about today's appointment.  Please be sure to schedule follow up at the front  desk before you leave today.   Dorris Singh, MD  Family Medicine

## 2021-12-15 LAB — CBC
Hematocrit: 39.3 % (ref 34.0–46.6)
Hemoglobin: 12.6 g/dL (ref 11.1–15.9)
MCH: 29.7 pg (ref 26.6–33.0)
MCHC: 32.1 g/dL (ref 31.5–35.7)
MCV: 93 fL (ref 79–97)
Platelets: 246 10*3/uL (ref 150–450)
RBC: 4.24 x10E6/uL (ref 3.77–5.28)
RDW: 12.8 % (ref 11.7–15.4)
WBC: 6.6 10*3/uL (ref 3.4–10.8)

## 2021-12-15 LAB — BASIC METABOLIC PANEL
BUN/Creatinine Ratio: 16 (ref 9–23)
BUN: 13 mg/dL (ref 6–24)
CO2: 22 mmol/L (ref 20–29)
Calcium: 9.3 mg/dL (ref 8.7–10.2)
Chloride: 103 mmol/L (ref 96–106)
Creatinine, Ser: 0.79 mg/dL (ref 0.57–1.00)
Glucose: 71 mg/dL (ref 70–99)
Potassium: 4.8 mmol/L (ref 3.5–5.2)
Sodium: 139 mmol/L (ref 134–144)
eGFR: 89 mL/min/{1.73_m2} (ref >59)

## 2021-12-15 LAB — HEMOGLOBIN A1C
Est. average glucose Bld gHb Est-mCnc: 120 mg/dL
Hgb A1c MFr Bld: 5.8 % — ABNORMAL HIGH (ref 4.8–5.6)

## 2021-12-15 LAB — FERRITIN: Ferritin: 184 ng/mL — ABNORMAL HIGH (ref 15–150)

## 2021-12-15 LAB — LDL CHOLESTEROL, DIRECT: LDL Direct: 144 mg/dL — ABNORMAL HIGH (ref 0–99)

## 2021-12-17 ENCOUNTER — Telehealth: Payer: Self-pay | Admitting: Family Medicine

## 2021-12-17 DIAGNOSIS — E785 Hyperlipidemia, unspecified: Secondary | ICD-10-CM

## 2021-12-17 MED ORDER — ATORVASTATIN CALCIUM 20 MG PO TABS: 20.0000 mg | ORAL_TABLET | Freq: Every day | ORAL | 3 refills | Status: AC

## 2021-12-17 NOTE — Telephone Encounter (Signed)
Called and discussed results. No anemia, iron elevated, stop PO iron. ASCVD is 6%, has family history of CAD (mother).  Will start statin. Discussed smoking cessation.  Dorris Singh, MD  Family Medicine Teaching Service

## 2021-12-21 LAB — SPECIMEN STATUS REPORT

## 2021-12-21 LAB — TSH: TSH: 0.982 u[IU]/mL (ref 0.450–4.500)

## 2021-12-26 ENCOUNTER — Encounter: Payer: Self-pay | Admitting: Nurse Practitioner

## 2021-12-26 ENCOUNTER — Inpatient Hospital Stay: Payer: Commercial Managed Care - HMO | Attending: Nurse Practitioner | Admitting: Nurse Practitioner

## 2021-12-26 VITALS — BP 132/82 | HR 86 | Temp 98.1°F | Resp 16 | Wt 190.9 lb

## 2021-12-26 DIAGNOSIS — Z923 Personal history of irradiation: Secondary | ICD-10-CM | POA: Diagnosis not present

## 2021-12-26 DIAGNOSIS — Z79899 Other long term (current) drug therapy: Secondary | ICD-10-CM | POA: Insufficient documentation

## 2021-12-26 DIAGNOSIS — F17210 Nicotine dependence, cigarettes, uncomplicated: Secondary | ICD-10-CM | POA: Insufficient documentation

## 2021-12-26 DIAGNOSIS — J029 Acute pharyngitis, unspecified: Secondary | ICD-10-CM | POA: Insufficient documentation

## 2021-12-26 DIAGNOSIS — H9312 Tinnitus, left ear: Secondary | ICD-10-CM | POA: Diagnosis not present

## 2021-12-26 DIAGNOSIS — Z85818 Personal history of malignant neoplasm of other sites of lip, oral cavity, and pharynx: Secondary | ICD-10-CM | POA: Diagnosis not present

## 2021-12-26 NOTE — Progress Notes (Signed)
CLINIC:  Survivorship  Patient Care Team: Martyn Malay, MD as PCP - General (Family Medicine) Drema Pry as Social Worker   REASON FOR VISIT:  Routine follow-up for history of head & neck cancer.  BRIEF ONCOLOGIC HISTORY:  Oncology History  Malignant neoplasm of parotid gland ([place]) (Resolved)  09/17/2017 Initial Diagnosis   Malignant neoplasm of parotid gland ([place])   09/17/2017 Cancer Staging   Staging form: Major Salivary Glands, AJCC 8th Edition - Pathologic: Stage II (pT2, pN0, cM0) - Signed by Eppie Gibson, MD on 09/17/2017    Radiation treatment dates:   09/22/2017 to 11/06/2017 Site/dose:   The Left parotid bed and distal facial nerve pathway were treated to 64 Gy in 32 fractions of 2 Gy.  INTERVAL HISTORY: Ms. Foos presents for follow-up as scheduled, last seen by Dr. Isidore Moos 06/22/2021 and discharged.  She has been referred to [facility] but has not been scheduled yet.  She has not seen dentist since earlier this year due to finding for Medicaid and disability.  She continues to have dry mouth.  Denies trouble swallowing or pain.  She is down to smoking 5 cigarettes daily on medication, drinks alcohol occasionally.  He walks daily and is very active, tries to take good care of her health.  In the past 2 weeks she has new sore throat and burning when she eats.  This sensation has returned since completing radiation years ago and she feels some firmness.  She notes people at work have had strep.  She has reflux, takes Prilosec consistently but not sucralfate lately.  She continues to deal with left ear tinnitus and vertigo which also causes hoarseness.   -Pain: None -Nutrition/Diet: Normal -Dysphagia?:  None -Dental issues?: using fluoride trays?  Last visit 04/2021, still needs 1 tooth pulled.  Cannot see until she gets Medicaid -Last TSH: 12/14/2021, normal -Weight: (LOSS/GAIN) overall stable  -Last ENT visit: 06/03/2019 Dr. Melony Overly, trying to get  reestablished -Last Rad Onc visit: 06/2021 Dr. Isidore Moos -Last Dentist visit: 04/2021 Dr. Benson Norway    ADDITIONAL REVIEW OF SYSTEMS:  ROS    CURRENT MEDICATIONS:  Current Outpatient Medications on File Prior to Visit  Medication Sig Dispense Refill   atorvastatin (LIPITOR) 20 MG tablet Take 1 tablet (20 mg total) by mouth at bedtime. 90 tablet 3   buPROPion (WELLBUTRIN SR) 150 MG 12 hr tablet TAKE 1 TABLET BY MOUTH TWICE A DAY 180 tablet 2   citalopram (CELEXA) 10 MG tablet Take 1 tablet (10 mg total) by mouth daily. 30 tablet 1   meclizine (ANTIVERT) 25 MG tablet Take 1 tablet (25 mg total) by mouth daily as needed for dizziness. 30 tablet 0   nicotine polacrilex (NICORETTE) 4 MG gum Take 1 each (4 mg total) by mouth as needed for smoking cessation. 100 tablet 0   omeprazole (PRILOSEC) 40 MG capsule Take 1 capsule (40 mg total) by mouth daily. 90 capsule 3   sucralfate (CARAFATE) 1 g tablet Take 1 tablet (1 g total) by mouth 2 (two) times daily as needed. (Patient not taking: Reported on 04/03/2021) 60 tablet 3   triamcinolone ointment (KENALOG) 0.1 % Apply 1 application. topically 2 (two) times daily as needed (eczema). 453.6 g 1   Zoster Vaccine Adjuvanted [facility]) injection Inject 0.5 mLs into the muscle every 2 (two) months for 2 doses. 1 mL 0   No current facility-administered medications on file prior to visit.    ALLERGIES:  Allergies  Allergen Reactions   Penicillins Anaphylaxis and Rash    Has patient had a PCN reaction causing immediate rash, facial/tongue/throat swelling, SOB or lightheadedness with hypotension: Yes Has patient had a PCN reaction causing severe rash involving mucus membranes or skin necrosis: Yes Has patient had a PCN reaction that required hospitalization: Yes Has patient had a PCN reaction occurring within the last 10 years: No If all of the above answers are "NO", then may proceed with Cephalosporin use.    Codeine Rash     PHYSICAL EXAM:  Vitals:    12/26/21 1235  BP: 132/82  Pulse: 86  Resp: 16  Temp: 98.1 F (36.7 C)  SpO2: 100%   Filed Weights   12/26/21 1235  Weight: 190 lb 14.4 oz (86.6 kg)    General: Well-nourished, well-appearing female in no acute distress.  HEENT: Head is atraumatic and normocephalic.  Pupils equal and reactive to light. Conjunctivae clear without exudate.  Sclerae anicteric. Oral mucosa is pink and moist without lesions.  Tongue pink, moist, and midline. Oropharynx is pink and moist, without lesions. Lymph: No preauricular, postauricular, cervical, supraclavicular, or infraclavicular lymphadenopathy noted on palpation.   Neck: No palpable masses or adenopathy Cardiovascular: Normal rate and rhythm. Respiratory: Clear bilaterally. Chest expansion symmetric without accessory muscle use; breathing non-labored.  GI: Abdomen soft and round. Non-tender, non-distended. Bowel sounds normoactive.  Neuro: No focal deficits. Steady gait.   Psych: Normal mood and affect for situation. Extremities: No edema.  Skin: Warm and dry.    LABORATORY DATA:  None at this visit.  DIAGNOSTIC IMAGING:  None at this visit.    ASSESSMENT & PLAN:  Ms. Shelley Mason is a pleasant 56 y.o. female with history of left parotid cancer, diagnosed in 09/2017;  treated with surgery and adjuvant radiation; completed treatment on 11/06/2017.  Patient presents to survivorship clinic today for long-term follow-up  1.  Head and neck cancer:  Ms. Dedominicis is clinically without evidence of disease or recurrence on physical exam today.     2.  Sore throat: She reports some people at work have been diagnosed with strep recently.  She will call her PCP for testing.  I also discussed the sore throat, burning, and hoarseness may be from reflux.  I recommend to increase Protonix to BID and restart sucralfate once daily for 1 month.  I have a low suspicion this is cancer related and feel okay to hold on imaging for now.  3.  Tinnitus, vertigo: Ongoing.   Has been referred back to [facility] and is waiting to establish care.  Will defer management to ENT.  She may be a candidate for PT  4. Nutritional status: Ms. Yilmaz consumes a normal diet.  5. At risk for dysphagia: Given Ms. Schreier's treatment which included surgery and radiation therapy, she is at risk for chronic dysphagia.  She currently has none.  Consider SLP in the future if needed   6.  At risk for neck lymphedema: No evidence, no need for PT at this time  7.  At risk for hypothyroidism: Normal earlier this month, per PCP.   8. At risk for tooth decay/dental concerns: Under the care of Dr. Benson Norway, still needs 1 tooth pulled.  Notes that she cannot schedule until she is approved for Medicaid  9.  Lung cancer screening: Negative screening CT 07/05/2021, continue annually.  On medication for smoking cessation  10. Health maintenance and wellness promotion: Encouraged her to continue healthy active lifestyle, regular diet, hydration,  routine exercise, avoiding smoking and alcohol, and staying up-to-date on age-appropriate cancer screenings and health maintenance.     Dispo:  -See ENT as soon as she can get established -Phone visit with me in 3 months to follow-up sore throat, then annual visit in 12 months -Return to cancer center to see Dr. Isidore Moos as needed -Follow-up PCP   A total of 40 minutes was spent in the face-to-face care of this patient, with greater than 50% of that time spent in counseling and care-coordination.    [name], NP [facility] [phone]

## 2022-01-11 ENCOUNTER — Other Ambulatory Visit: Payer: Self-pay

## 2022-01-11 DIAGNOSIS — F419 Anxiety disorder, unspecified: Secondary | ICD-10-CM

## 2022-01-11 MED ORDER — CITALOPRAM HYDROBROMIDE 10 MG PO TABS: 10.0000 mg | ORAL_TABLET | Freq: Every day | ORAL | 3 refills | Status: DC

## 2022-01-14 ENCOUNTER — Other Ambulatory Visit: Payer: Self-pay

## 2022-01-14 ENCOUNTER — Encounter: Payer: Self-pay | Admitting: Family Medicine

## 2022-01-14 ENCOUNTER — Ambulatory Visit (INDEPENDENT_AMBULATORY_CARE_PROVIDER_SITE_OTHER): Payer: Commercial Managed Care - HMO | Admitting: Family Medicine

## 2022-01-14 VITALS — BP 135/91 | HR 86 | Wt 188.0 lb

## 2022-01-14 DIAGNOSIS — G4700 Insomnia, unspecified: Secondary | ICD-10-CM | POA: Diagnosis not present

## 2022-01-14 DIAGNOSIS — H9042 Sensorineural hearing loss, unilateral, left ear, with unrestricted hearing on the contralateral side: Secondary | ICD-10-CM

## 2022-01-14 DIAGNOSIS — F17210 Nicotine dependence, cigarettes, uncomplicated: Secondary | ICD-10-CM | POA: Diagnosis not present

## 2022-01-14 DIAGNOSIS — F419 Anxiety disorder, unspecified: Secondary | ICD-10-CM | POA: Diagnosis not present

## 2022-01-14 MED ORDER — TRAZODONE HCL 50 MG PO TABS: 25.0000 mg | ORAL_TABLET | Freq: Every evening | ORAL | 3 refills | Status: DC | PRN

## 2022-01-14 MED ORDER — MECLIZINE HCL 25 MG PO TABS: 25.0000 mg | ORAL_TABLET | Freq: Every day | ORAL | 0 refills | Status: DC | PRN

## 2022-01-14 NOTE — Progress Notes (Addendum)
    SUBJECTIVE:   CHIEF COMPLAINT: follow up medication change HPI:   Shelley Mason is a 56 y.o.  with history notable for L parotid cancer s/p resection, tobacco use, and anxiety presenting for follow up.  The patient was recently started on Citalopram. She reports ongoing anxiety. She is taking up to 3 pills per day. States she is sleep about 3 hours per night. Finds her mind racing at night. Has TV and cell phone in room--both are off.  During the day, she just 'goes and goes' at work. 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 No excess spending or EtOH use. She has seen several counselors, feels most tell her the same thing. She has previously tried Wellbutrin and Prozac without improvement in her symptoms.    In regards to her weight, it has stabilized. Labs reviewed. Reports her biggest barrier to food intake is her change in taste. She does have poor dentition and is hopeful she gets approved for Medicaid in December. No dysphagia, odynophagia, melena, hematochezia.   The patient reports her stressed has triggered increased tobacco use. Now up to 15 cigarettes per day.    PERTINENT  PMH / PSH/Family/Social History : updated and reviewed   OBJECTIVE:   BP (!) 135/91   Pulse 86   Wt 188 lb (85.3 kg)   SpO2 94%   BMI 32.27 kg/m   Today's weight:  Last Weight  Most recent update: 01/14/2022  8:34 AM    Weight  85.3 kg (188 lb)            Review of prior weights: Filed Weights   01/14/22 0834  Weight: 188 lb (85.3 kg)  HEENT R TM clear L TM with mild scarring  HEENT--poor dentition  Cardiac: Regular rate and rhythm. Normal S1/S2. No murmurs, rubs, or gallops appreciated. Lungs: Clear bilaterally to ascultation.  Abdomen: Normoactive bowel sounds. No tenderness to deep or light palpation. No rebound or guarding.  Psych: Pleasant and appropriate    ASSESSMENT/PLAN:   Cigarette smoker Encouraged cessation pre contemplative    Sensorineural hearing loss (SNHL) of left ear with unrestricted hearing of right ear Number given to reschedule with ENT (clinic cancelled visit)   Anxiety Question if component of other mood disorder (bipolar) Given worsening on SSRI will trial trazodone at night Follow up 1 month Discussed psychiatry and counseling--amenable to psychiatry if not improved at follow up    Weight change Now stabilized Labs reassuring UTD on age appropriate cancer screening  Monitor--suspect due to change in taste  HCM Declined influenza  At next visit consider Psychiatry referral if not improved and repeat direct LDL (just started statin)     Dorris Singh, MD  [facility]

## 2022-01-14 NOTE — Assessment & Plan Note (Signed)
Encouraged cessation pre contemplative

## 2022-01-14 NOTE — Assessment & Plan Note (Signed)
Number given to reschedule with ENT (clinic cancelled visit)

## 2022-01-14 NOTE — Patient Instructions (Addendum)
It was wonderful to see you today.  Please bring ALL of your medications with you to every visit.   Today we talked about:  -Starting Trazodone for sleep   - STOP Citalopram   - Follow up 1 month    We will keep an eye on your blood pressure    Call the ENT [facility] Address: [address] [place], [place], [place] [address] [place], [place] Address: [address] [place], [place] [address] Phone: [phone]  Please follow up in 1 months   Thank you for choosing [facility].   Please call [phone] with any questions about today's appointment.  Please be sure to schedule follow up at the front  desk before you leave today.   Dorris Singh, MD  Family Medicine

## 2022-01-14 NOTE — Assessment & Plan Note (Signed)
Question if component of other mood disorder (bipolar) Given worsening on SSRI will trial trazodone at night Follow up 1 month Discussed psychiatry and counseling--amenable to psychiatry if not improved at follow up

## 2022-02-12 ENCOUNTER — Other Ambulatory Visit: Payer: Self-pay | Admitting: *Deleted

## 2022-02-12 DIAGNOSIS — G4700 Insomnia, unspecified: Secondary | ICD-10-CM

## 2022-02-12 MED ORDER — TRAZODONE HCL 50 MG PO TABS: 25.0000 mg | ORAL_TABLET | Freq: Every evening | ORAL | 3 refills | Status: DC | PRN

## 2022-02-28 ENCOUNTER — Telehealth: Payer: Self-pay | Admitting: Nurse Practitioner

## 2022-02-28 NOTE — Telephone Encounter (Signed)
Left patient vm regarding appointment change

## 2022-03-08 ENCOUNTER — Telehealth: Payer: Self-pay | Admitting: Family Medicine

## 2022-03-08 NOTE — Telephone Encounter (Signed)
After-hours/emergency line call  Patient calls after-hours line reporting body aches, fever up to 103 F, chills, and upper respiratory symptoms that started yesterday.  She thinks she may have the flu.  Denies chest pain, shortness of breath.  Recommended that she take Tylenol as needed for fever and bodyaches and to stay well-hydrated.  Recommended that patient go to urgent care tomorrow for possible viral testing.

## 2022-03-11 ENCOUNTER — Other Ambulatory Visit: Payer: Self-pay | Admitting: Family Medicine

## 2022-03-11 DIAGNOSIS — K219 Gastro-esophageal reflux disease without esophagitis: Secondary | ICD-10-CM

## 2022-03-11 DIAGNOSIS — R1319 Other dysphagia: Secondary | ICD-10-CM

## 2022-03-28 ENCOUNTER — Telehealth: Payer: Commercial Managed Care - HMO | Admitting: Nurse Practitioner

## 2022-04-01 ENCOUNTER — Inpatient Hospital Stay: Payer: Commercial Managed Care - HMO | Attending: Nurse Practitioner | Admitting: Nurse Practitioner

## 2022-04-01 ENCOUNTER — Encounter: Payer: Self-pay | Admitting: Nurse Practitioner

## 2022-04-01 DIAGNOSIS — Z85818 Personal history of malignant neoplasm of other sites of lip, oral cavity, and pharynx: Secondary | ICD-10-CM

## 2022-04-01 NOTE — Progress Notes (Signed)
Patient Care Team: Shelley Malay, MD as PCP - General (Family Medicine) Shelley Mason as Social Worker   I connected with Shelley Mason on 04/01/22 at  9:30 AM EST by telephone visit and verified that I am speaking with the correct person using two identifiers.   I discussed the limitations, risks, security and privacy concerns of performing an evaluation and management service by telemedicine and the availability of in-person appointments. I also discussed with the patient that there may be a patient responsible charge related to this service. The patient expressed understanding and agreed to proceed.   Other persons participating in the visit and their role in the encounter: None   Patient's location: Work  Provider's location: Psychologist, occupational Complaint: F/up sore throat   CURRENT THERAPY: Surveillance   INTERVAL HISTORY Shelley Mason presents by phone to follow up sore throat that she was experiencing at survivorship visit on 12/26/21. She saw new ENT at [facility] 01/29/22 (Shelley Mason) and exam was normal. She was offered neuro and PT referrals for dizziness/vertigo but declined. Since then, 2 weeks ago she had flu and stomach virus from which she has recovered, and sore throat resolved.   The main thing she wants to discuss today is the recent passing of her long term partner of 10 years to overdose. She got the call while at work last Monday, resuscitative efforts were pursued, he was on a vent until he passed 1/25. His family, who have not been involved in their life for 10 years, are coming out now and treating her "horribly," keeping her out of arrangements etc. Shelley Mason has her own battles with anxiety/mental health, but remains positive with strong family support and continues working. Denies SI. Has not informed PCP yet.    Past Medical History:  Diagnosis Date   Anemia    history of   Anxiety    Cigarette smoker    Depression    Fibroids    history of  uterine fibroids   GERD (gastroesophageal reflux disease)    Hearing loss    Hearing loss    History of blood transfusion    History of radiation therapy 09/22/17- 11/06/17   tumor bed left neck with added margin o facial nerve.    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(HCC)    Vertigo      Past Surgical History:  Procedure Laterality Date   CESAREAN SECTION     x 3   COLONOSCOPY     CYSTOSCOPY     LAPAROSCOPIC ASSISTED VAGINAL HYSTERECTOMY     MULTIPLE EXTRACTIONS WITH ALVEOLOPLASTY N/A 04/05/2021   Procedure: MULTIPLE EXTRACTION WITH ALVEOLOPLASTY;  Surgeon: Shelley Mason, DMD;  Location: [facility];  Service: Dentistry;  Laterality: N/A;   PAROTIDECTOMY Left    TUBAL LIGATION       Outpatient Encounter Medications as of 04/01/2022  Medication Sig   atorvastatin (LIPITOR) 20 MG tablet Take 1 tablet (20 mg total) by mouth at bedtime.   meclizine (ANTIVERT) 25 MG tablet Take 1 tablet (25 mg total) by mouth daily as needed for dizziness.   nicotine polacrilex (NICORETTE) 4 MG gum Take 1 each (4 mg total) by mouth as needed for smoking cessation.   omeprazole (PRILOSEC) 40 MG capsule TAKE 1 CAPSULE (40 MG TOTAL) BY MOUTH DAILY.   sucralfate (CARAFATE) 1 g tablet Take 1 tablet (1  g total) by mouth 2 (two) times daily as needed. (Patient not taking: Reported on 04/03/2021)   traZODone (DESYREL) 50 MG tablet Take 0.5-1 tablets (25-50 mg total) by mouth at bedtime as needed for sleep.   triamcinolone ointment (KENALOG) 0.1 % Apply 1 application. topically 2 (two) times daily as needed (eczema).   No facility-administered encounter medications on file as of 04/01/2022.     There were no vitals filed for this visit. There is no height or weight on file to calculate BMI.   Patient appears well over the phone. Voice is strong, speech is clear, coherent, non pressured. No hoarseness, cough, or conversational dyspnea   LABS No lab date for  this visit    ASSESSMENT & PLAN: 57 yo female   Mental health/emotional support -Untimely passing of her partner of 10 years to overdose last week. Pt dealing with undue stress from strained relationship with his family but coping well. Emotional support provided. She gave me permission to let her PCP know the situation.   2.  Sore throat: resolved. Saw Shelley Mason at [facility] 01/29/22, exam WNL  3. Head and neck cancer:  NED on most recent exam 01/29/22. Long term survivorship visit 12/2022   4.  Tinnitus, vertigo: ?inner ear damage from radiation. Offered neuro and PT referrals but pt declined at the time    PLAN: -Sore throat resolved, no further work up needed -Emotional support provided for untimely passing of her partner, will let PCP know who manages pt's mental health -Oncology long term survivorship f/up 12/2022, or sooner if needed     I discussed the assessment and treatment plan with the patient. The patient was provided an opportunity to ask questions and all were answered. The patient agreed with the plan and demonstrated an understanding of the instructions.   The patient was advised to call back or seek an in-person evaluation if the symptoms worsen or if the condition fails to improve as anticipated. The total time spent in the appointment was 11 minutes and more than 50% was on counseling, chart review, and coordination of care.   Shelley Rue, NP-C 04/01/2022

## 2022-05-16 NOTE — Progress Notes (Deleted)
    SUBJECTIVE:   Chief compliant/HPI: annual examination  Shelley Mason is a 57 y.o. who presents today for an annual exam.    History tabs reviewed and updated ***.   Review of systems form reviewed and notable for ***.   OBJECTIVE:   There were no vitals taken for this visit.  ***  ASSESSMENT/PLAN:   No problem-specific Assessment & Plan notes found for this encounter.    Annual Examination  See AVS for age appropriate recommendations  PHQ score ***, reviewed and discussed.  BP reviewed and at goal ***.  Asked about intimate partner violence and resources given as appropriate  Advance directives discussion ***  Considered the following items based upon USPSTF recommendations: Diabetes screening: {discussed/ordered:14545} Screening for elevated cholesterol: {discussed/ordered:14545} HIV testing: {discussed/ordered:14545} Hepatitis C: {discussed/ordered:14545} Hepatitis B: {discussed/ordered:14545} Syphilis if at high risk: {discussed/ordered:14545} GC/CT {GC/CT screening :23818} Osteoporosis screening considered based upon risk of fracture from [facility] calculator. Major osteoporotic fracture risk is ***%. DEXA {ordered not order:23822}.  Reviewed risk factors for latent tuberculosis and {not indicated/requested/declined:14582}  Cervical cancer screening: not indicated given history of hysterectomy with prior normal cytology.  Breast cancer screening: discussed potential benefits, risks including overdiagnosis and biopsy, elected proceed with mammogram Colorectal cancer screening: {crcscreen:23821::"discussed, colonoscopy ordered"} Lung cancer screening: {discussed/declined/written info:19698}. See documentation below regarding indications/risks/benefits.  Vaccinations ***.   Follow up in 1 *** year or sooner if indicated.    Martyn Malay, MD [place]

## 2022-05-17 ENCOUNTER — Ambulatory Visit: Payer: Medicaid Other | Admitting: Family Medicine

## 2022-05-17 DIAGNOSIS — Z0000 Encounter for general adult medical examination without abnormal findings: Secondary | ICD-10-CM

## 2022-05-17 DIAGNOSIS — Z122 Encounter for screening for malignant neoplasm of respiratory organs: Secondary | ICD-10-CM

## 2022-05-17 DIAGNOSIS — Z1231 Encounter for screening mammogram for malignant neoplasm of breast: Secondary | ICD-10-CM

## 2022-05-17 DIAGNOSIS — R7303 Prediabetes: Secondary | ICD-10-CM

## 2022-05-17 DIAGNOSIS — E785 Hyperlipidemia, unspecified: Secondary | ICD-10-CM

## 2022-05-23 NOTE — Progress Notes (Signed)
    SUBJECTIVE:   CHIEF COMPLAINT: follow up HPI:   Shelley Mason is a 57 y.o.  with history notable for salivary gland tumor, chronic dizziness, and tobacco abuse  presenting for follow up.   The patient reports significant stress. Spouse of 10 years passed away. She was excluded from decision making. She saw a counselor once, did not find it helpful. Has support through family. No SI/HI. Still grieving but back to work. Weight is stable. Sleep is still poor.  She reports smoking more. 1 ppd. Tried the gum without success. She is interested in quitting. This does provide stress relief.  She reports itchy eyes and watery nose. No fevers, chills, dyspnea. Thinks it is her allergies. No cough or hemoptysis.   BP today is elevated. Reports she was rushing here and is stress. No CP or HA. No prior significant elevations.   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 This has resulted in significant limitations due to vertigo and tinnitus.    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 Normal S1/S2. No murmurs, rubs, or gallops appreciated. Lungs: Clear bilaterally to ascultation.  Abdomen: Normoactive bowel sounds.  Psych: Pleasant and appropriate    ASSESSMENT/PLAN:   Cigarette smoker Discussed at length Patches and lozenges provided  Will send to [facility] maintenance Low dose CT  ordered Received flu and covid this fall    Obesity (BMI 35.0-39.9 without comorbidity) Weight is stable  Lipid and A1C today    Elevated BP Likely due to stress Follow up 2 weeks for repeat  Grief reaction Supportive listening provided Offered therapy resources through insurance, declined     Dorris Singh, MD  [place]

## 2022-05-27 ENCOUNTER — Encounter: Payer: Self-pay | Admitting: Family Medicine

## 2022-05-27 ENCOUNTER — Ambulatory Visit (INDEPENDENT_AMBULATORY_CARE_PROVIDER_SITE_OTHER): Payer: 59 | Admitting: Family Medicine

## 2022-05-27 ENCOUNTER — Other Ambulatory Visit: Payer: Self-pay

## 2022-05-27 ENCOUNTER — Other Ambulatory Visit (HOSPITAL_COMMUNITY): Payer: Self-pay

## 2022-05-27 VITALS — BP 142/86 | HR 81 | Ht 64.0 in | Wt 191.0 lb

## 2022-05-27 DIAGNOSIS — F17210 Nicotine dependence, cigarettes, uncomplicated: Secondary | ICD-10-CM | POA: Diagnosis not present

## 2022-05-27 DIAGNOSIS — Z1231 Encounter for screening mammogram for malignant neoplasm of breast: Secondary | ICD-10-CM

## 2022-05-27 DIAGNOSIS — F17209 Nicotine dependence, unspecified, with unspecified nicotine-induced disorders: Secondary | ICD-10-CM

## 2022-05-27 DIAGNOSIS — Z0000 Encounter for general adult medical examination without abnormal findings: Secondary | ICD-10-CM | POA: Diagnosis not present

## 2022-05-27 DIAGNOSIS — E669 Obesity, unspecified: Secondary | ICD-10-CM | POA: Diagnosis not present

## 2022-05-27 MED ORDER — KETOTIFEN FUMARATE 0.025 % OP SOLN: 1.0000 [drp] | Freq: Two times a day (BID) | OPHTHALMIC | 1 refills | Status: DC

## 2022-05-27 MED ORDER — TRIAMCINOLONE ACETONIDE 0.1 % EX OINT: 1.0000 | TOPICAL_OINTMENT | Freq: Two times a day (BID) | CUTANEOUS | 1 refills | Status: DC | PRN

## 2022-05-27 MED ORDER — FLUTICASONE PROPIONATE 50 MCG/ACT NA SUSP: 2.0000 | Freq: Every day | NASAL | 6 refills | Status: DC

## 2022-05-27 MED ORDER — NICOTINE 21 MG/24HR TD PT24
21.0000 mg | MEDICATED_PATCH | Freq: Every morning | TRANSDERMAL | 2 refills | Status: DC
  Filled 2022-05-27 (×2): qty 14, 14d supply, fill #0

## 2022-05-27 MED ORDER — NICOTINE POLACRILEX 4 MG MT LOZG
4.0000 mg | LOZENGE | OROMUCOSAL | 2 refills | Status: DC | PRN
  Filled 2022-05-27 (×2): qty 72, 9d supply, fill #0

## 2022-05-27 MED ORDER — CETIRIZINE HCL 10 MG PO TABS: 10.0000 mg | ORAL_TABLET | Freq: Every day | ORAL | 1 refills | Status: AC

## 2022-05-27 NOTE — Assessment & Plan Note (Signed)
Weight is stable

## 2022-05-27 NOTE — Patient Instructions (Addendum)
It was wonderful to see you today.  Please bring ALL of your medications with you to every visit.   Today we talked about:  For your smoking--- -- Go to the [facility] to pick these up Address: [address], [place], [place] [address]  -- You can use the patches daily--please return in 1 month and we will decrease the dose  - You can take the patch off at night if it causes nightmares   For your allergies - I sent in a nasal spray--use daily 2 sprays - I also sent in a pill to take daily--watch this as it can cause dry mouth  - I also sent in eye drops   I sent in your cream for your hands     Please follow up in 2 weeks to check your blood pressure and we will change your patches   Thank you for choosing [facility].   Please call [phone] with any questions about today's appointment.  Please be sure to schedule follow up at the front  desk before you leave today.   Dorris Singh, MD  Family Medicine

## 2022-05-27 NOTE — Assessment & Plan Note (Signed)
Discussed at length Patches and lozenges provided  Will send to [facility]

## 2022-05-27 NOTE — Assessment & Plan Note (Addendum)
Low dose CT ordered Received flu and covid this fall  Mammogram ordered-due in July

## 2022-05-28 ENCOUNTER — Telehealth: Payer: Self-pay | Admitting: Family Medicine

## 2022-05-28 LAB — LIPID PANEL
Chol/HDL Ratio: 3.7 ratio (ref 0.0–4.4)
Cholesterol, Total: 224 mg/dL — ABNORMAL HIGH (ref 100–199)
HDL: 61 mg/dL (ref >39)
LDL Chol Calc (NIH): 150 mg/dL — ABNORMAL HIGH (ref 0–99)
Triglycerides: 73 mg/dL (ref 0–149)
VLDL Cholesterol Cal: 13 mg/dL (ref 5–40)

## 2022-05-28 LAB — HEMOGLOBIN A1C
Est. average glucose Bld gHb Est-mCnc: 117 mg/dL
Hgb A1c MFr Bld: 5.7 % — ABNORMAL HIGH (ref 4.8–5.6)

## 2022-05-28 NOTE — Telephone Encounter (Signed)
Attempted to call patient. Reached voicemail, left generic voicemail to call back.  If patient calls back - Overall labs look good - Her A1C is still 'prediabetic' She does not need a medication for this. She can take over the counter vitamin D 1,000 mg if she would like (may help prevent diabetes)  - Her cholesterol is still quite high. Please ask if she is taking Lipitor. If she is taking, I recommend increasing the dose and repeating in 4-6 weeks.    Dorris Singh, MD  Family Medicine Teaching Service

## 2022-05-30 ENCOUNTER — Telehealth: Payer: Self-pay | Admitting: *Deleted

## 2022-05-30 NOTE — Telephone Encounter (Signed)
Per [facility] pt needs PA for Triamcinolone 0.1% 454G. Please advise. Adenike Shidler Kennon Holter, CMA

## 2022-05-31 ENCOUNTER — Other Ambulatory Visit (HOSPITAL_COMMUNITY): Payer: Self-pay

## 2022-05-31 NOTE — Telephone Encounter (Signed)
A Prior Authorization was initiated for this patients TRIAMCINOLONE OINTMENT through CoverMyMeds.   Key: [id number]

## 2022-06-05 ENCOUNTER — Other Ambulatory Visit (HOSPITAL_COMMUNITY): Payer: Self-pay

## 2022-06-05 NOTE — Telephone Encounter (Signed)
  Received NOTICE from [facility] regarding Prior Authorization for TRIAMCINOLONE OINTMENT .   Authorization has been DENIED due to We have denied your request because it is for more than the amount your plan covers (quantity limit). Your plan only covers more of this drug when you meet the  quantities. We have partially APPROVED your request for this drug up to the amount your plan covers (180gm per month). Your request for more drug has been DENIED..  I ran a test claim for 180mg  per 30 as stated from insurance DENIAL  copay of $32.60    PLEASE BE ADVISED  [facility] [phone]

## 2022-06-07 ENCOUNTER — Other Ambulatory Visit: Payer: Self-pay | Admitting: Family Medicine

## 2022-06-07 DIAGNOSIS — F17210 Nicotine dependence, cigarettes, uncomplicated: Secondary | ICD-10-CM

## 2022-06-12 NOTE — Progress Notes (Signed)
Ok I will work on the [place].  Yarielys Beed,CMA

## 2022-06-12 NOTE — Progress Notes (Unsigned)
LMOVM informing pt of appointment. Treavon Castilleja Bruna Potter, CMA

## 2022-06-13 ENCOUNTER — Ambulatory Visit (HOSPITAL_COMMUNITY): Payer: 59

## 2022-06-14 ENCOUNTER — Other Ambulatory Visit (HOSPITAL_COMMUNITY): Payer: Self-pay

## 2022-07-12 ENCOUNTER — Inpatient Hospital Stay: Admission: RE | Admit: 2022-07-12 | Payer: 59 | Source: Ambulatory Visit

## 2022-08-15 ENCOUNTER — Ambulatory Visit
Admission: RE | Admit: 2022-08-15 | Discharge: 2022-08-15 | Disposition: A | Discharge status: Home / Self Care | Payer: 59 | Source: Ambulatory Visit | Attending: Family Medicine | Admitting: Family Medicine

## 2022-08-15 DIAGNOSIS — F17210 Nicotine dependence, cigarettes, uncomplicated: Secondary | ICD-10-CM | POA: Diagnosis not present

## 2022-08-21 ENCOUNTER — Telehealth: Payer: Self-pay | Admitting: Family Medicine

## 2022-08-21 NOTE — Telephone Encounter (Signed)
Called and discussed CT findings. Already on a statin. Discussed smoking cessation. All questions answered Terisa Starr, MD  [facility] Medicine Teaching Service

## 2022-10-16 NOTE — Progress Notes (Unsigned)
    SUBJECTIVE:   CHIEF COMPLAINT: sore throat HPI:   Shelley Mason is a 57 y.o.  with history notable for L parotid cancer s/p resection and radiation and tobacco use presenting for sore throat.   HTN  Has had multiple elevated blood pressures in clinic. Reports she has a home cuff that is automatic and has lower values. She would like to avoid medication. No HA, CP or dyspnea.   Mood  Reports work is going well. Has some anxious thoughts at nighttime which limit sleep but overall doing better. Recently lost her uncle (lost her partner in winter time). Reports her mood is okay, has great support. Not interested in medication or counseling.   Sore Throat  Reports 2-3 months of a sore throat. This started 2 months ago as a painful sore throat. She thought she might have Strep. The soreness is improved but she feels her throat is tight and sore. No dysphagia, odynophagia, recent infections, or viral symptoms. Has had 10 pounds of weight loss by our scale since March. She reports she is really focusing on her eating and doing a lot better with this. Avoiding greasy and fried foods. No nightsweats, swollen 'glands', or coughing.   Tobacco use Interested in slowly cutting down. Had difficulty getting patches and lozenges.   PERTINENT  PMH / PSH/Family/Social History :  Parotid cancer s/p resection and radiation Mood disorder Tobacco use  OBJECTIVE:   BP (!) 147/75   Pulse 84   Ht 5\' 4"  (1.626 m)   Wt 181 lb 3.2 oz (82.2 kg)   SpO2 100%   BMI 31.10 kg/m   Today's weight:  Last Weight  Most recent update: 10/17/2022  9:02 AM    Weight  82.2 kg (181 lb 3.2 oz)            Review of prior weights: [employer]   10/17/22 0902  Weight: 181 lb 3.2 oz (82.2 kg)    HEENT Deformity of L ear due to surgery TM intact bilaterally Fullness and scar tissue along L neck, no palpable lymph node, enlarged from prior  Cardiac: Regular rate and rhythm. Normal S1/S2. No murmurs, rubs,  or gallops appreciated. Lungs: Clear bilaterally to ascultation.  Abdomen: Normoactive bowel sounds. No tenderness to deep or light palpation. No rebound or guarding.   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

## 2022-10-17 ENCOUNTER — Ambulatory Visit (INDEPENDENT_AMBULATORY_CARE_PROVIDER_SITE_OTHER): Payer: 59 | Admitting: Family Medicine

## 2022-10-17 ENCOUNTER — Encounter: Payer: Self-pay | Admitting: Family Medicine

## 2022-10-17 ENCOUNTER — Other Ambulatory Visit: Payer: Self-pay

## 2022-10-17 ENCOUNTER — Telehealth: Payer: Self-pay

## 2022-10-17 VITALS — BP 147/75 | HR 84 | Ht 64.0 in | Wt 181.2 lb

## 2022-10-17 DIAGNOSIS — J029 Acute pharyngitis, unspecified: Secondary | ICD-10-CM

## 2022-10-17 DIAGNOSIS — L301 Dyshidrosis [pompholyx]: Secondary | ICD-10-CM

## 2022-10-17 DIAGNOSIS — R221 Localized swelling, mass and lump, neck: Secondary | ICD-10-CM

## 2022-10-17 DIAGNOSIS — R634 Abnormal weight loss: Secondary | ICD-10-CM | POA: Diagnosis not present

## 2022-10-17 DIAGNOSIS — F17210 Nicotine dependence, cigarettes, uncomplicated: Secondary | ICD-10-CM

## 2022-10-17 LAB — POCT RAPID STREP A (OFFICE): Rapid Strep A Screen: NEGATIVE

## 2022-10-17 MED ORDER — NICOTINE 21 MG/24HR TD PT24: 21.0000 mg | MEDICATED_PATCH | Freq: Every morning | TRANSDERMAL | 2 refills | Status: AC

## 2022-10-17 MED ORDER — NICOTINE POLACRILEX 4 MG MT LOZG: 4.0000 mg | LOZENGE | OROMUCOSAL | 2 refills | Status: AC | PRN

## 2022-10-17 MED ORDER — TRIAMCINOLONE ACETONIDE 0.1 % EX OINT
1.0000 | TOPICAL_OINTMENT | Freq: Two times a day (BID) | CUTANEOUS | 1 refills | Status: AC | PRN
Start: 2022-10-17 — End: ?

## 2022-10-17 NOTE — Assessment & Plan Note (Signed)
Discussed cessation Printed  Rx for patches and lozenges for [facility]

## 2022-10-17 NOTE — Patient Instructions (Addendum)
It was wonderful to see you today.  Please bring ALL of your medications with you to every visit.   Today we talked about:  - Bring 10-15 home readings of your blood pressure to your next visit - Bring your cuff to follow up  Take your prescriptions to the [facility]  You will be called about a CT  I will message you with blood work   Please follow up in 1 months for blood pressure   Thank you for choosing [facility].   Please call [phone] with any questions about today's appointment.  Please be sure to schedule follow up at the front  desk before you leave today.   Terisa Starr, MD  Family Medicine

## 2022-10-17 NOTE — Telephone Encounter (Signed)
Patient calls nurse line requesting a work note.   She reports she would like to return to work on 8/22.  She reports she would like to get her blood pressure under control before she returns.   Advised will forward to PCP.

## 2022-10-18 LAB — CBC WITH DIFFERENTIAL/PLATELET
Basophils Absolute: 0 10*3/uL (ref 0.0–0.2)
Basos: 1 %
EOS (ABSOLUTE): 0.1 10*3/uL (ref 0.0–0.4)
Eos: 2 %
Hematocrit: 41.3 % (ref 34.0–46.6)
Hemoglobin: 13.9 g/dL (ref 11.1–15.9)
Immature Grans (Abs): 0 10*3/uL (ref 0.0–0.1)
Immature Granulocytes: 0 %
Lymphocytes Absolute: 2.1 10*3/uL (ref 0.7–3.1)
Lymphs: 35 %
MCH: 31.2 pg (ref 26.6–33.0)
MCHC: 33.7 g/dL (ref 31.5–35.7)
MCV: 93 fL (ref 79–97)
Monocytes Absolute: 0.4 10*3/uL (ref 0.1–0.9)
Monocytes: 7 %
Neutrophils Absolute: 3.3 10*3/uL (ref 1.4–7.0)
Neutrophils: 55 %
Platelets: 266 10*3/uL (ref 150–450)
RBC: 4.45 x10E6/uL (ref 3.77–5.28)
RDW: 13.4 % (ref 11.7–15.4)
WBC: 5.9 10*3/uL (ref 3.4–10.8)

## 2022-10-18 LAB — COMPREHENSIVE METABOLIC PANEL
ALT: 12 IU/L (ref 0–32)
AST: 18 IU/L (ref 0–40)
Albumin: 4.4 g/dL (ref 3.8–4.9)
Alkaline Phosphatase: 81 IU/L (ref 44–121)
BUN/Creatinine Ratio: 15 (ref 9–23)
BUN: 12 mg/dL (ref 6–24)
Bilirubin Total: 0.3 mg/dL (ref 0.0–1.2)
CO2: 22 mmol/L (ref 20–29)
Calcium: 9.4 mg/dL (ref 8.7–10.2)
Chloride: 105 mmol/L (ref 96–106)
Creatinine, Ser: 0.81 mg/dL (ref 0.57–1.00)
Globulin, Total: 2.6 g/dL (ref 1.5–4.5)
Glucose: 84 mg/dL (ref 70–99)
Potassium: 4.6 mmol/L (ref 3.5–5.2)
Sodium: 142 mmol/L (ref 134–144)
Total Protein: 7 g/dL (ref 6.0–8.5)
eGFR: 86 mL/min/{1.73_m2} (ref >59)

## 2022-10-18 LAB — TSH RFX ON ABNORMAL TO FREE T4: TSH: 1.17 u[IU]/mL (ref 0.450–4.500)

## 2022-10-18 NOTE — Telephone Encounter (Signed)
Letter is in letters tab. Please schedule follow up next week for nurse BP check with her home monitor to compare values.  Terisa Starr, MD  Family Medicine Teaching Service

## 2022-10-18 NOTE — Telephone Encounter (Signed)
Tried to call patient but no answer, will try again later. Penni Bombard CMA

## 2022-10-21 NOTE — Telephone Encounter (Signed)
LVM for patient that letter is ready for pick up and to schedule a nurse visit for her BP. Penni Bombard CMA

## 2022-10-22 ENCOUNTER — Ambulatory Visit (HOSPITAL_COMMUNITY)
Admission: RE | Admit: 2022-10-22 | Discharge: 2022-10-22 | Disposition: A | Discharge status: Home / Self Care | Payer: 59 | Source: Ambulatory Visit | Attending: Family Medicine | Admitting: Family Medicine

## 2022-10-22 DIAGNOSIS — J029 Acute pharyngitis, unspecified: Secondary | ICD-10-CM | POA: Insufficient documentation

## 2022-10-22 DIAGNOSIS — R221 Localized swelling, mass and lump, neck: Secondary | ICD-10-CM | POA: Insufficient documentation

## 2022-10-22 DIAGNOSIS — M47812 Spondylosis without myelopathy or radiculopathy, cervical region: Secondary | ICD-10-CM | POA: Diagnosis not present

## 2022-10-22 DIAGNOSIS — K118 Other diseases of salivary glands: Secondary | ICD-10-CM | POA: Diagnosis not present

## 2022-10-22 MED ORDER — IOHEXOL 350 MG/ML SOLN
75.0000 mL | Freq: Once | INTRAVENOUS | Status: AC | PRN
  Administered 2022-10-22: 75 mL via INTRAVENOUS

## 2022-10-23 ENCOUNTER — Ambulatory Visit (INDEPENDENT_AMBULATORY_CARE_PROVIDER_SITE_OTHER): Payer: 59

## 2022-10-23 VITALS — BP 150/89 | HR 80

## 2022-10-23 DIAGNOSIS — Z0130 Encounter for examination of blood pressure without abnormal findings: Secondary | ICD-10-CM

## 2022-10-23 DIAGNOSIS — R030 Elevated blood-pressure reading, without diagnosis of hypertension: Secondary | ICD-10-CM

## 2022-10-28 NOTE — Progress Notes (Signed)
Patient presents to nurse clinic for BP check.   Manual BP in left arm: 138/80, HR:80, SpO2: 99%.   Patient also brought home BP machine for calibration. Home BP reading: 150/89. Advised patient that cuff appears to be inaccurate and advised to stop monitoring with this cuff.   Spoke with Dr. Manson Passey who recommended that patient follow up on BP in about 1 month and she will send in new order for home BP machine.   Advised patient of provider's instructions.  Scheduled patient follow up appointment on 11/22/22.  Veronda Prude, RN

## 2022-10-29 NOTE — Addendum Note (Signed)
Addended by: Manson Passey, Aleksandra Raben on: 10/29/2022 09:17 AM   Modules accepted: Orders

## 2022-10-29 NOTE — Progress Notes (Signed)
Patient has need for DME. I have ordered  BP cuff  . I am routing note for [facility] RN Pool.   Westley Chandler, MD

## 2022-11-08 ENCOUNTER — Telehealth: Payer: Self-pay | Admitting: Family Medicine

## 2022-11-08 NOTE — Telephone Encounter (Signed)
Attempted to call patient. Reached voicemail, left generic voicemail to call back.  If she calls back please let her know ct looks good! No recurrence of disease---the tissue she is feeling is just more scar tissue and some extra neck tissue (adipose).   Terisa Starr, MD  Family Medicine Teaching Service

## 2022-11-22 ENCOUNTER — Ambulatory Visit: Payer: 59 | Admitting: Family Medicine

## 2022-11-22 ENCOUNTER — Ambulatory Visit (INDEPENDENT_AMBULATORY_CARE_PROVIDER_SITE_OTHER): Payer: Medicare Other | Admitting: Family Medicine

## 2022-11-22 ENCOUNTER — Encounter: Payer: Self-pay | Admitting: Family Medicine

## 2022-11-22 VITALS — BP 137/87 | HR 88 | Ht 64.0 in | Wt 178.2 lb

## 2022-11-22 DIAGNOSIS — H1013 Acute atopic conjunctivitis, bilateral: Secondary | ICD-10-CM

## 2022-11-22 DIAGNOSIS — H1010 Acute atopic conjunctivitis, unspecified eye: Secondary | ICD-10-CM | POA: Insufficient documentation

## 2022-11-22 DIAGNOSIS — E785 Hyperlipidemia, unspecified: Secondary | ICD-10-CM | POA: Diagnosis not present

## 2022-11-22 DIAGNOSIS — R030 Elevated blood-pressure reading, without diagnosis of hypertension: Secondary | ICD-10-CM | POA: Insufficient documentation

## 2022-11-22 MED ORDER — KETOTIFEN FUMARATE 0.035 % OP SOLN: 1.0000 [drp] | Freq: Two times a day (BID) | OPHTHALMIC | 0 refills | Status: DC | PRN

## 2022-11-22 MED ORDER — BLOOD PRESSURE MONITOR AUTOMAT DEVI: 0 refills | Status: AC

## 2022-11-22 NOTE — Assessment & Plan Note (Signed)
No active erythema Ketotifen refilled F/U with PCP if symptoms persists despite treatment

## 2022-11-22 NOTE — Assessment & Plan Note (Signed)
BP looks great today BP machine escribed Diet and exercise counseling provided F/U with PCP for routine care

## 2022-11-22 NOTE — Assessment & Plan Note (Signed)
Nonadherent to her Lipitor Continue diet and exercise control for now F/U with PCP soon for reassessment

## 2022-11-22 NOTE — Patient Instructions (Signed)

## 2022-11-22 NOTE — Progress Notes (Addendum)
    SUBJECTIVE:   CHIEF COMPLAINT / HPI:   HTN: Here for BP check. Has not been able to check her BP at home as she does not have a machine. No other concerns  HLD: Not taking her Lipitor. Diet controlled.  Itchy eyes: C/O daily itchy eyes. This occurs during the day and nighttime. Worsened with pollens exposure.   PERTINENT  PMH / PSH: PMHx reviewed  OBJECTIVE:   BP 137/87   Pulse 88   Ht 5\' 4"  (1.626 m)   Wt 178 lb 3.2 oz (80.8 kg)   SpO2 100%   BMI 30.59 kg/m   Physical Exam Vitals and nursing note reviewed.  Constitutional:      Appearance: Normal appearance.  Eyes:     Extraocular Movements: Extraocular movements intact.     Conjunctiva/sclera: Conjunctivae normal.     Pupils: Pupils are equal, round, and reactive to light.  Cardiovascular:     Rate and Rhythm: Normal rate and regular rhythm.     Heart sounds: Normal heart sounds. No murmur heard. Pulmonary:     Effort: Pulmonary effort is normal. No respiratory distress.     Breath sounds: Normal breath sounds. No wheezing.      ASSESSMENT/PLAN:   Elevated blood pressure reading without diagnosis of hypertension BP looks great today BP machine escribed Diet and exercise counseling provided F/U with PCP for routine care  Hyperlipidemia Nonadherent to her Lipitor Continue diet and exercise control for now F/U with PCP soon for reassessment  Allergic conjunctivitis No active erythema Ketotifen refilled F/U with PCP if symptoms persists despite treatment    Flu shot offered, but declined.  Janit Pagan, MD [facility]

## 2022-11-28 ENCOUNTER — Ambulatory Visit
Admission: RE | Admit: 2022-11-28 | Discharge: 2022-11-28 | Disposition: A | Discharge status: Home / Self Care | Payer: 59 | Source: Ambulatory Visit | Attending: Family Medicine | Admitting: Family Medicine

## 2022-11-28 DIAGNOSIS — Z1231 Encounter for screening mammogram for malignant neoplasm of breast: Secondary | ICD-10-CM | POA: Diagnosis not present

## 2022-11-29 ENCOUNTER — Encounter: Payer: Self-pay | Admitting: Family Medicine

## 2022-12-30 ENCOUNTER — Inpatient Hospital Stay: Payer: 59 | Admitting: Hematology

## 2022-12-30 ENCOUNTER — Telehealth: Payer: Self-pay

## 2022-12-30 ENCOUNTER — Telehealth: Payer: Self-pay | Admitting: Hematology

## 2022-12-30 NOTE — Telephone Encounter (Signed)
Spoke w/pt via telephone to let pt know her appt scheduled for today w/Dr. Mosetta Putt is cancelled d/t pt is on survivorship and doesn't need to be seen again until January 2025 by Santiago Glad, NP.  Pt verbalized understanding and had no further questions or concerns at this time.  Notified [facility] Scheduling to reschedule pt to January 2025 w/Lacie Azucena Cecil, NP or Vincent Gros, DNP.

## 2022-12-31 ENCOUNTER — Other Ambulatory Visit: Payer: Self-pay | Admitting: Family Medicine

## 2022-12-31 MED ORDER — KETOTIFEN FUMARATE 0.035 % OP SOLN: 1.0000 [drp] | Freq: Two times a day (BID) | OPHTHALMIC | 0 refills | Status: AC | PRN

## 2023-01-01 ENCOUNTER — Other Ambulatory Visit: Payer: Self-pay

## 2023-01-01 MED ORDER — MECLIZINE HCL 25 MG PO TABS: 25.0000 mg | ORAL_TABLET | Freq: Every day | ORAL | 0 refills | Status: AC | PRN

## 2023-03-17 ENCOUNTER — Inpatient Hospital Stay: Payer: Medicaid Other | Admitting: Nurse Practitioner

## 2023-07-22 ENCOUNTER — Other Ambulatory Visit: Payer: Self-pay | Admitting: Family Medicine

## 2023-07-22 DIAGNOSIS — Z1231 Encounter for screening mammogram for malignant neoplasm of breast: Secondary | ICD-10-CM
